# Patient Record
Sex: Male | Born: 1940 | Race: Black or African American | Hispanic: No | Marital: Single | Smoking: Current every day smoker
Health system: Southern US, Community
[De-identification: ages and names within clinical notes are randomized; demographics above are authoritative.]

## PROBLEM LIST (undated history)

## (undated) DIAGNOSIS — E785 Hyperlipidemia, unspecified: Secondary | ICD-10-CM

## (undated) DIAGNOSIS — Z720 Tobacco use: Secondary | ICD-10-CM

## (undated) DIAGNOSIS — E119 Type 2 diabetes mellitus without complications: Secondary | ICD-10-CM

## (undated) DIAGNOSIS — I10 Essential (primary) hypertension: Secondary | ICD-10-CM

## (undated) DIAGNOSIS — J984 Other disorders of lung: Secondary | ICD-10-CM

## (undated) DIAGNOSIS — N400 Enlarged prostate without lower urinary tract symptoms: Secondary | ICD-10-CM

## (undated) DIAGNOSIS — I071 Rheumatic tricuspid insufficiency: Secondary | ICD-10-CM

## (undated) DIAGNOSIS — B351 Tinea unguium: Secondary | ICD-10-CM

## (undated) DIAGNOSIS — L309 Dermatitis, unspecified: Secondary | ICD-10-CM

## (undated) HISTORY — DX: Hyperlipidemia, unspecified: E78.5

## (undated) HISTORY — DX: Essential (primary) hypertension: I10

## (undated) HISTORY — DX: Dermatitis, unspecified: L30.9

## (undated) HISTORY — DX: Rheumatic tricuspid insufficiency: I07.1

## (undated) HISTORY — DX: Other disorders of lung: J98.4

## (undated) HISTORY — DX: Tobacco use: Z72.0

## (undated) HISTORY — DX: Type 2 diabetes mellitus without complications: E11.9

## (undated) HISTORY — DX: Benign prostatic hyperplasia without lower urinary tract symptoms: N40.0

## (undated) HISTORY — DX: Tinea unguium: B35.1

---

## 1992-10-15 DIAGNOSIS — Z720 Tobacco use: Secondary | ICD-10-CM | POA: Insufficient documentation

## 1998-04-27 ENCOUNTER — Ambulatory Visit (HOSPITAL_COMMUNITY): Admission: RE | Admit: 1998-04-27 | Discharge: 1998-04-27 | Payer: Self-pay | Admitting: Internal Medicine

## 1999-10-28 DIAGNOSIS — E785 Hyperlipidemia, unspecified: Secondary | ICD-10-CM | POA: Insufficient documentation

## 2000-11-21 ENCOUNTER — Encounter: Admission: RE | Admit: 2000-11-21 | Discharge: 2000-11-21 | Payer: Self-pay | Admitting: Internal Medicine

## 2000-11-21 ENCOUNTER — Encounter: Payer: Self-pay | Admitting: Internal Medicine

## 2001-02-12 ENCOUNTER — Ambulatory Visit (HOSPITAL_COMMUNITY): Admission: RE | Admit: 2001-02-12 | Discharge: 2001-02-12 | Payer: Self-pay | Admitting: Internal Medicine

## 2002-05-19 ENCOUNTER — Encounter: Payer: Self-pay | Admitting: Internal Medicine

## 2002-05-19 ENCOUNTER — Encounter: Admission: RE | Admit: 2002-05-19 | Discharge: 2002-05-19 | Payer: Self-pay | Admitting: Internal Medicine

## 2002-08-29 ENCOUNTER — Ambulatory Visit (HOSPITAL_COMMUNITY): Admission: RE | Admit: 2002-08-29 | Discharge: 2002-08-29 | Payer: Self-pay | Admitting: Gastroenterology

## 2003-07-17 ENCOUNTER — Encounter: Admission: RE | Admit: 2003-07-17 | Discharge: 2003-07-17 | Payer: Self-pay | Admitting: Internal Medicine

## 2003-09-15 ENCOUNTER — Encounter: Admission: RE | Admit: 2003-09-15 | Discharge: 2003-09-15 | Payer: Self-pay | Admitting: Internal Medicine

## 2003-11-13 ENCOUNTER — Encounter: Admission: RE | Admit: 2003-11-13 | Discharge: 2003-11-13 | Payer: Self-pay | Admitting: Cardiology

## 2003-11-17 ENCOUNTER — Encounter: Admission: RE | Admit: 2003-11-17 | Discharge: 2003-11-17 | Payer: Self-pay | Admitting: Cardiology

## 2003-11-20 HISTORY — PX: CARDIAC CATHETERIZATION: SHX172

## 2003-12-18 ENCOUNTER — Encounter (INDEPENDENT_AMBULATORY_CARE_PROVIDER_SITE_OTHER): Payer: Self-pay | Admitting: Specialist

## 2003-12-18 ENCOUNTER — Ambulatory Visit (HOSPITAL_COMMUNITY): Admission: RE | Admit: 2003-12-18 | Discharge: 2003-12-18 | Payer: Self-pay | Admitting: Urology

## 2004-01-13 DIAGNOSIS — N138 Other obstructive and reflux uropathy: Secondary | ICD-10-CM | POA: Insufficient documentation

## 2004-01-13 DIAGNOSIS — N401 Enlarged prostate with lower urinary tract symptoms: Secondary | ICD-10-CM

## 2004-03-22 ENCOUNTER — Ambulatory Visit (HOSPITAL_COMMUNITY): Admission: RE | Admit: 2004-03-22 | Discharge: 2004-03-22 | Payer: Self-pay | Admitting: Urology

## 2004-09-23 IMAGING — CT CT BIOPSY
1 of 5 series · 12 of 32 positions shown, 18 images · non-contrast
Comparison: none

CLINICAL DATA: Left renal mass.
 CT GUIDED BIOPSY ? 12/18/03
 Written informed consent was obtained from the patient for the procedure and sedation.  The patient received IV Versed and fentanyl for conscious sedation.  Cardiorespiratory monitoring was performed throughout the procedure by the radiology nurse.
 The patient was placed prone on the CT gantry and limited CT was performed to localize the left renal lesion.  On today?s noncontrasted study, the lesion is hyperdense. 
 The left flank was prepped and draped in sterile fashion and anesthetized with 1% lidocaine.  Under CT fluoroscopic guidance, a 19 gauge outer Easy-core needle was advanced to the edge of the renal lesion.  Two separate fine needle aspirations were obtained at this point.  The material aspirated from the lesion was mucus-like and thick.  Quick-stain of these samples showed mucoid material and cystic debris.  Four to five core biopsies were then obtained coaxially.  However, these produced very little material with the samples fragmenting.
 IMPRESSION
 Successful fine needle aspiration and core biopsy of the left renal lesion.

[Series 3: — · axial · 0.68mm/px · z∈[+1244,+1340]mm · 12 of 24 slices shown, 18 images]
[im 2/24  soft-tissue]
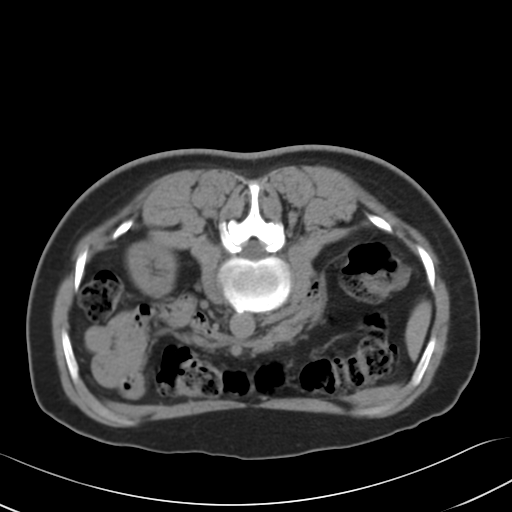
[im 2/24  bone]
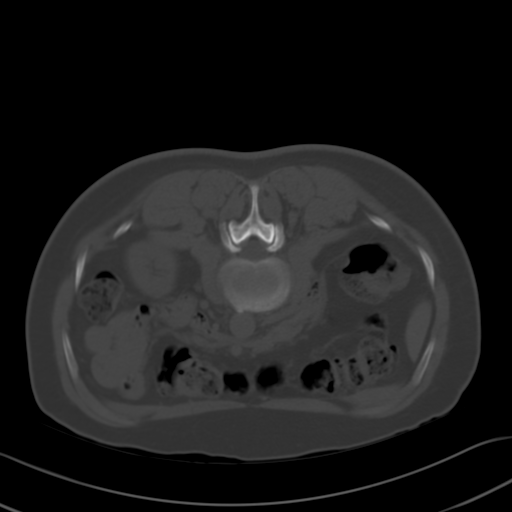
[im 4/24  soft-tissue]
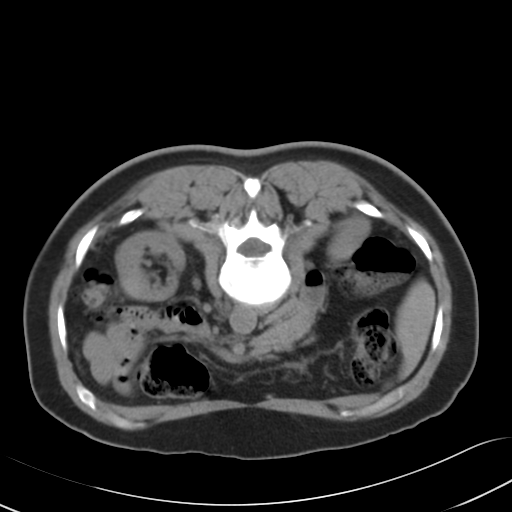
[im 6/24  soft-tissue]
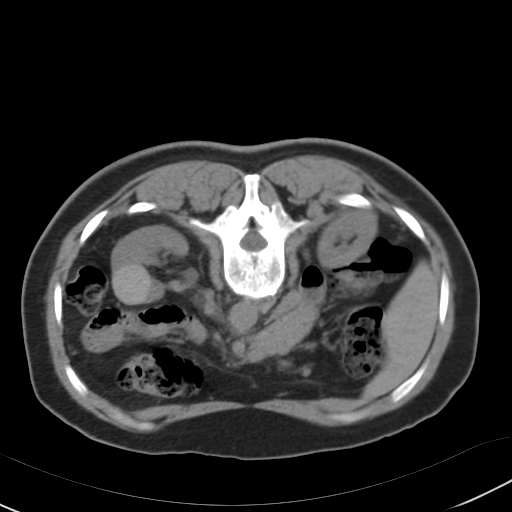
[im 8/24  soft-tissue]
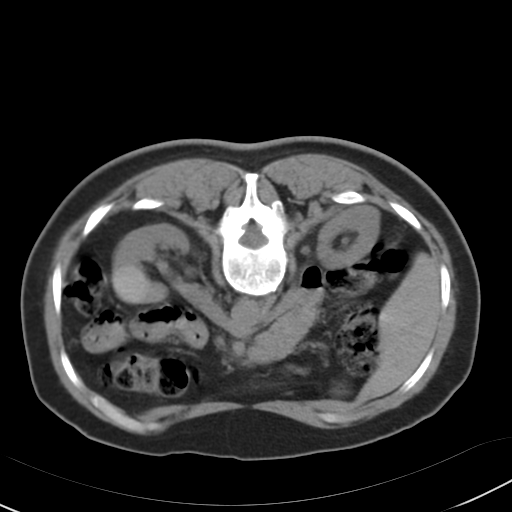
[im 9/24  soft-tissue]
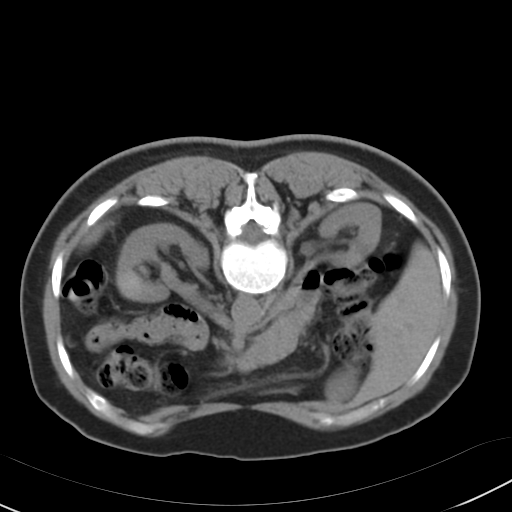
[im 11/24  soft-tissue]
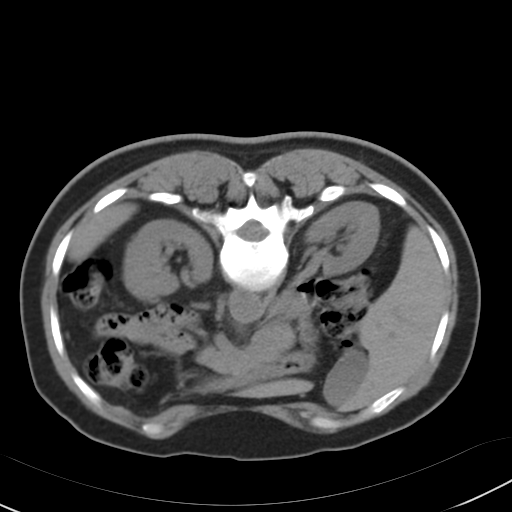
[im 13/24  soft-tissue]
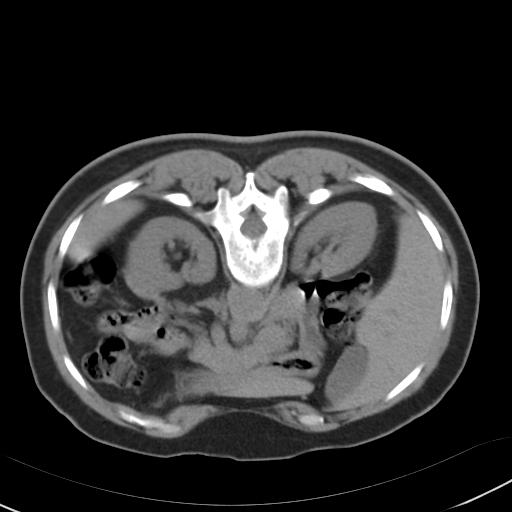
[im 15/24  soft-tissue]
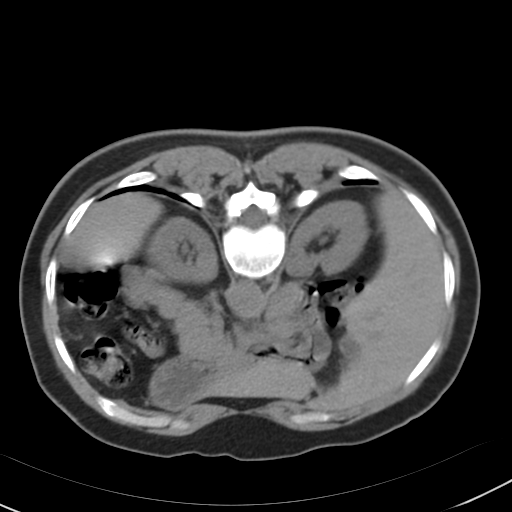
[im 16/24  soft-tissue]
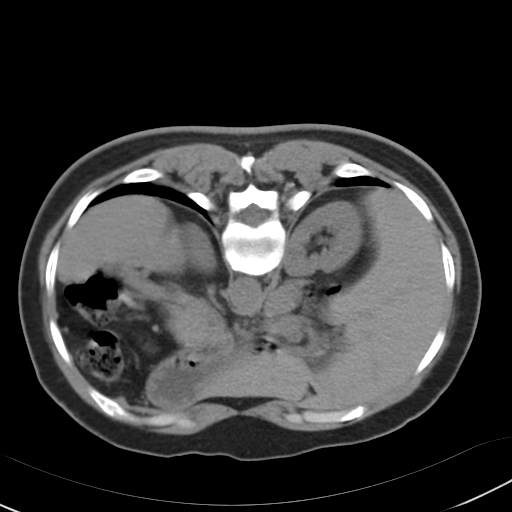
[im 16/24  lung]
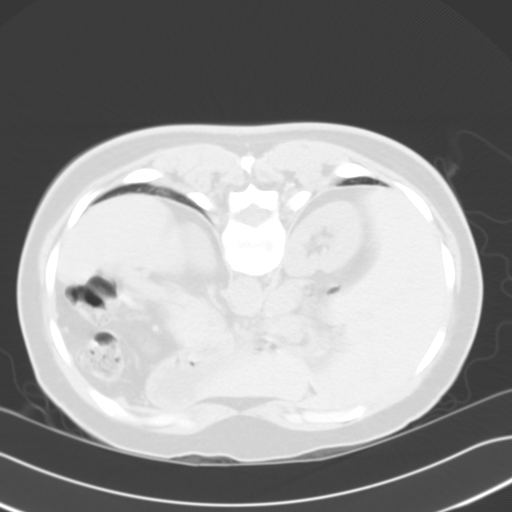
[im 16/24  bone]
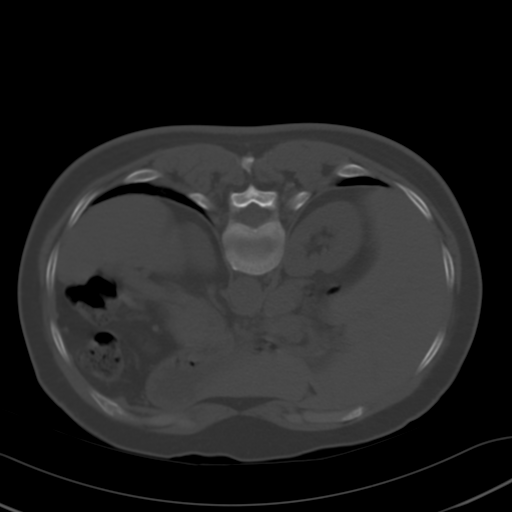
[im 18/24  soft-tissue]
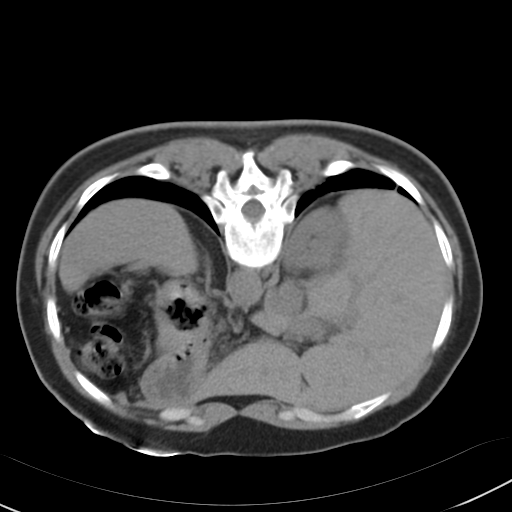
[im 18/24  lung]
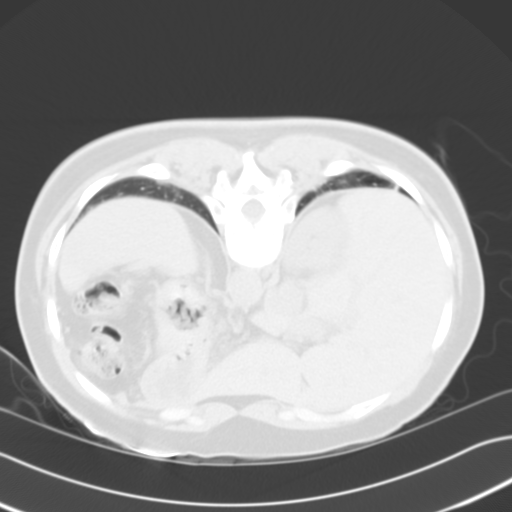
[im 20/24  soft-tissue]
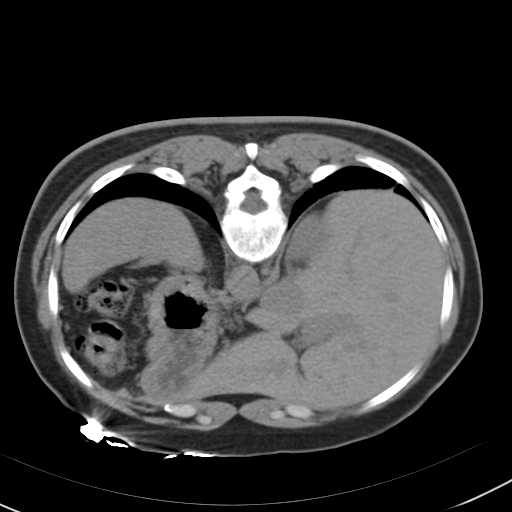
[im 20/24  lung]
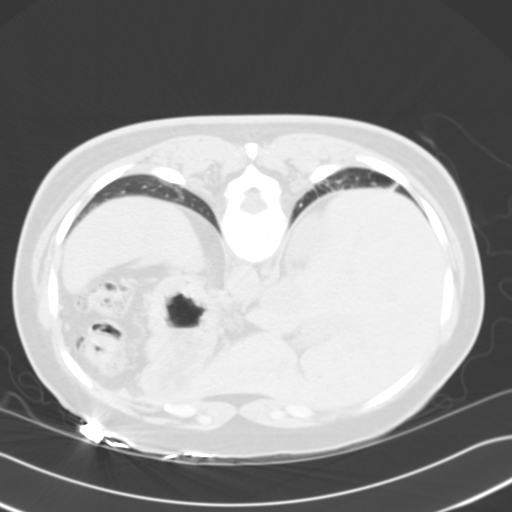
[im 22/24  soft-tissue]
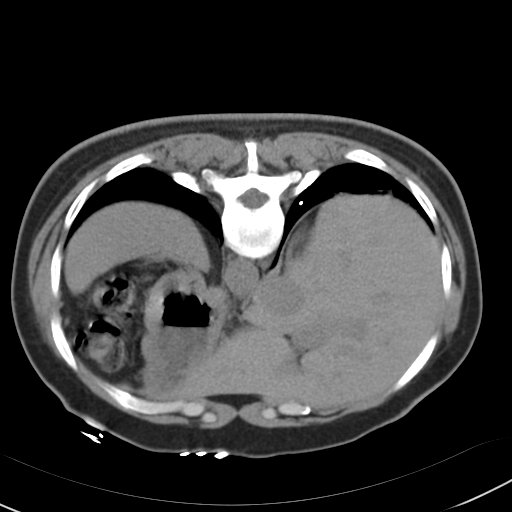
[im 22/24  lung]
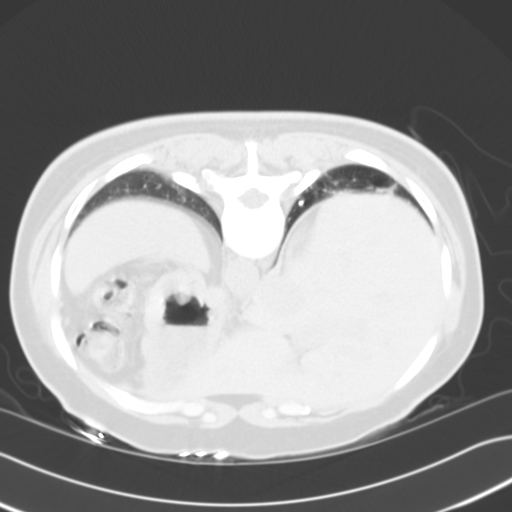

[12 of 32 positions shown; findings below may reference images not displayed]

## 2008-11-18 ENCOUNTER — Encounter: Admission: RE | Admit: 2008-11-18 | Discharge: 2008-11-18 | Payer: Self-pay | Admitting: Internal Medicine

## 2009-08-17 DIAGNOSIS — E559 Vitamin D deficiency, unspecified: Secondary | ICD-10-CM | POA: Insufficient documentation

## 2009-09-23 HISTORY — PX: US ECHOCARDIOGRAPHY: HXRAD669

## 2010-04-14 ENCOUNTER — Ambulatory Visit: Payer: Self-pay | Admitting: Surgery

## 2010-04-14 ENCOUNTER — Encounter: Payer: Self-pay | Admitting: Internal Medicine

## 2010-04-14 ENCOUNTER — Ambulatory Visit (HOSPITAL_COMMUNITY): Admission: RE | Admit: 2010-04-14 | Discharge: 2010-04-14 | Payer: Self-pay | Admitting: Internal Medicine

## 2010-10-02 ENCOUNTER — Encounter: Payer: Self-pay | Admitting: Urology

## 2011-01-13 ENCOUNTER — Encounter: Payer: Self-pay | Admitting: Internal Medicine

## 2011-01-13 DIAGNOSIS — E119 Type 2 diabetes mellitus without complications: Secondary | ICD-10-CM | POA: Insufficient documentation

## 2011-01-13 DIAGNOSIS — I10 Essential (primary) hypertension: Secondary | ICD-10-CM | POA: Insufficient documentation

## 2011-01-27 NOTE — Op Note (Signed)
   NAME:  Chase Mccarthy, Chase Mccarthy NO.:  [id number]   MEDICAL RECORD NO.:  [id number]                   PATIENT TYPE:  AMB   LOCATION:  ENDO                                 FACILITY:  [facility]   PHYSICIAN:  Matilde C. Madilyn Fireman, M.D.                 DATE OF BIRTH:  June 29, 1941   DATE OF PROCEDURE:  08/29/2002  DATE OF DISCHARGE:                                 OPERATIVE REPORT   PROCEDURE:  Colonoscopy.   INDICATIONS FOR PROCEDURE:  Colon cancer screening in a 70 year old patient  with no previous screening.   DESCRIPTION OF PROCEDURE:  The patient was placed in the left lateral  decubitus position then placed on the pulse monitor with continuous low flow  oxygen delivered by nasal cannula. He was sedated with 50 mcg IV fentanyl  and 5 mg IV Versed. The Olympus video colonoscope was inserted into the  rectum and advanced to the cecum, confirmed by transillumination at  McBurney's point and visualization of the ileocecal valve and appendiceal  orifice. The prep was good. The cecum, ascending, transverse, descending and  sigmoid colon all appeared normal with no masses, polyps, diverticula or  other mucosal abnormalities. The rectum likewise appeared normal and  retroflexed view of the anus revealed no obvious internal hemorrhoids. The  colonoscope was then withdrawn and the patient returned to the recovery room  in stable condition. The patient tolerated the procedure well and there were  no immediate complications.   IMPRESSION:  Normal screening colonoscopy.   PLAN:  Repeat study in five years.                                               Ethridge C. Madilyn Fireman, M.D.    [facility]/MEDQ  D:  08/29/2002  T:  08/29/2002  Job:  [id number]   cc:   Minerva Areola L. August Saucer, M.D.  [address] [id number]  [place]  [place] [address]  Fax: [phone]

## 2012-03-27 HISTORY — PX: NM MYOCAR PERF WALL MOTION: HXRAD629

## 2012-09-18 LAB — HM COLONOSCOPY

## 2012-11-14 ENCOUNTER — Encounter: Payer: Self-pay | Admitting: Internal Medicine

## 2012-11-14 DIAGNOSIS — E119 Type 2 diabetes mellitus without complications: Secondary | ICD-10-CM | POA: Insufficient documentation

## 2012-11-14 DIAGNOSIS — I10 Essential (primary) hypertension: Secondary | ICD-10-CM | POA: Insufficient documentation

## 2012-11-14 DIAGNOSIS — B351 Tinea unguium: Secondary | ICD-10-CM | POA: Insufficient documentation

## 2012-11-14 DIAGNOSIS — J984 Other disorders of lung: Secondary | ICD-10-CM | POA: Insufficient documentation

## 2012-11-26 ENCOUNTER — Ambulatory Visit (INDEPENDENT_AMBULATORY_CARE_PROVIDER_SITE_OTHER): Payer: Medicare Other | Admitting: Emergency Medicine

## 2012-11-26 VITALS — BP 117/67 | HR 83 | Temp 97.9°F | Resp 18 | Wt 176.0 lb

## 2012-11-26 DIAGNOSIS — M79609 Pain in unspecified limb: Secondary | ICD-10-CM

## 2012-11-26 DIAGNOSIS — IMO0002 Reserved for concepts with insufficient information to code with codable children: Secondary | ICD-10-CM

## 2012-11-26 DIAGNOSIS — L02511 Cutaneous abscess of right hand: Secondary | ICD-10-CM

## 2012-11-26 MED ORDER — SULFAMETHOXAZOLE-TMP DS 800-160 MG PO TABS: 1.0000 | ORAL_TABLET | Freq: Two times a day (BID) | ORAL | Status: DC

## 2012-11-26 NOTE — Progress Notes (Signed)
Digital block to L thumb.  Betadine prep and #11 blade used to make a very small incision under his nail. Purulence expressed  Drsg placed.

## 2012-11-26 NOTE — Patient Instructions (Signed)
Wound care d/w pt. Continue daily soaks.

## 2012-11-26 NOTE — Progress Notes (Signed)
[facility] [address], [place] [address] [phone]  Date:  11/26/2012   Name:  Chase Mccarthy   DOB:  [birth year]   MRN:  [id number]  PCP:  Willey Blade, MD    Chief Complaint: Hand Pain   History of Present Illness:  Chase Mccarthy is a 72 y.o. very pleasant male patient who presents with the following:  Has a week long history of tender swollen left thumb on the terminal phalange on the radial side. History of similar on the other thumb two years ago.  No known injury. Denies other complaint or health concern today.   Patient Active Problem List  Diagnosis  . Essential hypertension  . Diabetes mellitus type 2 in nonobese  . Tobacco abuse  . Hyperlipidemia LDL goal < 100  . Vitamin D deficiency  . Benign prostatic hypertrophy with urinary obstruction  . Hypertension  . Diabetes mellitus  . Restrictive lung disease  . Onychomycosis    Past Medical History  Diagnosis Date  . Hypertension     stable  . Diabetes mellitus   . Tobacco use   . Restrictive lung disease   . Dermatitis     axillary  . Onychomycosis   . BPH (benign prostatic hyperplasia)     mild    No past surgical history on file.  History  Substance Use Topics  . Smoking status: Current Every Day Smoker -- 20 years    Types: Cigars  . Smokeless tobacco: Never Used     Comment: 3 cigars daily  . Alcohol Use: No    No family history on file.  No Known Allergies  Medication list has been reviewed and updated.  Current Outpatient Prescriptions on File Prior to Visit  Medication Sig Dispense Refill  . amLODipine (NORVASC) 2.5 MG tablet Take 2.5 mg by mouth daily.        Marland Kitchen aspirin 81 MG EC tablet Take 81 mg by mouth daily.        Marland Kitchen dutasteride (AVODART) 0.5 MG capsule Take 0.5 mg by mouth daily.        . fish oil-omega-3 fatty acids 1000 MG capsule Take 2 g by mouth daily.      . fluocinonide (LIDEX) 0.05 % cream Apply 1 application topically 2 (two) times daily.         Marland Kitchen glimepiride (AMARYL) 1 MG tablet Take 1 mg by mouth daily before breakfast.        . lisinopril (PRINIVIL,ZESTRIL) 10 MG tablet Take 10 mg by mouth daily.      Marland Kitchen oxybutynin (DITROPAN) 5 MG tablet Take 5 mg by mouth 3 (three) times daily.        Marland Kitchen terbinafine (LAMISIL) 250 MG tablet Take 250 mg by mouth daily.       No current facility-administered medications on file prior to visit.    Review of Systems:  As per HPI, otherwise negative.    Physical Examination: Filed Vitals:   11/26/12 1701  BP: 117/67  Pulse: 83  Temp: 97.9 F (36.6 C)  Resp: 18   Filed Vitals:   11/26/12 1701  Weight: 176 lb (79.833 kg)   There is no height on file to calculate BMI. Ideal Body Weight:     GEN: WDWN, NAD, Non-toxic, Alert & Oriented x 3 HEENT: Atraumatic, Normocephalic.  Ears and Nose: No external deformity. EXTR: No clubbing/cyanosis/edema NEURO: Normal gait.  PSYCH: Normally interactive. Conversant. Not depressed or anxious  appearing.  Calm demeanor.  RIGHT HAND:  Felon right thumb  Assessment and Plan: Felon right thumb I&D   septra

## 2013-06-23 ENCOUNTER — Ambulatory Visit: Payer: Self-pay | Admitting: Cardiovascular Disease

## 2013-07-11 ENCOUNTER — Ambulatory Visit: Payer: Self-pay | Admitting: Cardiovascular Disease

## 2013-08-21 ENCOUNTER — Encounter: Payer: Self-pay | Admitting: *Deleted

## 2013-08-22 ENCOUNTER — Ambulatory Visit (INDEPENDENT_AMBULATORY_CARE_PROVIDER_SITE_OTHER): Payer: Medicare Other | Admitting: Cardiovascular Disease

## 2013-08-22 ENCOUNTER — Encounter: Payer: Self-pay | Admitting: Cardiovascular Disease

## 2013-08-22 VITALS — BP 120/70 | HR 60 | Resp 16 | Ht 72.0 in | Wt 169.0 lb

## 2013-08-22 DIAGNOSIS — Z720 Tobacco use: Secondary | ICD-10-CM

## 2013-08-22 DIAGNOSIS — F17200 Nicotine dependence, unspecified, uncomplicated: Secondary | ICD-10-CM

## 2013-08-22 DIAGNOSIS — E119 Type 2 diabetes mellitus without complications: Secondary | ICD-10-CM

## 2013-08-22 DIAGNOSIS — E785 Hyperlipidemia, unspecified: Secondary | ICD-10-CM

## 2013-08-22 NOTE — Patient Instructions (Signed)
Your physician recommends that you schedule a follow-up appointment in: 6 months. Your physician discussed the hazards of tobacco use. Tobacco use cessation is recommended and techniques and options to help you quit were discussed.

## 2013-09-05 ENCOUNTER — Encounter: Payer: Self-pay | Admitting: Cardiovascular Disease

## 2013-09-05 NOTE — Assessment & Plan Note (Addendum)
Fair lipid control. His LDL cholesterol is very close to goal and institution of pharmacological therapy does not appear to be justified at this time especially since he does not have coronary disease by previous angiography (2005). On the other hand his lipids were better about a year ago and the difference appears to be less attention to a healthy diet

## 2013-09-05 NOTE — Assessment & Plan Note (Signed)
Excellent blood pressure control 

## 2013-09-05 NOTE — Assessment & Plan Note (Signed)
Excellent control.   

## 2013-09-05 NOTE — Progress Notes (Signed)
Patient ID: Chase Mccarthy, male   DOB: March 03, 1941, 72 y.o.   MRN: [id number]     Reason for office visit Multiple coronary risk factors  Mr. Kropf has not had any serious health challenges since his last office visit. He continues to be bleeding and his diabetes control is excellent. His blood pressure is also very good. A repeat lipid profile was performed last month. The results are not too bad with an LDL cholesterol of 105 and normal total cholesterol triglycerides and HDL levels. On the other hand in 2012 his LDL has been as low as 77. He admits he has not been quite as careful with his diet.   No Known Allergies  Current Outpatient Prescriptions  Medication Sig Dispense Refill  . amLODipine (NORVASC) 2.5 MG tablet Take 2.5 mg by mouth daily.        Marland Kitchen aspirin 81 MG EC tablet Take 81 mg by mouth daily.        Marland Kitchen dutasteride (AVODART) 0.5 MG capsule Take 0.5 mg by mouth daily.        . fish oil-omega-3 fatty acids 1000 MG capsule Take 2 g by mouth daily.      . fluocinonide (LIDEX) 0.05 % cream Apply 1 application topically 2 (two) times daily.        Marland Kitchen glimepiride (AMARYL) 1 MG tablet Take 1 mg by mouth daily before breakfast.        . lisinopril (PRINIVIL,ZESTRIL) 10 MG tablet Take 10 mg by mouth daily.      Marland Kitchen oxybutynin (DITROPAN) 5 MG tablet Take 5 mg by mouth 3 (three) times daily.        Marland Kitchen terbinafine (LAMISIL) 250 MG tablet Take 250 mg by mouth daily.       No current facility-administered medications for this visit.    Past Medical History  Diagnosis Date  . Hypertension     stable  . Diabetes mellitus   . Tobacco use   . Restrictive lung disease   . Dermatitis     axillary  . Onychomycosis   . BPH (benign prostatic hyperplasia)     mild  . Dyslipidemia   . Tricuspid valve regurgitation     Past Surgical History  Procedure Laterality Date  . Cardiac catheterization  11/20/2003    normal coronaries  . US echocardiography  09/23/2009    mild-mod LVH,EF =>55%,mild  MVP,trace MR,mild to mod TR, AOV mildly sclerotic.  Marland Kitchen Nm myocar perf wall motion  03/27/2012    normal    Family History  Problem Relation Age of Onset  . Heart failure Mother   . Cancer Father     History   Social History  . Marital Status: Single    Spouse Name: N/A    Number of Children: N/A  . Years of Education: N/A   Occupational History  . Not on file.   Social History Main Topics  . Smoking status: Current Every Day Smoker -- 20 years    Types: Cigars  . Smokeless tobacco: Never Used     Comment: 3 cigars daily  . Alcohol Use: No  . Drug Use: No  . Sexual Activity: Not Currently   Other Topics Concern  . Not on file   Social History Narrative  . No narrative on file    Review of systems: The patient specifically denies any chest pain at rest or with exertion, dyspnea at rest or with exertion, orthopnea, paroxysmal nocturnal dyspnea, syncope, palpitations, focal  neurological deficits, intermittent claudication, lower extremity edema, unexplained weight gain, cough, hemoptysis or wheezing.  The patient also denies abdominal pain, nausea, vomiting, dysphagia, diarrhea, constipation, polyuria, polydipsia, dysuria, hematuria, frequency, urgency, abnormal bleeding or bruising, fever, chills, unexpected weight changes, mood swings, change in skin or hair texture, change in voice quality, auditory or visual problems, allergic reactions or rashes, new musculoskeletal complaints other than usual "aches and pains".   PHYSICAL EXAM BP 120/70  Pulse 60  Resp 16  Ht 6' (1.829 m)  Wt 169 lb (76.658 kg)  BMI 22.92 kg/m2  General: Alert, oriented x3, no distress Head: no evidence of trauma, PERRL, EOMI, no exophtalmos or lid lag, no myxedema, no xanthelasma; normal ears, nose and oropharynx Neck: normal jugular venous pulsations and no hepatojugular reflux; brisk carotid pulses without delay and no carotid bruits Chest: clear to auscultation, no signs of consolidation by  percussion or palpation, normal fremitus, symmetrical and full respiratory excursions Cardiovascular: normal position and quality of the apical impulse, regular rhythm, normal first and second heart sounds, no murmurs, rubs or gallops Abdomen: no tenderness or distention, no masses by palpation, no abnormal pulsatility or arterial bruits, normal bowel sounds, no hepatosplenomegaly Extremities: no clubbing, cyanosis or edema; 2+ radial, ulnar and brachial pulses bilaterally; 2+ right femoral, posterior tibial and dorsalis pedis pulses; 2+ left femoral, posterior tibial and dorsalis pedis pulses; no subclavian or femoral bruits Neurological: grossly nonfocal   EKG: Normal sinus rhythm, normal trach  Lipid Panel  Total cholesterol 181, triglycerides 122, HDL 52, LDL 105  BMET Sodium 137, potassium 3.7, glucose 114, creatinine 1.05 Hemoglobin A1c 6% Labs from 07/25/2013   ASSESSMENT AND PLAN Tobacco abuse Strongly encouraged to completely discontinue smoking due to the risk of cardiac and lung problems and increased risk of head and neck cancer.  Hyperlipidemia LDL goal < 100 Fair lipid control. His LDL cholesterol is very close to goal and institution of additional therapy does not appear to be justified at this time especially since he does not have coronary disease by previous angiography (2005).  Diabetes mellitus Excellent control  Essential hypertension Excellent blood pressure control   Patient Instructions  Your physician recommends that you schedule a follow-up appointment in: 6 months. Your physician discussed the hazards of tobacco use. Tobacco use cessation is recommended and techniques and options to help you quit were discussed.       Junious Silk, MD, [facility] [phone] office [phone] pager

## 2013-09-05 NOTE — Assessment & Plan Note (Signed)
Strongly encouraged to completely discontinue smoking due to the risk of cardiac and lung problems and increased risk of head and neck cancer.

## 2013-10-19 ENCOUNTER — Ambulatory Visit: Payer: Medicare HMO

## 2013-10-19 ENCOUNTER — Ambulatory Visit (INDEPENDENT_AMBULATORY_CARE_PROVIDER_SITE_OTHER): Payer: Medicare HMO | Admitting: Emergency Medicine

## 2013-10-19 VITALS — BP 108/64 | HR 68 | Temp 97.9°F | Resp 18 | Ht 72.0 in | Wt 165.2 lb

## 2013-10-19 DIAGNOSIS — M25579 Pain in unspecified ankle and joints of unspecified foot: Secondary | ICD-10-CM

## 2013-10-19 DIAGNOSIS — M79609 Pain in unspecified limb: Secondary | ICD-10-CM

## 2013-10-19 DIAGNOSIS — E119 Type 2 diabetes mellitus without complications: Secondary | ICD-10-CM

## 2013-10-19 DIAGNOSIS — M79676 Pain in unspecified toe(s): Secondary | ICD-10-CM

## 2013-10-19 DIAGNOSIS — S92919A Unspecified fracture of unspecified toe(s), initial encounter for closed fracture: Secondary | ICD-10-CM

## 2013-10-19 LAB — POCT CBC
Granulocyte percent: 54.3 %G (ref 37–80)
HEMATOCRIT: 42.8 % — AB (ref 43.5–53.7)
HEMOGLOBIN: 13.2 g/dL — AB (ref 14.1–18.1)
LYMPH, POC: 1.7 (ref 0.6–3.4)
MCH, POC: 29.3 pg (ref 27–31.2)
MCHC: 30.8 g/dL — AB (ref 31.8–35.4)
MCV: 95 fL (ref 80–97)
MID (CBC): 0.5 (ref 0–0.9)
MPV: 9 fL (ref 0–99.8)
POC GRANULOCYTE: 2.6 (ref 2–6.9)
POC LYMPH PERCENT: 34.9 %L (ref 10–50)
POC MID %: 10.8 % (ref 0–12)
Platelet Count, POC: 242 10*3/uL (ref 142–424)
RBC: 4.51 M/uL — AB (ref 4.69–6.13)
RDW, POC: 13.6 %
WBC: 4.8 10*3/uL (ref 4.6–10.2)

## 2013-10-19 NOTE — Patient Instructions (Signed)
Toe Fracture  Your caregiver has diagnosed you as having a fractured toe. A toe fracture is a break in the bone of a toe. "Buddy taping" is a way of splinting your broken toe, by taping the broken toe to the toe next to it. This "buddy taping" will keep the injured toe from moving beyond normal range of motion. Buddy taping also helps the toe heal in a more normal alignment. It may take 6 to 8 weeks for the toe injury to heal.  HOME CARE INSTRUCTIONS    Leave your toes taped together for as long as directed by your caregiver or until you see a doctor for a follow-up examination. You can change the tape after bathing. Always use a small piece of gauze or cotton between the toes when taping them together. This will help the skin stay dry and prevent infection.   Apply ice to the injury for 15-20 minutes each hour while awake for the first 2 days. Put the ice in a plastic bag and place a towel between the bag of ice and your skin.   After the first 2 days, apply heat to the injured area. Use heat for the next 2 to 3 days. Place a heating pad on the foot or soak the foot in warm water as directed by your caregiver.   Keep your foot elevated as much as possible to lessen swelling.   Wear sturdy, supportive shoes. The shoes should not pinch the toes or fit tightly against the toes.   Your caregiver may prescribe a rigid shoe if your foot is very swollen.   Your may be given crutches if the pain is too great and it hurts too much to walk.   Only take over-the-counter or prescription medicines for pain, discomfort, or fever as directed by your caregiver.   If your caregiver has given you a follow-up appointment, it is very important to keep that appointment. Not keeping the appointment could result in a chronic or permanent injury, pain, and disability. If there is any problem keeping the appointment, you must call back to this facility for assistance.  SEEK MEDICAL CARE IF:    You have increased pain or swelling,  not relieved with medications.   The pain does not get better after 1 week.   Your injured toe is cold when the others are warm.  SEEK IMMEDIATE MEDICAL CARE IF:    The toe becomes cold, numb, or white.   The toe becomes hot (inflamed) and red.  Document Released: 08/25/2000 Document Revised: 11/20/2011 Document Reviewed: 04/13/2008  [employer] Patient Information 2014 [employer]

## 2013-10-19 NOTE — Progress Notes (Addendum)
Subjective:    Patient ID: Chase Mccarthy, male    DOB: April 24, 1941, 73 y.o.   MRN: [id number]  HPI This chart was scribed for Viviann Spare Charnita Trudel-MD, by Ladona Ridgel Day, Scribe. This patient was seen in room 2 and the patient's care was started at 4:32 PM.  HPI Comments: Chase Mccarthy is a 73 y.o. male who presents to the [facility] complaining of constant, gradually worsening swelling to his right which he attributes to blunt trauma about a week ago when he accidentally banged it on a hard object. He reports it doesn't feel like gout. He denies fever/chills. He is diabetic.   Patient Active Problem List   Diagnosis Date Noted  . Hypertension   . Diabetes mellitus   . Restrictive lung disease   . Onychomycosis   . Essential hypertension 01/13/2011    Class: Chronic  . Diabetes mellitus type 2 in nonobese 01/13/2011  . Vitamin D deficiency 08/17/2009    Class: History of  . Benign prostatic hypertrophy with urinary obstruction 01/13/2004    Class: Chronic  . Hyperlipidemia LDL goal < 100 10/28/1999    Class: History of  . Tobacco abuse 10/15/1992    Class: Chronic   Past Surgical History  Procedure Laterality Date  . Cardiac catheterization  11/20/2003    normal coronaries  . US echocardiography  09/23/2009    mild-mod LVH,EF =>55%,mild MVP,trace MR,mild to mod TR, AOV mildly sclerotic.  Marland Kitchen Nm myocar perf wall motion  03/27/2012    normal   Family History  Problem Relation Age of Onset  . Heart failure Mother   . Cancer Father    History   Social History  . Marital Status: Single    Spouse Name: N/A    Number of Children: N/A  . Years of Education: N/A   Occupational History  . Not on file.   Social History Main Topics  . Smoking status: Current Every Day Smoker -- 20 years    Types: Cigars  . Smokeless tobacco: Never Used     Comment: 3 cigars daily  . Alcohol Use: No  . Drug Use: No  . Sexual Activity: Not Currently   Other Topics Concern  . Not on  file   Social History Narrative  . No narrative on file   No Known Allergies  Results for orders placed in visit on 11/14/12  HM COLONOSCOPY      Result Value Range   HM Colonoscopy       Value: One 6 mm polyp in rectum and removed with hot snare.   Review of Systems  Constitutional: Negative for fever and chills.  Respiratory: Negative for cough and shortness of breath.   Cardiovascular: Negative for chest pain.  Gastrointestinal: Negative for abdominal pain.  Musculoskeletal: Negative for back pain.       Right foot swelling      Objective:   Physical Exam Nursing note and vitals reviewed. Constitutional: Patient is oriented to person, place, and time. Patient appears well-developed and well-nourished. No distress.  HENT:  Head: Normocephalic and atraumatic.  Neck: Neck supple. No tracheal deviation present.  Cardiovascular: Normal rate, regular rhythm and normal heart sounds.   No murmur heard. Pulmonary/Chest: Effort normal and breath sounds normal. No respiratory distress. Patient has no wheezes. Patient has no rales.  Musculoskeletal: Normal range of motion.  Neurological: Patient is alert and oriented to person, place, and time.  Skin: Skin is warm and dry.  Psychiatric: Patient has a normal mood and affect. Patient's behavior is normal.  Results for orders placed in visit on 10/19/13  POCT CBC      Result Value Range   WBC 4.8  4.6 - 10.2 K/uL   Lymph, poc 1.7  0.6 - 3.4   POC LYMPH PERCENT 34.9  10 - 50 %L   MID (cbc) 0.5  0 - 0.9   POC MID % 10.8  0 - 12 %M   POC Granulocyte 2.6  2 - 6.9   Granulocyte percent 54.3  37 - 80 %G   RBC 4.51 (*) 4.69 - 6.13 M/uL   Hemoglobin 13.2 (*) 14.1 - 18.1 g/dL   HCT, POC 99.342.8 (*) 71.643.5 - 53.7 %   MCV 95.0  80 - 97 fL   MCH, POC 29.3  27 - 31.2 pg   MCHC 30.8 (*) 31.8 - 35.4 g/dL   RDW, POC 96.713.6     Platelet Count, POC 242  142 - 424 K/uL   MPV 9.0  0 - 99.8 fL   Triage Vitals: BP 108/64  Pulse 68  Temp(Src) 97.9 F  (36.6 C) (Oral)  Resp 18  Ht 6' (1.829 m)  Wt 165 lb 3.2 oz (74.934 kg)  BMI 22.40 kg/m2  SpO2 99%  DIAGNOSTIC STUDIES: Oxygen Saturation is 99% on room air, normal by my interpretation.    COORDINATION OF CARE: At 415 PM Discussed treatment plan with patient which includes blood work, right foot X-ray. Patient agrees.  [facility] reading (PRIMARY) by  Dr.  Cleta Albertsaub there is a nondisplaced intra-articular vertical fracture through the proximal phalanx of the great toe no other fractures are seen in the ankle or the foot      Assessment & Plan:   Patient has a fracture of the proximal phalanx of the great toe. Would treat with elevation postop shoe advise repeat x-ray in 7 days. I advised him to keep his leg elevated as much as possible. I gave him a work note so he would not be on his feet so much. The toes were warm with normal filling .

## 2013-10-20 LAB — BASIC METABOLIC PANEL
BUN: 13 mg/dL (ref 6–23)
CO2: 29 meq/L (ref 19–32)
Calcium: 9 mg/dL (ref 8.4–10.5)
Chloride: 106 mEq/L (ref 96–112)
Creat: 1.1 mg/dL (ref 0.50–1.35)
GLUCOSE: 97 mg/dL (ref 70–99)
POTASSIUM: 4 meq/L (ref 3.5–5.3)
SODIUM: 142 meq/L (ref 135–145)

## 2013-10-20 LAB — URIC ACID: URIC ACID, SERUM: 5.6 mg/dL (ref 4.0–7.8)

## 2013-10-21 ENCOUNTER — Telehealth: Payer: Self-pay | Admitting: Emergency Medicine

## 2013-10-21 NOTE — Telephone Encounter (Signed)
Patient called stating he had a missed call from doctor Cleta AlbertsDaub  Stated he can be reached at [phone]

## 2013-10-21 NOTE — Telephone Encounter (Signed)
Caucasian let him know he does not have gout

## 2013-10-21 NOTE — Telephone Encounter (Signed)
See labs. Let pt know of his labs

## 2013-10-24 ENCOUNTER — Ambulatory Visit: Payer: Medicare HMO

## 2013-10-24 ENCOUNTER — Ambulatory Visit (INDEPENDENT_AMBULATORY_CARE_PROVIDER_SITE_OTHER): Payer: Medicare HMO | Admitting: Emergency Medicine

## 2013-10-24 VITALS — BP 120/60 | HR 54 | Temp 98.0°F | Resp 16 | Ht 72.0 in | Wt 165.0 lb

## 2013-10-24 DIAGNOSIS — M79674 Pain in right toe(s): Secondary | ICD-10-CM

## 2013-10-24 DIAGNOSIS — S92919A Unspecified fracture of unspecified toe(s), initial encounter for closed fracture: Secondary | ICD-10-CM

## 2013-10-24 DIAGNOSIS — M79609 Pain in unspecified limb: Secondary | ICD-10-CM

## 2013-10-24 NOTE — Progress Notes (Signed)
   Subjective:    Patient ID: Chase Mccarthy, male    DOB: [birth year], 73 y.o.   MRN: [id number]  HPI patient had a followup vertical fracture of the proximal phalanx of the great toe. The swelling of his foot is significantly better since he has not been working and keeping his foot elevated. He continues to have pain in his toe but his foot is feeling better and the swelling is much reduced the    Review of Systems     Objective:   Physical Exam patient still has significantly swollen foot and great toe. There is ecchymoses over the proximal portion of the great toe  [facility] reading (PRIMARY) by  Dr Cleta Albertsaub no change in alignment of his great toe fracture.        Assessment & Plan:   Continue to buddy tape the great toe. Continue to elevate whenever possible. Continue in a postop shoe. Recheck in 8 days.

## 2014-03-19 ENCOUNTER — Ambulatory Visit: Payer: Medicare Other | Admitting: Cardiovascular Disease

## 2014-05-01 ENCOUNTER — Ambulatory Visit: Payer: Medicare Other | Admitting: Cardiovascular Disease

## 2014-06-05 ENCOUNTER — Ambulatory Visit: Payer: Medicare Other | Admitting: Cardiovascular Disease

## 2014-06-29 ENCOUNTER — Ambulatory Visit: Payer: Medicare Other | Admitting: Cardiovascular Disease

## 2014-08-14 ENCOUNTER — Encounter: Payer: Self-pay | Admitting: Cardiovascular Disease

## 2014-08-14 ENCOUNTER — Ambulatory Visit (INDEPENDENT_AMBULATORY_CARE_PROVIDER_SITE_OTHER): Payer: Medicare HMO | Admitting: Cardiovascular Disease

## 2014-08-14 VITALS — BP 111/64 | HR 59 | Resp 16 | Ht 72.0 in | Wt 169.0 lb

## 2014-08-14 DIAGNOSIS — I10 Essential (primary) hypertension: Secondary | ICD-10-CM

## 2014-08-14 DIAGNOSIS — Z720 Tobacco use: Secondary | ICD-10-CM

## 2014-08-14 DIAGNOSIS — E785 Hyperlipidemia, unspecified: Secondary | ICD-10-CM

## 2014-08-14 DIAGNOSIS — J984 Other disorders of lung: Secondary | ICD-10-CM

## 2014-08-14 NOTE — Progress Notes (Signed)
Patient ID: Chase Mccarthy, male   DOB: [birth year], 73 y.o.   MRN: [id number]     Reason for office visit HTN, hyperlipidemia  Chase Mccarthy has not had any serious health challenges since his last office visit. He continues to be lean, active and works full time. His diabetes control is excellent. His blood pressure is also very good. A repeat lipid profile was performed last month. Has not had a lipid profile this year. Did not tolerate oxybutinin.  No Known Allergies  Current Outpatient Prescriptions  Medication Sig Dispense Refill  . amLODipine (NORVASC) 2.5 MG tablet Take 2.5 mg by mouth daily.      Marland Kitchen. aspirin 81 MG EC tablet Take 81 mg by mouth daily.      Marland Kitchen. dutasteride (AVODART) 0.5 MG capsule Take 0.5 mg by mouth daily.      . fish oil-omega-3 fatty acids 1000 MG capsule Take 2 g by mouth daily.    . fluocinonide (LIDEX) 0.05 % cream Apply 1 application topically 2 (two) times daily.      Marland Kitchen. FLUZONE HIGH-DOSE 0.5 ML SUSY   0  . glimepiride (AMARYL) 1 MG tablet Take 1 mg by mouth daily before breakfast.      . terbinafine (LAMISIL) 250 MG tablet Take 250 mg by mouth daily.     No current facility-administered medications for this visit.    Past Medical History  Diagnosis Date  . Hypertension     stable  . Diabetes mellitus   . Tobacco use   . Restrictive lung disease   . Dermatitis     axillary  . Onychomycosis   . BPH (benign prostatic hyperplasia)     mild  . Dyslipidemia   . Tricuspid valve regurgitation     Past Surgical History  Procedure Laterality Date  . Cardiac catheterization  11/20/2003    normal coronaries  . [place] echocardiography  09/23/2009    mild-mod LVH,EF =>55%,mild MVP,trace MR,mild to mod TR, AOV mildly sclerotic.  Marland Kitchen. Nm myocar perf wall motion  03/27/2012    normal    Family History  Problem Relation Age of Onset  . Heart failure Mother   . Cancer Father     History   Social History  . Marital Status: Single    Spouse Name: N/A    Number of  Children: N/A  . Years of Education: N/A   Occupational History  . Not on file.   Social History Main Topics  . Smoking status: Current Every Day Smoker -- 20 years    Types: Cigars  . Smokeless tobacco: Never Used     Comment: 3 cigars daily  . Alcohol Use: No  . Drug Use: No  . Sexual Activity: Not Currently   Other Topics Concern  . Not on file   Social History Narrative    Review of systems: The patient specifically denies any chest pain at rest or with exertion, dyspnea at rest or with exertion, orthopnea, paroxysmal nocturnal dyspnea, syncope, palpitations, focal neurological deficits, intermittent claudication, lower extremity edema, unexplained weight gain, cough, hemoptysis or wheezing.  The patient also denies abdominal pain, nausea, vomiting, dysphagia, diarrhea, constipation, polyuria, polydipsia, dysuria, hematuria, frequency, urgency, abnormal bleeding or bruising, fever, chills, unexpected weight changes, mood swings, change in skin or hair texture, change in voice quality, auditory or visual problems, allergic reactions or rashes, new musculoskeletal complaints other than usual "aches and pains".   PHYSICAL EXAM BP 111/64 mmHg  Pulse 59  Resp  16  Ht 6' (1.829 m)  Wt 169 lb (76.658 kg)  BMI 22.92 kg/m2  General: Alert, oriented x3, no distress Head: no evidence of trauma, PERRL, EOMI, no exophtalmos or lid lag, no myxedema, no xanthelasma; normal ears, nose and oropharynx Neck: normal jugular venous pulsations and no hepatojugular reflux; brisk carotid pulses without delay and no carotid bruits Chest: clear to auscultation, no signs of consolidation by percussion or palpation, normal fremitus, symmetrical and full respiratory excursions Cardiovascular: normal position and quality of the apical impulse, regular rhythm, normal first and second heart sounds, no murmurs, rubs or gallops Abdomen: no tenderness or distention, no masses by palpation, no abnormal  pulsatility or arterial bruits, normal bowel sounds, no hepatosplenomegaly Extremities: no clubbing, cyanosis or edema; 2+ radial, ulnar and brachial pulses bilaterally; 2+ right femoral, posterior tibial and dorsalis pedis pulses; 2+ left femoral, posterior tibial and dorsalis pedis pulses; no subclavian or femoral bruits Neurological: grossly nonfocal   EKG: NSR, normal  Lipid Panel  No results found for: CHOL, TRIG, HDL, CHOLHDL, VLDL, LDLCALC, LDLDIRECT  BMET    Component Value Date/Time   NA 142 10/19/2013 1627   K 4.0 10/19/2013 1627   CL 106 10/19/2013 1627   CO2 29 10/19/2013 1627   GLUCOSE 97 10/19/2013 1627   BUN 13 10/19/2013 1627   CREATININE 1.10 10/19/2013 1627   CALCIUM 9.0 10/19/2013 1627     ASSESSMENT AND PLAN  Congratulated him on his healthy lifestyle. Repeat lipid profile. F/U in one year.  Orders Placed This Encounter  Procedures  . Lipid panel  . EKG 12-Lead   Meds ordered this encounter  Medications  . FLUZONE HIGH-DOSE 0.5 ML SUSY    Sig:     Refill:  0    Nirav Sweda  Thurmon FairMihai Jerrion Tabbert, MD, [facility] [phone] office [phone] pager

## 2014-08-14 NOTE — Patient Instructions (Signed)
  We will see you back in follow up in 1 year with Dr Royann Shiversroitoru.   Dr Royann Shiversroitoru has ordered:  A FASTING lipid profile: to be done at your convenience.  [facility] on the first floor of this building, [address].  They are open from 8am-5pm with a lunch from 12-2.  You do not need an appointment.

## 2014-08-16 ENCOUNTER — Encounter: Payer: Self-pay | Admitting: Cardiovascular Disease

## 2014-08-18 ENCOUNTER — Telehealth: Payer: Self-pay | Admitting: *Deleted

## 2014-08-18 DIAGNOSIS — Z79899 Other long term (current) drug therapy: Secondary | ICD-10-CM

## 2014-08-18 DIAGNOSIS — E7800 Pure hypercholesterolemia, unspecified: Secondary | ICD-10-CM

## 2014-08-18 LAB — LIPID PANEL
CHOL/HDL RATIO: 3.5 ratio
Cholesterol: 188 mg/dL (ref 0–200)
HDL: 53 mg/dL (ref >39)
LDL CALC: 114 mg/dL — AB (ref 0–99)
Triglycerides: 104 mg/dL (ref <150)
VLDL: 21 mg/dL (ref 0–40)

## 2014-08-18 MED ORDER — ATORVASTATIN CALCIUM 10 MG PO TABS: 10.0000 mg | ORAL_TABLET | Freq: Every day | ORAL | Status: DC

## 2014-08-18 NOTE — Telephone Encounter (Signed)
-----   Message from Thurmon FairMihai Croitoru, MD sent at 08/18/2014  8:20 AM EST ----- LDL-C is too high. I recommend he starts pravastatin 40 mg daily at bedtime (alternative atorvastatin 10 mg daily if he prefers) and recheck labs in 3 months.

## 2014-08-18 NOTE — Telephone Encounter (Signed)
Patient notified of lab results and will start Atorvastatin 10mg  daily.  [id number] w/[facility] refills.  Labs to be rechecked in 3 months.  Order mailed to patient.  Patient voiced understanding.

## 2015-08-16 ENCOUNTER — Ambulatory Visit: Payer: Medicare HMO | Admitting: Cardiovascular Disease

## 2015-10-15 ENCOUNTER — Ambulatory Visit (INDEPENDENT_AMBULATORY_CARE_PROVIDER_SITE_OTHER): Payer: Medicare HMO | Admitting: Cardiovascular Disease

## 2015-10-15 ENCOUNTER — Encounter: Payer: Self-pay | Admitting: Cardiovascular Disease

## 2015-10-15 VITALS — BP 112/68 | HR 66 | Ht 72.0 in | Wt 170.5 lb

## 2015-10-15 DIAGNOSIS — Z720 Tobacco use: Secondary | ICD-10-CM

## 2015-10-15 DIAGNOSIS — I10 Essential (primary) hypertension: Secondary | ICD-10-CM

## 2015-10-15 DIAGNOSIS — Z79899 Other long term (current) drug therapy: Secondary | ICD-10-CM

## 2015-10-15 DIAGNOSIS — R9431 Abnormal electrocardiogram [ECG] [EKG]: Secondary | ICD-10-CM

## 2015-10-15 DIAGNOSIS — E785 Hyperlipidemia, unspecified: Secondary | ICD-10-CM

## 2015-10-15 DIAGNOSIS — E119 Type 2 diabetes mellitus without complications: Secondary | ICD-10-CM | POA: Diagnosis not present

## 2015-10-15 NOTE — Progress Notes (Signed)
Patient ID: Chase Mccarthy, male   DOB: May 11, 1941, 75 y.o.   MRN: [id number]    Cardiology Office Note    Date:  10/15/2015   ID:  Chase Mccarthy, DOB 1941/07/13, MRN [id number]  PCP:  Chase Blade, MD  Cardiologist:   Chase Fair, MD   Chief Complaint  Patient presents with  . Annual Exam     no chest pain, no shortness of breath, no edema, no pain or cramping in legs, no lightheadedness or dizziness    History of Present Illness:  Chase Mccarthy is a 75 y.o. male with a history of non-insulin requiring diabetes mellitus, essential hypertension, hyperlipidemia and cigar smoking. 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 He feels great and denies any problems with shortness of breath, dizziness, palpitations, syncope, angina, lower extremity edema or claudication. He denies any focal neurological events. He still smokes an occasional cigar but "does not inhale".    Past Medical History  Diagnosis Date  . Hypertension     stable  . Diabetes mellitus (HCC)   . Tobacco use   . Restrictive lung disease   . Dermatitis     axillary  . Onychomycosis   . BPH (benign prostatic hyperplasia)     mild  . Dyslipidemia   . Tricuspid valve regurgitation     Past Surgical History  Procedure Laterality Date  . Cardiac catheterization  11/20/2003    normal coronaries  . US echocardiography  09/23/2009    mild-mod LVH,EF =>55%,mild MVP,trace MR,mild to mod TR, AOV mildly sclerotic.  Marland Kitchen Nm myocar perf wall motion  03/27/2012    normal    Outpatient Prescriptions Prior to Visit  Medication Sig Dispense Refill  . amLODipine (NORVASC) 2.5 MG tablet Take 2.5 mg by mouth daily.      Marland Kitchen aspirin 81 MG EC tablet Take 81 mg by mouth daily.      Marland Kitchen atorvastatin (LIPITOR) 10 MG tablet Take 1 tablet (10 mg total) by mouth daily. 30 tablet 11  . dutasteride (AVODART) 0.5 MG capsule Take 0.5 mg by mouth daily.      . fish  oil-omega-3 fatty acids 1000 MG capsule Take 2 g by mouth daily.    Marland Kitchen FLUZONE HIGH-DOSE 0.5 ML SUSY   0  . glimepiride (AMARYL) 1 MG tablet Take 1 mg by mouth daily before breakfast.      . terbinafine (LAMISIL) 250 MG tablet Take 250 mg by mouth daily.    . fluocinonide (LIDEX) 0.05 % cream Apply 1 application topically 2 (two) times daily. Reported on 10/15/2015     No facility-administered medications prior to visit.     Allergies:   Review of patient's allergies indicates no known allergies.   Social History   Social History  . Marital Status: Single    Spouse Name: N/A  . Number of Children: N/A  . Years of Education: N/A   Social History Main Topics  . Smoking status: Current Every Day Smoker -- 20 years    Types: Cigars  . Smokeless tobacco: Never Used     Comment: 3 cigars daily  . Alcohol Use: No  . Drug Use: No  . Sexual Activity: Not Currently   Other Topics Concern  . None   Social History Narrative     Family History:  The patient's family history includes Cancer in his father;  Heart failure in his mother.   ROS:   Please see the history of present illness.    ROS All other systems reviewed and are negative.   PHYSICAL EXAM:   VS:  BP 112/68 mmHg  Pulse 66  Ht 6' (1.829 m)  Wt 77.338 kg (170 lb 8 oz)  BMI 23.12 kg/m2   GEN: Well nourished, well developed, in no acute distress HEENT: normal Neck: no JVD, carotid bruits, or masses Cardiac: RRR; no murmurs, rubs, or gallops,no edema  Respiratory:  clear to auscultation bilaterally, normal work of breathing GI: soft, nontender, nondistended, + BS MS: no deformity or atrophy Skin: warm and dry, no rash Neuro:  Alert and Oriented x 3, Strength and sensation are intact Psych: euthymic mood, full affect  Wt Readings from Last 3 Encounters:  10/15/15 77.338 kg (170 lb 8 oz)  08/14/14 76.658 kg (169 lb)  10/24/13 74.844 kg (165 lb)      Studies/Labs Reviewed:   EKG:  EKG is ordered today.  The ekg  ordered today demonstrates this rhythm with a single intercalated PVC, nondiagnostic Q waves in the inferior leads, nonspecific ST segment elevation in lead 1 and aVL with an upwardly concave ST segments suggestive of early repolarization.  Recent Labs: No results found for requested labs within last 365 days.   Lipid Panel    Component Value Date/Time   CHOL 188 08/17/2014 1634   TRIG 104 08/17/2014 1634   HDL 53 08/17/2014 1634   CHOLHDL 3.5 08/17/2014 1634   VLDL 21 08/17/2014 1634   LDLCALC 114* 08/17/2014 1634    ASSESSMENT:    1. Hyperlipidemia   2. Essential hypertension   3. Diabetes mellitus type 2 in nonobese (HCC)   4. Tobacco abuse   5. Medication management   6. Abnormal ECG      PLAN:  In order of problems listed above:  1. About a year ago his lipid profile was generally favorable with the exception of borderline elevated LDL cholesterol. Since then he has been taking atorvastatin. He is due to have repeat labs performed next month. Target LDL less than 100 in the setting of diabetes mellitus 2. Blood pressure control is excellent on low-dose single agent 3. He reports excellent glycemic control, again labs scheduled next month 4. Although cigar smoking is unlikely to increase his risk of vascular complications, his still associated with other health problems such as head and neck cancer and I encouraged him to stop 5. Check metabolic panel with labs next month 6. He has no symptoms whatsoever of angina pectoris. He has nonspecific ST segment elevation in the lateral leads of uncertain etiology but also of unlikely clinical significance. No plan for further investigation at this time    Medication Adjustments/Labs and Tests Ordered: Current medicines are reviewed at length with the patient today.  Concerns regarding medicines are outlined above.  Medication changes, Labs and Tests ordered today are listed in the Patient Instructions below. Patient Instructions    Your physician recommends that you return for lab work in: FASTING AT [facility]  Dr. Royann Mccarthy recommends that you schedule a follow-up appointment in: ONE YEAR         SignedThurmon Fair, MD  10/15/2015 5:48 PM    [facility] [address] [place], [place], [place]  [address] Phone: [phone]; Fax: [phone]

## 2015-10-15 NOTE — Patient Instructions (Signed)
Your physician recommends that you return for lab work in: FASTING AT [facility]  Dr. Royann Shivers recommends that you schedule a follow-up appointment in: ONE YEAR

## 2015-10-21 LAB — COMPREHENSIVE METABOLIC PANEL
ALK PHOS: 75 U/L (ref 40–115)
ALT: 10 U/L (ref 9–46)
AST: 15 U/L (ref 10–35)
Albumin: 4.1 g/dL (ref 3.6–5.1)
BUN: 13 mg/dL (ref 7–25)
CO2: 29 mmol/L (ref 20–31)
CREATININE: 1.13 mg/dL (ref 0.70–1.18)
Calcium: 9.4 mg/dL (ref 8.6–10.3)
Chloride: 105 mmol/L (ref 98–110)
Glucose, Bld: 77 mg/dL (ref 65–99)
Potassium: 4 mmol/L (ref 3.5–5.3)
SODIUM: 143 mmol/L (ref 135–146)
TOTAL PROTEIN: 6.7 g/dL (ref 6.1–8.1)
Total Bilirubin: 0.6 mg/dL (ref 0.2–1.2)

## 2015-10-21 LAB — LIPID PANEL
Cholesterol: 144 mg/dL (ref 125–200)
HDL: 55 mg/dL (ref >=40)
LDL CALC: 74 mg/dL (ref <130)
Total CHOL/HDL Ratio: 2.6 Ratio (ref <=5.0)
Triglycerides: 75 mg/dL (ref <150)
VLDL: 15 mg/dL (ref <30)

## 2015-10-21 LAB — HEMOGLOBIN A1C
HEMOGLOBIN A1C: 5.8 % — AB (ref <5.7)
Mean Plasma Glucose: 120 mg/dL — ABNORMAL HIGH (ref <117)

## 2015-11-03 ENCOUNTER — Other Ambulatory Visit: Payer: Self-pay | Admitting: Cardiovascular Disease

## 2015-11-03 NOTE — Telephone Encounter (Signed)
REFILL 

## 2016-07-29 ENCOUNTER — Telehealth: Payer: Self-pay | Admitting: *Deleted

## 2016-07-29 ENCOUNTER — Ambulatory Visit (INDEPENDENT_AMBULATORY_CARE_PROVIDER_SITE_OTHER): Payer: Medicare HMO

## 2016-07-29 ENCOUNTER — Ambulatory Visit: Payer: Medicare HMO

## 2016-07-29 ENCOUNTER — Ambulatory Visit (HOSPITAL_BASED_OUTPATIENT_CLINIC_OR_DEPARTMENT_OTHER)
Admission: RE | Admit: 2016-07-29 | Discharge: 2016-07-29 | Disposition: A | Discharge status: Home / Self Care | Payer: Medicare HMO | Source: Ambulatory Visit | Attending: Physician Assistant | Admitting: Physician Assistant

## 2016-07-29 ENCOUNTER — Ambulatory Visit (INDEPENDENT_AMBULATORY_CARE_PROVIDER_SITE_OTHER): Payer: Medicare HMO | Admitting: Physician Assistant

## 2016-07-29 VITALS — BP 138/72 | HR 74 | Temp 97.8°F | Resp 16 | Ht 68.0 in | Wt 166.0 lb

## 2016-07-29 DIAGNOSIS — M542 Cervicalgia: Secondary | ICD-10-CM

## 2016-07-29 DIAGNOSIS — M546 Pain in thoracic spine: Secondary | ICD-10-CM

## 2016-07-29 DIAGNOSIS — N289 Disorder of kidney and ureter, unspecified: Secondary | ICD-10-CM | POA: Diagnosis not present

## 2016-07-29 DIAGNOSIS — S0181XA Laceration without foreign body of other part of head, initial encounter: Secondary | ICD-10-CM | POA: Diagnosis not present

## 2016-07-29 DIAGNOSIS — M25511 Pain in right shoulder: Secondary | ICD-10-CM

## 2016-07-29 DIAGNOSIS — J349 Unspecified disorder of nose and nasal sinuses: Secondary | ICD-10-CM | POA: Insufficient documentation

## 2016-07-29 DIAGNOSIS — W19XXXA Unspecified fall, initial encounter: Secondary | ICD-10-CM | POA: Diagnosis not present

## 2016-07-29 DIAGNOSIS — W1789XA Other fall from one level to another, initial encounter: Secondary | ICD-10-CM

## 2016-07-29 DIAGNOSIS — Z23 Encounter for immunization: Secondary | ICD-10-CM

## 2016-07-29 MED ORDER — MELOXICAM 15 MG PO TABS: 15.0000 mg | ORAL_TABLET | Freq: Every day | ORAL | 0 refills | Status: DC

## 2016-07-29 NOTE — Patient Instructions (Addendum)
You can go now for your CT scan now at [facility].  [address], [place], [place]  [phone]  WOUND CARE Please return in 7 days to have your stitches/staples removed or sooner if you have concerns. Marland Kitchen. Keep area clean and dry for 24 hours. Do not remove bandage, if applied. . After 24 hours, remove bandage and wash wound gently with mild soap and warm water. Reapply a new bandage after cleaning wound, if directed. . Continue daily cleansing with soap and water until stitches/staples are removed. . Do not apply any ointments or creams to the wound while stitches/staples are in place, as this may cause delayed healing. . Notify the office if you experience any of the following signs of infection: Swelling, redness, pus drainage, streaking, fever >101.0 F . Notify the office if you experience excessive bleeding that does not stop after 15-20 minutes of constant, firm pressure.   For neck pain and shoulder pain, use the NSAID but take in the morning to reduce your risk of falling.   Apply ice to the neck and shoulder for pain relief.      IF you received an x-ray today, you will receive an invoice from [facility]. Please contact [facility] at [phone] with questions or concerns regarding your invoice.   IF you received labwork today, you will receive an invoice from [facility]. Please contact [facility] at [phone] with questions or concerns regarding your invoice.   Our billing staff will not be able to assist you with questions regarding bills from these companies.  You will be contacted with the lab results as soon as they are available. The fastest way to get your results is to activate your My Chart account. Instructions are located on the last page of this paperwork. If you have not heard from us regarding the results in 2 weeks, please contact this office.

## 2016-07-29 NOTE — Progress Notes (Signed)
Chase Mccarthy  MRN: [id number] DOB: 1941/03/15  Subjective:  Chase Mccarthy is a 75 y.o. male seen in office today for a chief complaint of fall two nights ago.   Notes he fell out of the bed two nights ago around 2 am while when he was dreaming. He hit his head on the dresser. Had associated headache the next day which  was releived with ibuprofen,  asking the same thing over and over again, and immediate blurred vision in right eye that resolved after five minutes.  Denies LOC, vomitng, and nausea.   He has associated bleeding from the skin overlying his ear, pain with movement of neck and and right shoulder blade. Had associated tingling in left hand that resolved after 10 minutes and decrease ROM of neck and back.   Of note, he had cataract surgery on 06/08/16  Review of Systems  Constitutional: Negative for chills, diaphoresis and fever.  Respiratory: Negative for shortness of breath.   Cardiovascular: Negative for chest pain.  Musculoskeletal: Negative for gait problem.  Neurological: Negative for dizziness, facial asymmetry and weakness.    Patient Active Problem List   Diagnosis Date Noted  . Hypertension   . Diabetes mellitus (HCC)   . Restrictive lung disease   . Onychomycosis   . Essential hypertension 01/13/2011    Class: Chronic  . Diabetes mellitus type 2 in nonobese (HCC) 01/13/2011  . Vitamin D deficiency 08/17/2009    Class: History of  . Benign prostatic hypertrophy with urinary obstruction 01/13/2004    Class: Chronic  . Hyperlipidemia with target LDL less than 100 10/28/1999    Class: History of  . Tobacco abuse 10/15/1992    Class: Chronic    Current Outpatient Prescriptions on File Prior to Visit  Medication Sig Dispense Refill  . amLODipine (NORVASC) 2.5 MG tablet Take 2.5 mg by mouth daily.      Marland Kitchen. aspirin 81 MG EC tablet Take 81 mg by mouth daily.      Marland Kitchen. dutasteride (AVODART) 0.5 MG capsule Take 0.5 mg by mouth daily.      . fish oil-omega-3 fatty  acids 1000 MG capsule Take 2 g by mouth daily.    Marland Kitchen. glimepiride (AMARYL) 1 MG tablet Take 1 mg by mouth daily before breakfast.       No current facility-administered medications on file prior to visit.     No Known Allergies   Social History   Social History  . Marital status: Single    Spouse name: N/A  . Number of children: N/A  . Years of education: N/A   Occupational History  . Not on file.   Social History Main Topics  . Smoking status: Current Every Day Smoker    Years: 20.00    Types: Cigars  . Smokeless tobacco: Never Used     Comment: 3 cigars daily  . Alcohol use No  . Drug use: No  . Sexual activity: Not Currently   Other Topics Concern  . Not on file   Social History Narrative  . No narrative on file      Objective:  BP 138/72   Pulse 74   Temp 97.8 F (36.6 C) (Oral)   Resp 16   Ht 5\' 8"  (1.727 m)   Wt 166 lb (75.3 kg)   SpO2 98%   BMI 25.24 kg/m   Physical Exam  Constitutional: He is oriented to person, place, and time and well-developed, well-nourished, and in no distress.  HENT:  Head: Normocephalic and atraumatic.  Right Ear: Tympanic membrane, external ear and ear canal normal.  Left Ear: Tympanic membrane, external ear and ear canal normal.  Dried blood noted on superior aspect of proximal portion of ear bordering scalp. Wound care reveals a 3 cm gaping laceration.    Eyes: Conjunctivae and EOM are normal. Pupils are equal, round, and reactive to light.  Fundoscopic exam:      The right eye shows no hemorrhage. The right eye shows red reflex.       The left eye shows no hemorrhage. The left eye shows red reflex.  Left superior eyelid droop, which is normal for patient.   Neck: Trachea normal. Neck rigidity present. Decreased range of motion present.  Pulmonary/Chest: Effort normal.  Musculoskeletal:       Right shoulder: He exhibits decreased range of motion and bony tenderness (along palpation of scapula spine).       Cervical  back: He exhibits bony tenderness.       Thoracic back: He exhibits bony tenderness.       Lumbar back: Normal.  Neurological: He is alert and oriented to person, place, and time. He has intact cranial nerves. Gait normal.  Skin: Skin is warm and dry.  Psychiatric: Affect normal.  Vitals reviewed.  PROCEDURE NOTE: laceration repair Verbal consent obtained from patient.  Local anesthesia with 3cc Lidocaine 2% without epinephrine.  Wound explored for tendon, ligament damage. Wound scrubbed with soap and water and rinsed. Wound closed with #9 5-0 Ethilon simple interrupted sutures.  Wound cleansed and dressed.   Dg Cervical Spine Complete  Result Date: 07/29/2016 CLINICAL DATA:  Acute neck pain and stiffness following fall out of bed 3 days ago. Initial encounter. EXAM: CERVICAL SPINE - COMPLETE 4+ VIEW COMPARISON:  09/26/2011 MR FINDINGS: There is no evidence of acute fracture, subluxation or prevertebral soft tissue swelling. Multilevel degenerative disc disease and facet arthropathy identified with bony foraminal narrowing at multiple levels identified. IMPRESSION: No evidence of acute abnormality. Electronically Signed   By: Harmon Pier M.D.   On: 07/29/2016 12:52   Dg Thoracic Spine 2 View  Result Date: 07/29/2016 CLINICAL DATA:  Thoracic spine pain following fall 3 days ago. Initial encounter. EXAM: THORACIC SPINE 2 VIEWS COMPARISON:  None. FINDINGS: There is no evidence of acute fracture or subluxation. No focal bony lesions are present. A possible pulmonary nodule within the mid -upper left lung noted. IMPRESSION: No evidence of acute bony abnormality. Possible left pulmonary nodule-recommend chest CT for further evaluation. Electronically Signed   By: Harmon Pier M.D.   On: 07/29/2016 13:10   Dg Shoulder Right  Result Date: 07/29/2016 CLINICAL DATA:  Acute right shoulder pain following fall. Initial encounter. EXAM: RIGHT SHOULDER - 2+ VIEW COMPARISON:  11/13/2003 chest radiograph  FINDINGS: There is no evidence of acute fracture, subluxation or dislocation. Mild degenerative changes the [facility] joint noted. No focal bony lesions are present. IMPRESSION: No acute abnormality. Electronically Signed   By: Harmon Pier M.D.   On: 07/29/2016 13:08    Assessment and Plan :  This case was precepted with Dr. Gwendolyn Grant.   1. Neck pain -Instructed to use ice daily for pain, start neck stretches once pain resovles - DG Cervical Spine Complete; Future - CT Chest Wo Contrast; Future - meloxicam (MOBIC) 15 MG tablet; Take 1 tablet (15 mg total) by mouth daily.  Dispense: 30 tablet; Refill: 0  2. Thoracic spine pain - DG Thoracic Spine 2 View;  Future  3. Right shoulder pain, unspecified chronicity - DG Shoulder Right; Future  4. Fall from height of less than 3 feet - CT Head Wo Contrast; Future  5. Laceration of other part of head without foreign body, initial encounter - Wound care instructions given, pt instructed to follow up in 7 days for suture removal. - Pt instructed to notify the office if he experiences any of the following signs of infection: Swelling, redness, pus drainage, streaking, fever >101.0 F - Tdap vaccine greater than or equal to 7yo IM   Benjiman CoreBrittany Iliany Losier PA-C  [facility] 07/29/2016 1:20 PM

## 2016-07-29 NOTE — Telephone Encounter (Signed)
Please send CT results of head and chest over to patients PCP Dr. Willey BladeEric Dean  [address]  Phone [phone].

## 2016-08-05 ENCOUNTER — Ambulatory Visit (INDEPENDENT_AMBULATORY_CARE_PROVIDER_SITE_OTHER): Payer: Medicare HMO | Admitting: Family Medicine

## 2016-08-05 VITALS — BP 110/74 | HR 64 | Temp 97.6°F | Resp 18 | Ht 68.0 in | Wt 162.0 lb

## 2016-08-05 DIAGNOSIS — S161XXD Strain of muscle, fascia and tendon at neck level, subsequent encounter: Secondary | ICD-10-CM

## 2016-08-05 DIAGNOSIS — S01302D Unspecified open wound of left ear, subsequent encounter: Secondary | ICD-10-CM | POA: Diagnosis not present

## 2016-08-05 MED ORDER — TRAMADOL HCL 50 MG PO TABS: 50.0000 mg | ORAL_TABLET | Freq: Three times a day (TID) | ORAL | 0 refills | Status: DC | PRN

## 2016-08-05 NOTE — Progress Notes (Signed)
Patient ID: Chase Mccarthy, male    DOB: 09/03/1941  Age: 75 y.o. MRN: [id number]  Chief Complaint  Patient presents with  . Neck Pain    From falling off the bed onto head  . Suture / Staple Removal    right ear    Subjective:   Patient is been about 8 or 9 days since he fell out of bed. He injured his neck. X-rays were reviewed again, and don't show any obvious fracture. 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 He continues to hurt at the lower occipital region and upper neck. The sutures if healed up well.  Current allergies, medications, problem list, past/family and social histories reviewed.  Objective:  BP 110/74 (BP Location: Right Arm, Patient Position: Sitting, Cuff Size: Small)   Pulse 64   Temp 97.6 F (36.4 C) (Oral)   Resp 18   Ht 5\' 8"  (1.727 m)   Wt 162 lb (73.5 kg)   SpO2 98%   BMI 24.63 kg/m   Wound on right ear is healed well and all the sutures were removed. He had almost toward the upper part of his ear off, but it is healing nicely. He is tender in the top of his neck supple region. Range of motion adequate  Assessment & Plan:   Assessment: 1. Acute strain of neck muscle, subsequent encounter   2. Wound, open, auricle, ear, left, subsequent encounter       Plan: This is just going to take time to resolve. Well give him a few additional pain pills to use a when necessary basis. See instructions  No orders of the defined types were placed in this encounter.   No orders of the defined types were placed in this encounter.        Patient Instructions   Try to gently wash the top of your ear, but be gentle with it over the next week until the final healing occurs  The neck and head pain should gradually resolve over the next couple of weeks. Keep taking the meloxicam 1 at breakfast and one at supper. Discontinue it if it upsets her stomach.  For more severe pain you can take 2 extra strength Tylenol, but do if that does  not do the job take tramadol 50 mg 1 every 8 hours only when needed.  Return as necessary or if the pains don't gradually resolve over the next few weeks. I don't think there is really a lot else that can be offered for it except time.    IF you received an x-ray today, you will receive an invoice from [facility]. Please contact [facility] at [phone] with questions or concerns regarding your invoice.   IF you received labwork today, you will receive an invoice from [facility]. Please contact [facility] at [phone] with questions or concerns regarding your invoice.   Our billing staff will not be able to assist you with questions regarding bills from these companies.  You will be contacted with the lab results as soon as they are available. The fastest way to get your results is to activate your My Chart account. Instructions are located on the last page of this paperwork. If you have not heard from us regarding the results in 2 weeks, please contact this office.        No Follow-up on file.   [name], MD 08/05/2016

## 2016-08-05 NOTE — Patient Instructions (Addendum)
Try to gently wash the top of your ear, but be gentle with it over the next week until the final healing occurs  The neck and head pain should gradually resolve over the next couple of weeks. Keep taking the meloxicam 1 at breakfast and one at supper. Discontinue it if it upsets her stomach.  For more severe pain you can take 2 extra strength Tylenol, but do if that does not do the job take tramadol 50 mg 1 every 8 hours only when needed.  Return as necessary or if the pains don't gradually resolve over the next few weeks. I don't think there is really a lot else that can be offered for it except time.    IF you received an x-ray today, you will receive an invoice from [facility]. Please contact [facility] at [phone] with questions or concerns regarding your invoice.   IF you received labwork today, you will receive an invoice from [facility]. Please contact [facility] at [phone] with questions or concerns regarding your invoice.   Our billing staff will not be able to assist you with questions regarding bills from these companies.  You will be contacted with the lab results as soon as they are available. The fastest way to get your results is to activate your My Chart account. Instructions are located on the last page of this paperwork. If you have not heard from us regarding the results in 2 weeks, please contact this office.

## 2016-08-23 ENCOUNTER — Telehealth: Payer: Self-pay | Admitting: Family Medicine

## 2016-08-23 NOTE — Telephone Encounter (Signed)
Pam [facility] has sent two faxes for release of medical records of pt xray and CT reports please look into this

## 2016-08-24 NOTE — Telephone Encounter (Signed)
CD and report sent via mail to Chiropractor.  Chase Mccarthy

## 2016-09-18 ENCOUNTER — Ambulatory Visit: Payer: Medicare HMO | Admitting: Cardiovascular Disease

## 2017-03-29 ENCOUNTER — Other Ambulatory Visit: Payer: Self-pay | Admitting: Cardiovascular Disease

## 2017-08-15 ENCOUNTER — Telehealth: Payer: Self-pay | Admitting: Diagnostic Neuroimaging

## 2017-08-15 NOTE — Telephone Encounter (Addendum)
Attempted to reach patient on home phone number listed; number no longer in service. Called patient on number listed as mobile; person who answered the phone stated "This is not Chase Bakke's number."  Chase Mccarthy came in with complaint of different problem than the one Dr Bary RichardE Dean referred him for, re: neck pain, fall. If patient calls in, he must get another referral for back pain, as this is a new problem. There are no sooner available new pt appointments with any providers in this office. He should keep his 09/05/17 new pt appointment with Dr Marjory LiesPenumalli for evaluation of neck pain and fall.

## 2017-08-15 NOTE — Telephone Encounter (Signed)
I advised patient of previous message.and he understood and will keep appointment on 09-05-17. I did get correct telephone number for the patient.

## 2017-09-05 ENCOUNTER — Encounter: Payer: Self-pay | Admitting: Diagnostic Neuroimaging

## 2017-09-05 ENCOUNTER — Ambulatory Visit: Payer: Medicare HMO | Admitting: Diagnostic Neuroimaging

## 2017-09-05 VITALS — BP 129/72 | HR 58 | Ht 68.0 in | Wt 165.4 lb

## 2017-09-05 DIAGNOSIS — M542 Cervicalgia: Secondary | ICD-10-CM

## 2017-09-05 DIAGNOSIS — R269 Unspecified abnormalities of gait and mobility: Secondary | ICD-10-CM | POA: Diagnosis not present

## 2017-09-05 NOTE — Progress Notes (Signed)
[facility]  PATIENT: Chase Mccarthy DOB: 20-Apr-1941  REFERRING CLINICIAN: Bary RichardE Dean, MD HISTORY FROM: patient  REASON FOR VISIT: new consult    HISTORICAL  CHIEF COMPLAINT:  Chief Complaint  Patient presents with  . New Patient (Initial Visit)    ref. by Dr. Willey BladeEric Dean, paper referral  . Neck Pain    h/o fall    HISTORY OF PRESENT ILLNESS:   76 year old male here for evaluation of neck pain and left occipital pain.  For past 3-4 months patient has had onset of soreness and aching sensation in his left neck radiating to the left occipital region.  This happens every other day and is mild.  Patient has been going to chiropractor for treatments with mild relief.  Uses meloxicam occasionally which helps with pain symptoms.  Patient referred here to see if there may be any other cause of symptoms.   REVIEW OF SYSTEMS: Full 14 system review of systems performed and negative with exception of: only as per HPI.    ALLERGIES: No Known Allergies  HOME MEDICATIONS: Outpatient Medications Prior to Visit  Medication Sig Dispense Refill  . amLODipine (NORVASC) 2.5 MG tablet Take 2.5 mg by mouth daily.      Marland Kitchen. aspirin 81 MG EC tablet Take 81 mg by mouth daily.      Marland Kitchen. dutasteride (AVODART) 0.5 MG capsule Take 0.5 mg by mouth daily.      . fish oil-omega-3 fatty acids 1000 MG capsule Take 2 g by mouth daily.    Marland Kitchen. glimepiride (AMARYL) 1 MG tablet Take 1 mg by mouth daily before breakfast.      . lisinopril (PRINIVIL,ZESTRIL) 10 MG tablet     . meloxicam (MOBIC) 15 MG tablet Take 1 tablet (15 mg total) by mouth daily. 30 tablet 0  . memantine (NAMENDA) 10 MG tablet Take 10 mg by mouth daily.    . prednisoLONE acetate (PRED FORTE) 1 % ophthalmic suspension     . ketorolac (ACULAR) 0.4 % SOLN     . traMADol (ULTRAM) 50 MG tablet Take 1 tablet (50 mg total) by mouth every 8 (eight) hours as needed. 15 tablet 0   No facility-administered medications prior to visit.      PAST MEDICAL HISTORY: Past Medical History:  Diagnosis Date  . BPH (benign prostatic hyperplasia)    mild  . Dermatitis    axillary  . Diabetes mellitus (HCC)   . Dyslipidemia   . Hypertension    stable  . Onychomycosis   . Restrictive lung disease   . Tobacco use   . Tricuspid valve regurgitation     PAST SURGICAL HISTORY: Past Surgical History:  Procedure Laterality Date  . CARDIAC CATHETERIZATION  11/20/2003   normal coronaries  . NM MYOCAR PERF WALL MOTION  03/27/2012   normal  . US ECHOCARDIOGRAPHY  09/23/2009   mild-mod LVH,EF =>55%,mild MVP,trace MR,mild to mod TR, AOV mildly sclerotic.    FAMILY HISTORY: Family History  Problem Relation Age of Onset  . Heart failure Mother   . Cancer Father     SOCIAL HISTORY:  Social History   Socioeconomic History  . Marital status: Single    Spouse name: Not on file  . Number of children: Not on file  . Years of education: Not on file  . Highest education level: Not on file  Social Needs  . Financial resource strain: Not on file  . Food insecurity - worry: Not on file  .  Food insecurity - inability: Not on file  . Transportation needs - medical: Not on file  . Transportation needs - non-medical: Not on file  Occupational History  . Not on file  Tobacco Use  . Smoking status: Current Every Day Smoker    Years: 20.00    Types: Cigars  . Smokeless tobacco: Never Used  . Tobacco comment: 3 cigars daily  Substance and Sexual Activity  . Alcohol use: No  . Drug use: No  . Sexual activity: Not Currently  Other Topics Concern  . Not on file  Social History Narrative  . Not on file     PHYSICAL EXAM  GENERAL EXAM/CONSTITUTIONAL: Vitals:  Vitals:   09/05/17 0844  BP: 129/72  Pulse: (!) 58  Weight: 165 lb 6.4 oz (75 kg)  Height: 5\' 8"  (1.727 m)     Body mass index is 25.15 kg/m.  No exam data present  Patient is in no distress; well developed, nourished and groomed; neck is  supple  CARDIOVASCULAR:  Examination of carotid arteries is normal; no carotid bruits  Regular rate and rhythm, no murmurs  Examination of peripheral vascular system by observation and palpation is normal  EYES:  Ophthalmoscopic exam of optic discs and posterior segments is normal; no papilledema or hemorrhages  MUSCULOSKELETAL:  Gait, strength, tone, movements noted in Neurologic exam below  NEUROLOGIC: MENTAL STATUS:  No flowsheet data found.  awake, alert, oriented to person, place and time  recent and remote memory intact  normal attention and concentration  language fluent, comprehension intact, naming intact,   fund of knowledge appropriate  CRANIAL NERVE:   2nd - no papilledema on fundoscopic exam  2nd, 3rd, 4th, 6th - pupils equal and reactive to light, visual fields full to confrontation, extraocular muscles intact, no nystagmus  5th - facial sensation symmetric  7th - facial strength symmetric  8th - hearing intact  9th - palate elevates symmetrically, uvula midline  11th - shoulder shrug symmetric  12th - tongue protrusion midline  MOTOR:   normal bulk and tone, full strength in the BUE, BLE  PARATONIA IN BUE  MILD BRADYKINESIA IN RIGHT HAND  SENSORY:   normal and symmetric to light touch, temperature, vibration  COORDINATION:   finger-nose-finger, fine finger movements normal  REFLEXES:   deep tendon reflexes present and symmetric  GAIT/STATION:   STOOPED POSTURE; narrow based gait; DECR ARM SWING; SHORT STEPS; SLIGHT SHUFFLING GAIT    DIAGNOSTIC DATA (LABS, IMAGING, TESTING) - I reviewed patient records, labs, notes, testing and imaging myself where available.  Lab Results  Component Value Date   WBC 4.8 10/19/2013   HGB 13.2 (A) 10/19/2013   HCT 42.8 (A) 10/19/2013   MCV 95.0 10/19/2013      Component Value Date/Time   NA 143 10/20/2015 1339   K 4.0 10/20/2015 1339   CL 105 10/20/2015 1339   CO2 29 10/20/2015  1339   GLUCOSE 77 10/20/2015 1339   BUN 13 10/20/2015 1339   CREATININE 1.13 10/20/2015 1339   CALCIUM 9.4 10/20/2015 1339   PROT 6.7 10/20/2015 1339   ALBUMIN 4.1 10/20/2015 1339   AST 15 10/20/2015 1339   ALT 10 10/20/2015 1339   ALKPHOS 75 10/20/2015 1339   BILITOT 0.6 10/20/2015 1339   Lab Results  Component Value Date   CHOL 144 10/20/2015   HDL 55 10/20/2015   LDLCALC 74 10/20/2015   TRIG 75 10/20/2015   CHOLHDL 2.6 10/20/2015   Lab Results  Component  Value Date   HGBA1C 5.8 (H) 10/20/2015   No results found for: VITAMINB12 No results found for: TSH   07/29/16 CT head - Mild ethmoid sinus disease bilaterally. Foci of arterial vascular calcification in the carotid siphon regions. No intracranial mass, hemorrhage, or extra-axial fluid collection. Gray-white compartments appear normal.  07/29/16 cervical spine xray - No evidence of acute abnormality.     ASSESSMENT AND PLAN  76 y.o. year old male here with left neck pain and left occipital neuralgia pain x 3-4 months (mild, intermittent).  Symptoms well controlled with meloxicam as needed.   Dx:  1. Neck pain   2. Gait difficulty     PLAN:  NECK PAIN (musculoskeletal) - refer to PT evaluation for gait training and neck pain - continue mobic, ibuprofen or tylenol as needed for pain - continue heating pad and stretching exercises  Orders Placed This Encounter  Procedures  . Ambulatory referral to Physical Therapy   Return if symptoms worsen or fail to improve, for return to PCP.; may return as needed    Suanne Marker, MD 09/05/2017, 8:53 AM Certified in Neurology, Neurophysiology and Neuroimaging  [facility] [address] [place], [place] [address] [phone]

## 2017-09-05 NOTE — Patient Instructions (Signed)
NECK PAIN (musculoskeletal) - physical therapy evaluation for gait training and neck pain - continue mobic, ibuprofen or tylenol as needed for pain - continue heating pad and stretching exercises

## 2017-09-21 ENCOUNTER — Ambulatory Visit: Payer: Medicare PPO | Admitting: Rehabilitative and Restorative Service Providers"

## 2017-10-02 ENCOUNTER — Other Ambulatory Visit: Payer: Self-pay | Admitting: Internal Medicine

## 2017-10-02 DIAGNOSIS — N2889 Other specified disorders of kidney and ureter: Secondary | ICD-10-CM

## 2017-10-17 ENCOUNTER — Ambulatory Visit
Admission: RE | Admit: 2017-10-17 | Discharge: 2017-10-17 | Disposition: A | Discharge status: Home / Self Care | Payer: Commercial Managed Care - HMO | Source: Ambulatory Visit | Attending: Internal Medicine | Admitting: Internal Medicine

## 2017-10-17 DIAGNOSIS — N2889 Other specified disorders of kidney and ureter: Secondary | ICD-10-CM

## 2017-10-17 MED ORDER — GADOBENATE DIMEGLUMINE 529 MG/ML IV SOLN
15.0000 mL | Freq: Once | INTRAVENOUS | Status: AC | PRN
  Administered 2017-10-17: 15 mL via INTRAVENOUS

## 2019-06-09 ENCOUNTER — Telehealth (HOSPITAL_COMMUNITY): Payer: Self-pay | Admitting: *Deleted

## 2019-06-09 NOTE — Telephone Encounter (Signed)
Spoke with pt to schedule appt. He was offered an earlier appt, but asked to wait due to copay. EM

## 2019-07-04 ENCOUNTER — Encounter (HOSPITAL_COMMUNITY): Payer: Commercial Managed Care - HMO

## 2019-07-31 ENCOUNTER — Telehealth (HOSPITAL_COMMUNITY): Payer: Self-pay | Admitting: *Deleted

## 2019-07-31 NOTE — Telephone Encounter (Signed)
Spoke with someone (friend) trying to reach Chase Mccarthy to arrange an appot. I was advised to call back tomorrow, 08/01/19 at noon.

## 2019-10-24 ENCOUNTER — Other Ambulatory Visit: Payer: Self-pay | Admitting: Internal Medicine

## 2019-10-24 DIAGNOSIS — R1012 Left upper quadrant pain: Secondary | ICD-10-CM

## 2019-10-26 ENCOUNTER — Ambulatory Visit: Payer: Commercial Managed Care - HMO

## 2019-11-01 ENCOUNTER — Ambulatory Visit: Payer: Commercial Managed Care - HMO | Attending: Internal Medicine

## 2019-11-01 DIAGNOSIS — Z23 Encounter for immunization: Secondary | ICD-10-CM | POA: Insufficient documentation

## 2019-11-25 ENCOUNTER — Ambulatory Visit: Payer: Medicare HMO | Attending: Internal Medicine

## 2019-11-25 DIAGNOSIS — Z23 Encounter for immunization: Secondary | ICD-10-CM

## 2019-11-25 NOTE — Progress Notes (Signed)
   Covid-19 Vaccination Clinic  Name:  Chase Mccarthy    MRN: [id number] DOB: 10-03-1940  11/25/2019  Chase Mccarthy was observed post Covid-19 immunization for 15 minutes without incident. He was provided with Vaccine Information Sheet and instruction to access the V-Safe system.   Chase Mccarthy was instructed to call 911 with any severe reactions post vaccine: Marland Kitchen Difficulty breathing  . Swelling of face and throat  . A fast heartbeat  . A bad rash all over body  . Dizziness and weakness   Immunizations Administered    Name Date Dose VIS Date Route   Pfizer COVID-19 Vaccine 11/25/2019 12:04 PM 0.3 mL 08/22/2019 Intramuscular   Manufacturer: [employer], [employer]   Lot: [id number]   NDC: [address]-9

## 2021-05-02 ENCOUNTER — Other Ambulatory Visit: Payer: Self-pay | Admitting: Internal Medicine

## 2021-05-02 ENCOUNTER — Ambulatory Visit
Admission: RE | Admit: 2021-05-02 | Discharge: 2021-05-02 | Disposition: A | Discharge status: Home / Self Care | Payer: Medicare HMO | Source: Ambulatory Visit | Attending: Internal Medicine | Admitting: Internal Medicine

## 2021-05-02 DIAGNOSIS — R634 Abnormal weight loss: Secondary | ICD-10-CM

## 2021-05-02 DIAGNOSIS — Z87891 Personal history of nicotine dependence: Secondary | ICD-10-CM

## 2022-10-17 ENCOUNTER — Encounter: Payer: Self-pay | Admitting: Diagnostic Neuroimaging

## 2022-10-17 ENCOUNTER — Ambulatory Visit: Payer: Medicare PPO | Admitting: Diagnostic Neuroimaging

## 2022-11-14 ENCOUNTER — Ambulatory Visit: Payer: Medicare PPO | Admitting: Diagnostic Neuroimaging

## 2022-11-14 ENCOUNTER — Telehealth: Payer: Self-pay | Admitting: Diagnostic Neuroimaging

## 2022-11-14 NOTE — Telephone Encounter (Signed)
Pt's daughter, Loanne Drilling cancelled pt's appt due to pt not feeling well. Informed daughter pt maybe dismissed as a new patient due to no shows.

## 2023-01-03 ENCOUNTER — Ambulatory Visit: Payer: Medicare PPO | Admitting: Diagnostic Neuroimaging

## 2023-01-18 ENCOUNTER — Other Ambulatory Visit: Payer: Self-pay

## 2023-01-18 ENCOUNTER — Inpatient Hospital Stay (HOSPITAL_COMMUNITY)
Admission: EM | Admit: 2023-01-18 | Discharge: 2023-01-24 | DRG: 682 | Disposition: A | Discharge status: Skilled Nursing Facility | Payer: Medicare HMO | Source: Ambulatory Visit | Attending: Internal Medicine | Admitting: Internal Medicine

## 2023-01-18 ENCOUNTER — Encounter (HOSPITAL_COMMUNITY): Payer: Self-pay | Admitting: Emergency Medicine

## 2023-01-18 ENCOUNTER — Emergency Department (HOSPITAL_COMMUNITY): Payer: Medicare HMO

## 2023-01-18 DIAGNOSIS — N1831 Chronic kidney disease, stage 3a: Secondary | ICD-10-CM | POA: Diagnosis present

## 2023-01-18 DIAGNOSIS — I130 Hypertensive heart and chronic kidney disease with heart failure and stage 1 through stage 4 chronic kidney disease, or unspecified chronic kidney disease: Secondary | ICD-10-CM | POA: Diagnosis present

## 2023-01-18 DIAGNOSIS — D649 Anemia, unspecified: Secondary | ICD-10-CM | POA: Diagnosis present

## 2023-01-18 DIAGNOSIS — F03B0 Unspecified dementia, moderate, without behavioral disturbance, psychotic disturbance, mood disturbance, and anxiety: Secondary | ICD-10-CM | POA: Diagnosis not present

## 2023-01-18 DIAGNOSIS — E43 Unspecified severe protein-calorie malnutrition: Secondary | ICD-10-CM | POA: Diagnosis present

## 2023-01-18 DIAGNOSIS — F05 Delirium due to known physiological condition: Secondary | ICD-10-CM | POA: Diagnosis not present

## 2023-01-18 DIAGNOSIS — F03B18 Unspecified dementia, moderate, with other behavioral disturbance: Secondary | ICD-10-CM | POA: Diagnosis present

## 2023-01-18 DIAGNOSIS — F03B11 Unspecified dementia, moderate, with agitation: Secondary | ICD-10-CM | POA: Diagnosis present

## 2023-01-18 DIAGNOSIS — E860 Dehydration: Secondary | ICD-10-CM | POA: Diagnosis present

## 2023-01-18 DIAGNOSIS — Z681 Body mass index (BMI) 19 or less, adult: Secondary | ICD-10-CM | POA: Diagnosis not present

## 2023-01-18 DIAGNOSIS — R5381 Other malaise: Secondary | ICD-10-CM | POA: Diagnosis present

## 2023-01-18 DIAGNOSIS — E785 Hyperlipidemia, unspecified: Secondary | ICD-10-CM | POA: Diagnosis present

## 2023-01-18 DIAGNOSIS — E1122 Type 2 diabetes mellitus with diabetic chronic kidney disease: Secondary | ICD-10-CM | POA: Diagnosis present

## 2023-01-18 DIAGNOSIS — I959 Hypotension, unspecified: Secondary | ICD-10-CM | POA: Diagnosis present

## 2023-01-18 DIAGNOSIS — Z791 Long term (current) use of non-steroidal anti-inflammatories (NSAID): Secondary | ICD-10-CM | POA: Diagnosis not present

## 2023-01-18 DIAGNOSIS — D72818 Other decreased white blood cell count: Secondary | ICD-10-CM | POA: Diagnosis present

## 2023-01-18 DIAGNOSIS — N179 Acute kidney failure, unspecified: Principal | ICD-10-CM | POA: Diagnosis present

## 2023-01-18 DIAGNOSIS — F17290 Nicotine dependence, other tobacco product, uncomplicated: Secondary | ICD-10-CM | POA: Diagnosis present

## 2023-01-18 DIAGNOSIS — Z7982 Long term (current) use of aspirin: Secondary | ICD-10-CM

## 2023-01-18 DIAGNOSIS — Z8249 Family history of ischemic heart disease and other diseases of the circulatory system: Secondary | ICD-10-CM | POA: Diagnosis not present

## 2023-01-18 DIAGNOSIS — R627 Adult failure to thrive: Secondary | ICD-10-CM | POA: Diagnosis present

## 2023-01-18 DIAGNOSIS — E861 Hypovolemia: Secondary | ICD-10-CM | POA: Diagnosis present

## 2023-01-18 DIAGNOSIS — N400 Enlarged prostate without lower urinary tract symptoms: Secondary | ICD-10-CM | POA: Diagnosis present

## 2023-01-18 DIAGNOSIS — I5032 Chronic diastolic (congestive) heart failure: Secondary | ICD-10-CM | POA: Diagnosis present

## 2023-01-18 DIAGNOSIS — Z7984 Long term (current) use of oral hypoglycemic drugs: Secondary | ICD-10-CM | POA: Diagnosis not present

## 2023-01-18 DIAGNOSIS — R531 Weakness: Secondary | ICD-10-CM | POA: Diagnosis present

## 2023-01-18 DIAGNOSIS — R634 Abnormal weight loss: Secondary | ICD-10-CM

## 2023-01-18 DIAGNOSIS — Z79899 Other long term (current) drug therapy: Secondary | ICD-10-CM

## 2023-01-18 DIAGNOSIS — I509 Heart failure, unspecified: Secondary | ICD-10-CM | POA: Diagnosis not present

## 2023-01-18 LAB — CBC WITH DIFFERENTIAL/PLATELET
Abs Immature Granulocytes: 0.01 10*3/uL (ref 0.00–0.07)
Basophils Absolute: 0 10*3/uL (ref 0.0–0.1)
Basophils Relative: 1 %
Eosinophils Absolute: 0.1 10*3/uL (ref 0.0–0.5)
Eosinophils Relative: 4 %
HCT: 39.6 % (ref 39.0–52.0)
Hemoglobin: 13 g/dL (ref 13.0–17.0)
Immature Granulocytes: 0 %
Lymphocytes Relative: 28 %
Lymphs Abs: 1 10*3/uL (ref 0.7–4.0)
MCH: 30.4 pg (ref 26.0–34.0)
MCHC: 32.8 g/dL (ref 30.0–36.0)
MCV: 92.7 fL (ref 80.0–100.0)
Monocytes Absolute: 0.2 10*3/uL (ref 0.1–1.0)
Monocytes Relative: 7 %
Neutro Abs: 2.1 10*3/uL (ref 1.7–7.7)
Neutrophils Relative %: 60 %
Platelets: 150 10*3/uL (ref 150–400)
RBC: 4.27 MIL/uL (ref 4.22–5.81)
RDW: 12.8 % (ref 11.5–15.5)
WBC: 3.6 10*3/uL — ABNORMAL LOW (ref 4.0–10.5)
nRBC: 0 % (ref 0.0–0.2)

## 2023-01-18 LAB — URINALYSIS, ROUTINE W REFLEX MICROSCOPIC
Bilirubin Urine: NEGATIVE
Glucose, UA: NEGATIVE mg/dL
Hgb urine dipstick: NEGATIVE
Ketones, ur: NEGATIVE mg/dL
Leukocytes,Ua: NEGATIVE
Nitrite: NEGATIVE
Protein, ur: NEGATIVE mg/dL
Specific Gravity, Urine: 1.014 (ref 1.005–1.030)
pH: 5 (ref 5.0–8.0)

## 2023-01-18 LAB — CULTURE, BLOOD (ROUTINE X 2): Special Requests: ADEQUATE

## 2023-01-18 LAB — COMPREHENSIVE METABOLIC PANEL
ALT: 12 U/L (ref 0–44)
AST: 16 U/L (ref 15–41)
Albumin: 3.8 g/dL (ref 3.5–5.0)
Alkaline Phosphatase: 65 U/L (ref 38–126)
Anion gap: 10 (ref 5–15)
BUN: 30 mg/dL — ABNORMAL HIGH (ref 8–23)
CO2: 28 mmol/L (ref 22–32)
Calcium: 8.7 mg/dL — ABNORMAL LOW (ref 8.9–10.3)
Chloride: 105 mmol/L (ref 98–111)
Creatinine, Ser: 1.72 mg/dL — ABNORMAL HIGH (ref 0.61–1.24)
GFR, Estimated: 39 mL/min — ABNORMAL LOW (ref >60)
Glucose, Bld: 105 mg/dL — ABNORMAL HIGH (ref 70–99)
Potassium: 4.1 mmol/L (ref 3.5–5.1)
Sodium: 143 mmol/L (ref 135–145)
Total Bilirubin: 0.9 mg/dL (ref 0.3–1.2)
Total Protein: 6.8 g/dL (ref 6.5–8.1)

## 2023-01-18 LAB — CBG MONITORING, ED: Glucose-Capillary: 103 mg/dL — ABNORMAL HIGH (ref 70–99)

## 2023-01-18 LAB — LACTIC ACID, PLASMA: Lactic Acid, Venous: 1 mmol/L (ref 0.5–1.9)

## 2023-01-18 MED ORDER — ENOXAPARIN SODIUM 30 MG/0.3ML IJ SOSY
30.0000 mg | PREFILLED_SYRINGE | INTRAMUSCULAR | Status: DC
  Administered 2023-01-19 – 2023-01-21 (×4): 30 mg via SUBCUTANEOUS
  Filled 2023-01-18 (×4): qty 0.3

## 2023-01-18 MED ORDER — SODIUM CHLORIDE 0.9 % IV BOLUS
500.0000 mL | Freq: Once | INTRAVENOUS | Status: AC
  Administered 2023-01-18: 500 mL via INTRAVENOUS

## 2023-01-18 MED ORDER — DUTASTERIDE 0.5 MG PO CAPS
0.5000 mg | ORAL_CAPSULE | Freq: Every day | ORAL | Status: DC
  Administered 2023-01-19 – 2023-01-24 (×6): 0.5 mg via ORAL
  Filled 2023-01-18 (×6): qty 1

## 2023-01-18 MED ORDER — POLYETHYLENE GLYCOL 3350 17 G PO PACK: 17.0000 g | PACK | Freq: Every day | ORAL | Status: DC | PRN

## 2023-01-18 MED ORDER — ASPIRIN 81 MG PO TBEC
81.0000 mg | DELAYED_RELEASE_TABLET | Freq: Every day | ORAL | Status: DC
  Administered 2023-01-19 – 2023-01-24 (×6): 81 mg via ORAL
  Filled 2023-01-18 (×5): qty 1

## 2023-01-18 MED ORDER — PROCHLORPERAZINE EDISYLATE 10 MG/2ML IJ SOLN: 5.0000 mg | Freq: Four times a day (QID) | INTRAMUSCULAR | Status: DC | PRN

## 2023-01-18 MED ORDER — ACETAMINOPHEN 325 MG PO TABS: 650.0000 mg | ORAL_TABLET | Freq: Four times a day (QID) | ORAL | Status: DC | PRN

## 2023-01-18 MED ORDER — AMLODIPINE BESYLATE 5 MG PO TABS
5.0000 mg | ORAL_TABLET | Freq: Every day | ORAL | Status: DC
  Administered 2023-01-19 – 2023-01-24 (×7): 5 mg via ORAL
  Filled 2023-01-18 (×6): qty 1

## 2023-01-18 MED ORDER — SODIUM CHLORIDE 0.9 % IV SOLN: INTRAVENOUS | Status: AC

## 2023-01-18 MED ORDER — MELATONIN 5 MG PO TABS
5.0000 mg | ORAL_TABLET | Freq: Every evening | ORAL | Status: DC | PRN
  Administered 2023-01-19 – 2023-01-23 (×2): 5 mg via ORAL
  Filled 2023-01-18 (×2): qty 1

## 2023-01-18 NOTE — ED Notes (Signed)
Used restroom upon arrival but urine sample was not collected then

## 2023-01-18 NOTE — ED Provider Notes (Signed)
[place] EMERGENCY DEPARTMENT AT [facility] Provider Note   CSN: [id number] Arrival date & time: 01/18/23  1702     History  Chief Complaint  Patient presents with   Failure To Thrive    Chase Mccarthy is a 82 y.o. male.  Pt is a 82 yo male with pmhx significant for htn, dm, bph, hld, and dementia.  Pt went to his pcp office today and was found to be hypotensive with a bp in the 70s.  EMS was called and gave him 500 cc NS en route.  Pt said he's been feeling weak.  He has not been eating/drinking as much.  Pt lives with his 2nd wife.  His daughter was down visiting from [place].  The daughter said she thinks the step mother is not feeding him.  She is concerned about his care at home.  She is interested in a SW consult.  Daughter said he's lost 14 lbs since he saw his doctor last.       Home Medications Prior to Admission medications   Medication Sig Start Date End Date Taking? Authorizing Provider  Acetaminophen (TYLENOL 8 HOUR PO) Take 500 mg by mouth 2 (two) times daily.    [provider]  amLODipine (NORVASC) 2.5 MG tablet Take 2.5 mg by mouth daily.      [provider]  aspirin 81 MG EC tablet Take 81 mg by mouth daily.      [provider]  dutasteride (AVODART) 0.5 MG capsule Take 0.5 mg by mouth daily.      [provider]  fish oil-omega-3 fatty acids 1000 MG capsule Take 2 g by mouth daily.    [provider]  glimepiride (AMARYL) 1 MG tablet Take 1 mg by mouth daily before breakfast.      [provider]  lisinopril (PRINIVIL,ZESTRIL) 10 MG tablet  07/13/16   [provider]  meloxicam (MOBIC) 15 MG tablet Take 1 tablet (15 mg total) by mouth daily. 07/29/16   Benjiman Core D, PA-C  memantine (NAMENDA) 10 MG tablet Take 10 mg by mouth daily.    [provider]  prednisoLONE acetate (PRED FORTE) 1 % ophthalmic suspension  05/19/16   [provider]      Allergies    Patient has  no known allergies.    Review of Systems   Review of Systems  Constitutional:  Positive for appetite change.  Neurological:  Positive for weakness.    Physical Exam Updated Vital Signs BP (!) 118/55 (BP Location: Right Arm)   Pulse 72   Temp 97.8 F (36.6 C) (Oral)   Resp 18   SpO2 95%  Physical Exam Vitals and nursing note reviewed.  Constitutional:      Appearance: He is underweight.  HENT:     Head: Normocephalic and atraumatic.     Right Ear: External ear normal.     Left Ear: External ear normal.     Nose: Nose normal.     Mouth/Throat:     Mouth: Mucous membranes are moist.     Pharynx: Oropharynx is clear.  Cardiovascular:     Rate and Rhythm: Normal rate and regular rhythm.     Pulses: Normal pulses.     Heart sounds: Normal heart sounds.  Pulmonary:     Effort: Pulmonary effort is normal.     Breath sounds: Normal breath sounds.  Abdominal:     General: Abdomen is flat. Bowel sounds are normal.  Palpations: Abdomen is soft.  Musculoskeletal:        General: Normal range of motion.     Cervical back: Normal range of motion and neck supple.  Skin:    General: Skin is warm.     Capillary Refill: Capillary refill takes less than 2 seconds.  Neurological:     General: No focal deficit present.     Mental Status: He is alert and oriented to person, place, and time.  Psychiatric:        Mood and Affect: Mood normal.        Behavior: Behavior normal.     ED Results / Procedures / Treatments   Labs (all labs ordered are listed, but only abnormal results are displayed) Labs Reviewed  CBC WITH DIFFERENTIAL/PLATELET - Abnormal; Notable for the following components:      Result Value   WBC 3.6 (*)    All other components within normal limits  COMPREHENSIVE METABOLIC PANEL - Abnormal; Notable for the following components:   Glucose, Bld 105 (*)    BUN 30 (*)    Creatinine, Ser 1.72 (*)    Calcium 8.7 (*)    GFR, Estimated 39 (*)    All other components  within normal limits  CBG MONITORING, ED - Abnormal; Notable for the following components:   Glucose-Capillary 103 (*)    All other components within normal limits  CULTURE, BLOOD (ROUTINE X 2)  CULTURE, BLOOD (ROUTINE X 2)  URINALYSIS, ROUTINE W REFLEX MICROSCOPIC  LACTIC ACID, PLASMA  CBC  CREATININE, SERUM  CBC  BASIC METABOLIC PANEL  MAGNESIUM  PHOSPHORUS    EKG EKG Interpretation  Date/Time:  Thursday Jan 18 2023 17:30:19 EDT Ventricular Rate:  65 PR Interval:  160 QRS Duration: 105 QT Interval:  411 QTC Calculation: 428 R Axis:   61 Text Interpretation: Sinus rhythm RSR' in V1 or V2, right VCD or RVH Minimal ST elevation, anterior leads No old tracing to compare Confirmed by Jacalyn Lefevre [phone]) on 01/18/2023 5:44:15 PM  Radiology No results found.  Procedures Procedures    Medications Ordered in ED Medications  enoxaparin (LOVENOX) injection 40 mg (has no administration in time range)  0.9 %  sodium chloride infusion (has no administration in time range)  sodium chloride 0.9 % bolus 500 mL (500 mLs Intravenous New Bag/Given 01/18/23 2120)    ED Course/ Medical Decision Making/ A&P                             Medical Decision Making Amount and/or Complexity of Data Reviewed Labs: ordered. Radiology: ordered.  Risk Decision regarding hospitalization.   This patient presents to the ED for concern of ams, this involves an extensive number of treatment options, and is a complaint that carries with it a high risk of complications and morbidity.  The differential diagnosis includes dehydration, infection, electrolyte abn   Co morbidities that complicate the patient evaluation  htn, dm, bph, hld, and dementia   Additional history obtained:  Additional history obtained from epic chart review External records from outside source obtained and reviewed including EMS report   Lab Tests:  I Ordered, and personally interpreted labs.  The pertinent results  include:  cbc nl, cmp with bun 30 and cr 1.72 (bun/cr nl several years ago).  No recent labs.   Imaging Studies ordered:  I ordered imaging studies including cxr and ct head I independently visualized and interpreted imaging which  showed  CT head: No acute intracranial pathology.  CXR: No acute abnormalities.  Aortic Atherosclerosis (ICD10-I70.0).  I agree with the radiologist interpretation   Cardiac Monitoring:  The patient was maintained on a cardiac monitor.  I personally viewed and interpreted the cardiac monitored which showed an underlying rhythm of: nsr   Medicines ordered and prescription drug management:  I ordered medication including ivfs  for dehydration  Reevaluation of the patient after these medicines showed that the patient improved I have reviewed the patients home medicines and have made adjustments as needed   Test Considered:  ct   Critical Interventions:  ivfs   Consultations Obtained:  I requested consultation with the hospitalist (Dr. Delma Post),  and discussed lab and imaging findings as well as pertinent plan - she will admit   Problem List / ED Course:  FTT with unintentional weight loss:  concern whether wife is feeding patient.  I tried calling the wife, but it's the incorrect number in the chart.  I also tried calling daughter after she left and her VM is full. AKI:  no new labs, but he is clinically dehydrated and was hypotensive in pcp office.     Reevaluation:  After the interventions noted above, I reevaluated the patient and found that they have :improved   Social Determinants of Health:  Lives at home with wife   Dispostion:  After consideration of the diagnostic results and the patients response to treatment, I feel that the patent would benefit from admission.          Final Clinical Impression(s) / ED Diagnoses Final diagnoses:  AKI (acute kidney injury) (HCC)  Failure to thrive in adult  Weight loss,  unintentional  Moderate dementia without behavioral disturbance, psychotic disturbance, mood disturbance, or anxiety, unspecified dementia type [facility])    Rx / DC Orders ED Discharge Orders     None         Jacalyn Lefevre, MD 01/18/23 2123
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
## 2023-01-18 NOTE — ED Triage Notes (Signed)
Patient presents from his MD office due to increasingly worse weakness and dementia. He was also hypotensive at 70/509 while in the MD office. EMS administered 500 ml of fluids. Patient reports decreased PO intake.    EMS vitals: 112/73 BP initial 128/72 BP post fluid 65 HR 96 % SPO2 on room air 140 CBG

## 2023-01-19 ENCOUNTER — Inpatient Hospital Stay (HOSPITAL_COMMUNITY): Payer: Medicare HMO

## 2023-01-19 DIAGNOSIS — F03B0 Unspecified dementia, moderate, without behavioral disturbance, psychotic disturbance, mood disturbance, and anxiety: Secondary | ICD-10-CM

## 2023-01-19 DIAGNOSIS — R627 Adult failure to thrive: Secondary | ICD-10-CM

## 2023-01-19 DIAGNOSIS — E43 Unspecified severe protein-calorie malnutrition: Secondary | ICD-10-CM | POA: Diagnosis not present

## 2023-01-19 DIAGNOSIS — R634 Abnormal weight loss: Secondary | ICD-10-CM | POA: Diagnosis not present

## 2023-01-19 DIAGNOSIS — I509 Heart failure, unspecified: Secondary | ICD-10-CM | POA: Diagnosis not present

## 2023-01-19 DIAGNOSIS — N179 Acute kidney failure, unspecified: Secondary | ICD-10-CM | POA: Diagnosis not present

## 2023-01-19 LAB — BASIC METABOLIC PANEL
Anion gap: 7 (ref 5–15)
BUN: 26 mg/dL — ABNORMAL HIGH (ref 8–23)
CO2: 27 mmol/L (ref 22–32)
Calcium: 8.8 mg/dL — ABNORMAL LOW (ref 8.9–10.3)
Chloride: 109 mmol/L (ref 98–111)
Creatinine, Ser: 1.44 mg/dL — ABNORMAL HIGH (ref 0.61–1.24)
GFR, Estimated: 48 mL/min — ABNORMAL LOW (ref >60)
Glucose, Bld: 131 mg/dL — ABNORMAL HIGH (ref 70–99)
Potassium: 3.7 mmol/L (ref 3.5–5.1)
Sodium: 143 mmol/L (ref 135–145)

## 2023-01-19 LAB — ECHOCARDIOGRAM COMPLETE
Area-P 1/2: 3.01 cm2
Height: 68 in
MV VTI: 2.78 cm2
S' Lateral: 2.2 cm
Weight: 1824 oz

## 2023-01-19 LAB — MAGNESIUM: Magnesium: 1.9 mg/dL (ref 1.7–2.4)

## 2023-01-19 LAB — CBC
HCT: 36.6 % — ABNORMAL LOW (ref 39.0–52.0)
Hemoglobin: 12.1 g/dL — ABNORMAL LOW (ref 13.0–17.0)
MCH: 29.8 pg (ref 26.0–34.0)
MCHC: 33.1 g/dL (ref 30.0–36.0)
MCV: 90.1 fL (ref 80.0–100.0)
Platelets: 145 10*3/uL — ABNORMAL LOW (ref 150–400)
RBC: 4.06 MIL/uL — ABNORMAL LOW (ref 4.22–5.81)
RDW: 12.5 % (ref 11.5–15.5)
WBC: 3.8 10*3/uL — ABNORMAL LOW (ref 4.0–10.5)
nRBC: 0 % (ref 0.0–0.2)

## 2023-01-19 LAB — CULTURE, BLOOD (ROUTINE X 2): Special Requests: ADEQUATE

## 2023-01-19 LAB — PHOSPHORUS: Phosphorus: 2.3 mg/dL — ABNORMAL LOW (ref 2.5–4.6)

## 2023-01-19 LAB — GLUCOSE, CAPILLARY: Glucose-Capillary: 102 mg/dL — ABNORMAL HIGH (ref 70–99)

## 2023-01-19 LAB — BRAIN NATRIURETIC PEPTIDE: B Natriuretic Peptide: 100.1 pg/mL — ABNORMAL HIGH (ref 0.0–100.0)

## 2023-01-19 MED ORDER — ADULT MULTIVITAMIN W/MINERALS CH
1.0000 | ORAL_TABLET | Freq: Every day | ORAL | Status: DC
  Administered 2023-01-19 – 2023-01-24 (×6): 1 via ORAL
  Filled 2023-01-19 (×5): qty 1

## 2023-01-19 MED ORDER — HALOPERIDOL LACTATE 5 MG/ML IJ SOLN
5.0000 mg | Freq: Four times a day (QID) | INTRAMUSCULAR | Status: DC | PRN
  Administered 2023-01-19 – 2023-01-24 (×7): 5 mg via INTRAVENOUS
  Filled 2023-01-19 (×7): qty 1

## 2023-01-19 MED ORDER — MEMANTINE HCL 10 MG PO TABS
10.0000 mg | ORAL_TABLET | Freq: Every day | ORAL | Status: DC
  Administered 2023-01-19 – 2023-01-24 (×6): 10 mg via ORAL
  Filled 2023-01-19 (×5): qty 1

## 2023-01-19 MED ORDER — ENSURE ENLIVE PO LIQD
237.0000 mL | Freq: Two times a day (BID) | ORAL | Status: DC
  Administered 2023-01-19 – 2023-01-23 (×6): 237 mL via ORAL

## 2023-01-19 NOTE — Evaluation (Signed)
Physical Therapy Evaluation Patient Details Name: Chase Mccarthy MRN: [id number] DOB: 03-27-1941 Today's Date: 01/19/2023  History of Present Illness  Chase Mccarthy is a 82 y.o. male sent from PCPs office due to hypotension and concern for dehydration, generalized weakness, and failure to thrive, admitted with AKI. PMH: advanced dementia, hypertension, hyperlipidemia, type 2 diabetes  Clinical Impression  Pt admitted with above diagnosis. Pt without family present to assist with PLOF and home setup questions. Unsure of pt's baseline, but currently pt oriented to self only, tangential with speech, difficult to redirect, tangential with speech, not following commands consistently. Pt needing initiation cues and multimodal cues to complete tasks, difficult to cue with taking steps, unsteady and unable to cue with using RW so therapist proving close min guard for safety. Unsure of current DME at home and family assistance, will continue to assess and adjust d/c recommendations as needed. Pt currently with functional limitations due to the deficits listed below (see PT Problem List). Pt will benefit from acute skilled PT to increase their independence and safety with mobility to allow discharge.          Recommendations for follow up therapy are one component of a multi-disciplinary discharge planning process, led by the attending physician.  Recommendations may be updated based on patient status, additional functional criteria and insurance authorization.  Follow Up Recommendations Can patient physically be transported by private vehicle: No     Assistance Recommended at Discharge Frequent or constant Supervision/Assistance  Patient can return home with the following  A little help with walking and/or transfers;A little help with bathing/dressing/bathroom;Assistance with cooking/housework;Assist for transportation;Help with stairs or ramp for entrance    Equipment Recommendations Other (comment)  (TBD)  Recommendations for Other Services       Functional Status Assessment       Precautions / Restrictions Precautions Precautions: Fall Restrictions Weight Bearing Restrictions: No      Mobility  Bed Mobility Overal bed mobility: Needs Assistance Bed Mobility: Supine to Sit, Sit to Supine  Supine to sit: Min assist Sit to supine: Mod assist  General bed mobility comments: heavy initiation cues and multimodal cues for task, min A to upright trunk and scoot out to EOB, mod A to return to supine with pt appearing to lack understanding of task    Transfers Overall transfer level: Needs assistance Equipment used: 1 person hand held assist Transfers: Sit to/from Stand Sit to Stand: Min assist  General transfer comment: min A to steady with powering up to stand, unable to cue with RW so therapist providing close guard    Ambulation/Gait  General Gait Details: pt fixated on hospital gown, unable to cue with steps using RW  Stairs            Wheelchair Mobility    Modified Rankin (Stroke Patients Only)       Balance Overall balance assessment: Needs assistance   Sitting balance-Leahy Scale: Good  Standing balance support: During functional activity, Bilateral upper extremity supported Standing balance-Leahy Scale: Poor       Pertinent Vitals/Pain Pain Assessment Pain Assessment: Faces Faces Pain Scale: No hurt    Home Living Family/patient expects to be discharged to:: Private residence Living Arrangements: Spouse/significant other  Additional Comments: per chart review, pt from home with spouse, pt unable to provide PLOF or home setup information    Prior Function Prior Level of Function : Patient poor historian/Family not available        Hand Dominance  Extremity/Trunk Assessment   Upper Extremity Assessment Upper Extremity Assessment: Defer to OT evaluation    Lower Extremity Assessment Lower Extremity Assessment: Generalized  weakness (AROM WFL, strength grossly 3+/5, unable to formally MMT due to cognition)    Cervical / Trunk Assessment Cervical / Trunk Assessment: Kyphotic  Communication      Cognition Arousal/Alertness: Awake/alert Behavior During Therapy: Restless Overall Cognitive Status: History of cognitive impairments - at baseline  General Comments: pt oriented to self only, difficult to orient and redirect, tangential with speech, hands fixated on holding onto card or hospital gown, doesn't follow commands consistently        General Comments      Exercises     Assessment/Plan    PT Assessment Patient needs continued PT services  PT Problem List Decreased strength;Decreased activity tolerance;Decreased balance;Decreased coordination;Decreased cognition;Decreased knowledge of use of DME;Decreased safety awareness       PT Treatment Interventions DME instruction;Gait training;Functional mobility training;Therapeutic activities;Therapeutic exercise;Balance training;Neuromuscular re-education;Cognitive remediation;Patient/family education    PT Goals (Current goals can be found in the Care Plan section)  Acute Rehab PT Goals Patient Stated Goal: none stated PT Goal Formulation: Patient unable to participate in goal setting Time For Goal Achievement: 02/02/23 Potential to Achieve Goals: Fair    Frequency Min 1X/week     Co-evaluation               AM-PAC PT "6 Clicks" Mobility  Outcome Measure Help needed turning from your back to your side while in a flat bed without using bedrails?: A Little Help needed moving from lying on your back to sitting on the side of a flat bed without using bedrails?: A Little Help needed moving to and from a bed to a chair (including a wheelchair)?: A Lot Help needed standing up from a chair using your arms (e.g., wheelchair or bedside chair)?: A Little Help needed to walk in hospital room?: Total Help needed climbing 3-5 steps with a railing? :  Total 6 Click Score: 13    End of Session   Activity Tolerance:  (difficult to redirect/reorient) Patient left: in bed;with call bell/phone within reach;with bed alarm set;with nursing/sitter in room Nurse Communication: Mobility status PT Visit Diagnosis: Unsteadiness on feet (R26.81);Other abnormalities of gait and mobility (R26.89);Muscle weakness (generalized) (M62.81)    Time: 1610-9604 PT Time Calculation (min) (ACUTE ONLY): 11 min   Charges:   PT Evaluation $PT Eval Low Complexity: 1 Low           Tori Courteny Egler PT, DPT 01/19/23, 10:39 AM
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
## 2023-01-19 NOTE — Progress Notes (Signed)
Initial Nutrition Assessment  DOCUMENTATION CODES:   Severe malnutrition in context of chronic illness, Underweight  INTERVENTION:   Monitor magnesium, potassium, and phosphorus for at least 3 days, MD to replete as needed, as pt is at risk for refeeding syndrome.  -Ensure Plus High Protein po BID, each supplement provides 350 kcal and 20 grams of protein.   -Multivitamin with minerals daily  -Liberalize diet to regular given malnutrition and poor PO  NUTRITION DIAGNOSIS:   Severe Malnutrition related to chronic illness (advanced dementia) as evidenced by severe fat depletion, severe muscle depletion.  GOAL:   Patient will meet greater than or equal to 90% of their needs   MONITOR:   PO intake, Supplement acceptance, Labs, Weight trends, I & O's  REASON FOR ASSESSMENT:   Consult Assessment of nutrition requirement/status  ASSESSMENT:   82 y.o. male with medical history significant for advanced dementia, hypertension, hyperlipidemia, type 2 diabetes, who presented to [facility] ED sent from PCPs office due to hypotension and concern for dehydration, generalized weakness, and failure to thrive.  Patient in room, sitter at bedside. Pt attempts to answer questions but mumbles most answers. Not able to give clear history. States his meals have been late. Per nurse tech, pt has not been eating or drinking anything. States she will try and encourage him to eat lunch. Will order Ensure supplements  as well for additional kcals and protein.   Per weight records, pt's weight has decreased since 2018 but not sure of recent time for weight changes.   Medications reviewed.  Labs reviewed: CBGs: 103 Low Phos   NUTRITION - FOCUSED PHYSICAL EXAM:  Flowsheet Row Most Recent Value  Orbital Region Severe depletion  Upper Arm Region Severe depletion  Thoracic and Lumbar Region Unable to assess  Buccal Region Severe depletion  Temple Region Severe depletion  Clavicle Bone Region Severe  depletion  Clavicle and Acromion Bone Region Severe depletion  Scapular Bone Region Unable to assess  Dorsal Hand Severe depletion  Patellar Region Severe depletion  Anterior Thigh Region Severe depletion  Posterior Calf Region Severe depletion  Edema (RD Assessment) None  Hair Unable to assess  Eyes Reviewed  Mouth Unable to assess  Skin Reviewed  Nails Unable to assess       Diet Order:   Diet Order             Diet regular Room service appropriate? Yes; Fluid consistency: Thin  Diet effective now                   EDUCATION NEEDS:   Not appropriate for education at this time  Skin:  Skin Assessment: Reviewed RN Assessment  Last BM:  5/9  Height:   Ht Readings from Last 1 Encounters:  01/18/23 5\' 8"  (1.727 m)    Weight:   Wt Readings from Last 1 Encounters:  01/18/23 51.7 kg    BMI:  Body mass index is 17.33 kg/m.  Estimated Nutritional Needs:   Kcal:  1600-1800  Protein:  75-90g  Fluid:  1.6L/day   Tilda Franco, MS, RD, LDN Inpatient Clinical Dietitian Contact information available via Amion
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
## 2023-01-19 NOTE — Progress Notes (Signed)
OT Cancellation Note  Patient Details Name: Chase Mccarthy MRN: [id number] DOB: 09/15/1940   Cancelled Treatment:    Reason Eval/Treat Not Completed: Other (comment). Nurse requested for OT to hold off on the evaluation, as the pt has been with increased restlessness and confusion this afternoon.    Reuben Likes, OTR/L 01/19/2023, 3:35 PM
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
## 2023-01-19 NOTE — Progress Notes (Signed)
  Echocardiogram 2D Echocardiogram has been performed.  Milda Smart 01/19/2023, 1:19 PM

## 2023-01-20 DIAGNOSIS — F03B0 Unspecified dementia, moderate, without behavioral disturbance, psychotic disturbance, mood disturbance, and anxiety: Secondary | ICD-10-CM | POA: Diagnosis not present

## 2023-01-20 DIAGNOSIS — N179 Acute kidney failure, unspecified: Secondary | ICD-10-CM | POA: Diagnosis not present

## 2023-01-20 DIAGNOSIS — E43 Unspecified severe protein-calorie malnutrition: Secondary | ICD-10-CM | POA: Diagnosis not present

## 2023-01-20 DIAGNOSIS — R627 Adult failure to thrive: Secondary | ICD-10-CM | POA: Diagnosis not present

## 2023-01-20 LAB — BASIC METABOLIC PANEL
Anion gap: 6 (ref 5–15)
BUN: 17 mg/dL (ref 8–23)
CO2: 26 mmol/L (ref 22–32)
Calcium: 8.6 mg/dL — ABNORMAL LOW (ref 8.9–10.3)
Chloride: 107 mmol/L (ref 98–111)
Creatinine, Ser: 1.37 mg/dL — ABNORMAL HIGH (ref 0.61–1.24)
GFR, Estimated: 51 mL/min — ABNORMAL LOW (ref >60)
Glucose, Bld: 138 mg/dL — ABNORMAL HIGH (ref 70–99)
Potassium: 3.5 mmol/L (ref 3.5–5.1)
Sodium: 139 mmol/L (ref 135–145)

## 2023-01-20 LAB — CULTURE, BLOOD (ROUTINE X 2)
Culture: NO GROWTH
Culture: NO GROWTH

## 2023-01-20 LAB — GLUCOSE, CAPILLARY: Glucose-Capillary: 139 mg/dL — ABNORMAL HIGH (ref 70–99)

## 2023-01-20 LAB — HEMOGLOBIN A1C
Hgb A1c MFr Bld: 6.3 % — ABNORMAL HIGH (ref 4.8–5.6)
Mean Plasma Glucose: 134.11 mg/dL
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
## 2023-01-20 NOTE — Plan of Care (Signed)
  Problem: Education: Goal: Knowledge of General Education information will improve Description Including pain rating scale, medication(s)/side effects and non-pharmacologic comfort measures Outcome: Progressing   Problem: Health Behavior/Discharge Planning: Goal: Ability to manage health-related needs will improve Outcome: Progressing   

## 2023-01-20 NOTE — Progress Notes (Signed)
OT Cancellation Note  Patient Details Name: Chase Mccarthy MRN: [id number] DOB: [birth year]   Cancelled Treatment:    Reason Eval/Treat Not Completed: Fatigue/lethargy limiting ability to participate Patient in bed asleep at this time. OT to continue to follow and check back as schedule will allow.  Rosalio Loud, MS Acute Rehabilitation Department Office# [phone]  01/20/2023, 1:22 PM

## 2023-01-21 DIAGNOSIS — F03B0 Unspecified dementia, moderate, without behavioral disturbance, psychotic disturbance, mood disturbance, and anxiety: Secondary | ICD-10-CM | POA: Diagnosis not present

## 2023-01-21 DIAGNOSIS — E43 Unspecified severe protein-calorie malnutrition: Secondary | ICD-10-CM | POA: Diagnosis not present

## 2023-01-21 DIAGNOSIS — R627 Adult failure to thrive: Secondary | ICD-10-CM | POA: Diagnosis not present

## 2023-01-21 DIAGNOSIS — N179 Acute kidney failure, unspecified: Secondary | ICD-10-CM | POA: Diagnosis not present

## 2023-01-21 LAB — BASIC METABOLIC PANEL
Anion gap: 9 (ref 5–15)
BUN: 15 mg/dL (ref 8–23)
CO2: 23 mmol/L (ref 22–32)
Calcium: 8.7 mg/dL — ABNORMAL LOW (ref 8.9–10.3)
Chloride: 110 mmol/L (ref 98–111)
Creatinine, Ser: 1.35 mg/dL — ABNORMAL HIGH (ref 0.61–1.24)
GFR, Estimated: 52 mL/min — ABNORMAL LOW (ref >60)
Glucose, Bld: 92 mg/dL (ref 70–99)
Potassium: 3.8 mmol/L (ref 3.5–5.1)
Sodium: 142 mmol/L (ref 135–145)

## 2023-01-21 LAB — GLUCOSE, CAPILLARY: Glucose-Capillary: 125 mg/dL — ABNORMAL HIGH (ref 70–99)

## 2023-01-21 LAB — CULTURE, BLOOD (ROUTINE X 2)

## 2023-01-21 MED ORDER — SODIUM CHLORIDE 0.9 % IV SOLN: INTRAVENOUS | Status: DC

## 2023-01-21 NOTE — Evaluation (Signed)
Occupational Therapy Evaluation Patient Details Name: Chase Mccarthy MRN: [id number] DOB: [birth year] Today's Date: 01/21/2023   History of Present Illness Chase Mccarthy is a 82 y.o. male sent from PCPs office due to hypotension and concern for dehydration, generalized weakness, and failure to thrive, admitted with AKI. PMH: advanced dementia, hypertension, hyperlipidemia, type 2 diabetes   Clinical Impression   Patient is a 82 year old male who was admitted for above. Patient  reported living at home with children and wife, patient unable to give much information during session with continued confusion. No family available at this time. Patient was noted to have decreased functional activity tolerance, decreased endurance, decreased standing balance, decreased safety awareness, and decreased knowledge of AD/AE impacting participation in ADLs. Patient would continue to benefit from skilled OT services at this time while admitted and after d/c to address noted deficits in order to improve overall safety and independence in ADLs.       Recommendations for follow up therapy are one component of a multi-disciplinary discharge planning process, led by the attending physician.  Recommendations may be updated based on patient status, additional functional criteria and insurance authorization.   Assistance Recommended at Discharge Frequent or constant Supervision/Assistance  Patient can return home with the following A little help with walking and/or transfers;A little help with bathing/dressing/bathroom;Assistance with cooking/housework;Direct supervision/assist for medications management;Assist for transportation;Help with stairs or ramp for entrance;Direct supervision/assist for financial management    Functional Status Assessment  Patient has had a recent decline in their functional status and demonstrates the ability to make significant improvements in function in a reasonable and predictable amount of  time.  Equipment Recommendations  None recommended by OT       Precautions / Restrictions Precautions Precautions: Fall Restrictions Weight Bearing Restrictions: No      Mobility Bed Mobility Overal bed mobility: Needs Assistance Bed Mobility: Supine to Sit, Sit to Supine     Supine to sit: Min guard Sit to supine: Min guard   General bed mobility comments: cues to slow down with movements.           Balance Overall balance assessment: Needs assistance   Sitting balance-Leahy Scale: Good     Standing balance support: During functional activity, Single extremity supported Standing balance-Leahy Scale: Fair Standing balance comment: impulsive and poor safety                           ADL either performed or assessed with clinical judgement   ADL Overall ADL's : Needs assistance/impaired Eating/Feeding: Supervision/ safety;Bed level Eating/Feeding Details (indicate cue type and reason): sitting in bed drinking ensure. Grooming: Wash/dry hands;Standing;Min guard Grooming Details (indicate cue type and reason): standing at sink in bathroom with patient using toilet tissue and not attending to task. patient was hard to redirect to task with fixation on wiping mouth with toilet tissues. Upper Body Bathing: Minimal assistance;Sitting   Lower Body Bathing: Minimal assistance;Sitting/lateral leans   Upper Body Dressing : Minimal assistance;Sitting   Lower Body Dressing: Minimal assistance;Sitting/lateral leans   Toilet Transfer: Minimal assistance;Ambulation Toilet Transfer Details (indicate cue type and reason): patient declined to attempt to use IV pole or RW. patient was noted to have small shuffle steps to get from the bed to [facility] in bathroom. Toileting- Clothing Manipulation and Hygiene: Min guard Toileting - Clothing Manipulation Details (indicate cue type and reason): standing for urination with constant safety cues.  Vision    Additional Comments: patient has glasses in room with patient reporting that he only wears them to drive and read.            Pertinent Vitals/Pain Pain Assessment Pain Assessment: Faces Faces Pain Scale: No hurt        Extremity/Trunk Assessment Upper Extremity Assessment Upper Extremity Assessment: Difficult to assess due to impaired cognition   Lower Extremity Assessment Lower Extremity Assessment: Defer to PT evaluation   Cervical / Trunk Assessment Cervical / Trunk Assessment: Kyphotic   Communication     Cognition Arousal/Alertness: Awake/alert Behavior During Therapy: Restless Overall Cognitive Status: History of cognitive impairments - at baseline           General Comments: patient is oriented to self, patient is tangential with speech and constantly seeking items to fidget with. patient noted to have very poor safety awareness during session.                Home Living Family/patient expects to be discharged to:: Private residence Living Arrangements: Spouse/significant other     Additional Comments: per chart review, pt from home with spouse, pt unable to provide PLOF or home setup information      Prior Functioning/Environment Prior Level of Function : Patient poor historian/Family not available            OT Problem List: Decreased activity tolerance;Impaired balance (sitting and/or standing);Decreased coordination;Decreased safety awareness;Decreased knowledge of precautions;Decreased knowledge of use of DME or AE      OT Treatment/Interventions: Self-care/ADL training;Energy conservation;Therapeutic exercise;DME and/or AE instruction;Therapeutic activities;Patient/family education;Balance training    OT Goals(Current goals can be found in the care plan section) Acute Rehab OT Goals Patient Stated Goal: to go to bathroom OT Goal Formulation: Patient unable to participate in goal setting Time For Goal Achievement: 02/04/23 Potential to  Achieve Goals: Fair  OT Frequency: Min 2X/week       AM-PAC OT "6 Clicks" Daily Activity     Outcome Measure Help from another person eating meals?: A Little Help from another person taking care of personal grooming?: A Little Help from another person toileting, which includes using toliet, bedpan, or urinal?: A Little Help from another person bathing (including washing, rinsing, drying)?: A Little Help from another person to put on and taking off regular upper body clothing?: A Little Help from another person to put on and taking off regular lower body clothing?: A Lot 6 Click Score: 17   End of Session Equipment Utilized During Treatment: Gait belt Nurse Communication: Mobility status  Activity Tolerance: Patient tolerated treatment well Patient left: in bed;with call bell/phone within reach;with bed alarm set  OT Visit Diagnosis: Unsteadiness on feet (R26.81);Other abnormalities of gait and mobility (R26.89);Muscle weakness (generalized) (M62.81)                Time: 1610-9604 OT Time Calculation (min): 20 min Charges:  OT General Charges $OT Visit: 1 Visit OT Evaluation $OT Eval Low Complexity: 1 Low  Halaina Vanduzer OTR/L, MS Acute Rehabilitation Department Office# [phone]   Selinda Flavin 01/21/2023, 4:20 PM
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
## 2023-01-22 DIAGNOSIS — N179 Acute kidney failure, unspecified: Secondary | ICD-10-CM | POA: Diagnosis not present

## 2023-01-22 DIAGNOSIS — F03B0 Unspecified dementia, moderate, without behavioral disturbance, psychotic disturbance, mood disturbance, and anxiety: Secondary | ICD-10-CM | POA: Diagnosis not present

## 2023-01-22 DIAGNOSIS — R627 Adult failure to thrive: Secondary | ICD-10-CM | POA: Diagnosis not present

## 2023-01-22 DIAGNOSIS — E43 Unspecified severe protein-calorie malnutrition: Secondary | ICD-10-CM | POA: Diagnosis not present

## 2023-01-22 LAB — BASIC METABOLIC PANEL
Anion gap: 5 (ref 5–15)
BUN: 19 mg/dL (ref 8–23)
CO2: 26 mmol/L (ref 22–32)
Calcium: 8.3 mg/dL — ABNORMAL LOW (ref 8.9–10.3)
Chloride: 107 mmol/L (ref 98–111)
Creatinine, Ser: 1.27 mg/dL — ABNORMAL HIGH (ref 0.61–1.24)
GFR, Estimated: 56 mL/min — ABNORMAL LOW (ref >60)
Glucose, Bld: 93 mg/dL (ref 70–99)
Potassium: 3.9 mmol/L (ref 3.5–5.1)
Sodium: 138 mmol/L (ref 135–145)

## 2023-01-22 LAB — CBC
HCT: 32.7 % — ABNORMAL LOW (ref 39.0–52.0)
Hemoglobin: 10.9 g/dL — ABNORMAL LOW (ref 13.0–17.0)
MCH: 30 pg (ref 26.0–34.0)
MCHC: 33.3 g/dL (ref 30.0–36.0)
MCV: 90.1 fL (ref 80.0–100.0)
Platelets: 141 10*3/uL — ABNORMAL LOW (ref 150–400)
RBC: 3.63 MIL/uL — ABNORMAL LOW (ref 4.22–5.81)
RDW: 12.8 % (ref 11.5–15.5)
WBC: 3.2 10*3/uL — ABNORMAL LOW (ref 4.0–10.5)
nRBC: 0 % (ref 0.0–0.2)

## 2023-01-22 LAB — GLUCOSE, CAPILLARY: Glucose-Capillary: 90 mg/dL (ref 70–99)

## 2023-01-22 MED ORDER — ENOXAPARIN SODIUM 40 MG/0.4ML IJ SOSY
40.0000 mg | PREFILLED_SYRINGE | INTRAMUSCULAR | Status: DC
  Administered 2023-01-22 – 2023-01-23 (×2): 40 mg via SUBCUTANEOUS
  Filled 2023-01-22 (×2): qty 0.4

## 2023-01-22 MED ORDER — QUETIAPINE FUMARATE 50 MG PO TABS
50.0000 mg | ORAL_TABLET | Freq: Every day | ORAL | Status: DC
  Administered 2023-01-22 – 2023-01-23 (×2): 50 mg via ORAL
  Filled 2023-01-22 (×2): qty 1

## 2023-01-22 NOTE — Plan of Care (Signed)
  Problem: Clinical Measurements: Goal: Ability to maintain clinical measurements within normal limits will improve Outcome: Progressing Goal: Will remain free from infection Outcome: Progressing Goal: Diagnostic test results will improve Outcome: Progressing Goal: Respiratory complications will improve Outcome: Progressing Goal: Cardiovascular complication will be avoided Outcome: Progressing   Problem: Activity: Goal: Risk for activity intolerance will decrease Outcome: Progressing   Problem: Nutrition: Goal: Adequate nutrition will be maintained Outcome: Progressing   Problem: Pain Managment: Goal: General experience of comfort will improve Outcome: Progressing   Problem: Safety: Goal: Ability to remain free from injury will improve Outcome: Progressing   Problem: Skin Integrity: Goal: Risk for impaired skin integrity will decrease Outcome: Progressing   

## 2023-01-22 NOTE — Care Management Important Message (Signed)
Important Message  Patient Details IM Letter given. Name: QUINTEZ SOBIECK MRN: [id number] Date of Birth: 1941/07/24   Medicare Important Message Given:  Yes     Caren Macadam 01/22/2023, 9:53 AM

## 2023-01-22 NOTE — [place] FL2 (Signed)
[place] MEDICAID FL2 LEVEL OF CARE FORM     IDENTIFICATION  Patient Name: Chase Mccarthy Birthdate: 11-19-1940 Sex: male Admission Date (Current Location): 01/18/2023  [facility] and [place] Number:  Producer, television/film/video and Address:  [facility],  [address] [place], [place] [address]      Provider Number: [id number]  Attending Physician Name and Address:  Hollice Espy, MD  Relative Name and Phone Number:  Maddux Szot [phone]    Current Level of Care: Hospital Recommended Level of Care: Skilled Nursing Facility Prior Approval Number:    Date Approved/Denied:   PASRR Number:    Discharge Plan: SNF    Current Diagnoses: Patient Active Problem List   Diagnosis Date Noted   Protein-calorie malnutrition, severe 01/19/2023   AKI (acute kidney injury) (HCC) 01/18/2023   Hypertension    Diabetes mellitus (HCC)    Restrictive lung disease    Onychomycosis    Essential hypertension 01/13/2011   Diabetes mellitus type 2 in nonobese (HCC) 01/13/2011   Vitamin D deficiency 08/17/2009   Benign prostatic hypertrophy with urinary obstruction 01/13/2004   Hyperlipidemia with target LDL less than 100 10/28/1999   Tobacco abuse 10/15/1992    Orientation RESPIRATION BLADDER Height & Weight     Self (self only)  Normal Incontinent Weight: 51.7 kg Height:  5\' 8"  (172.7 cm)  BEHAVIORAL SYMPTOMS/MOOD NEUROLOGICAL BOWEL NUTRITION STATUS     (n/a) Incontinent Diet  AMBULATORY STATUS COMMUNICATION OF NEEDS Skin   Limited Assist Verbally Normal                       Personal Care Assistance Level of Assistance  Bathing, Feeding, Dressing Bathing Assistance: Limited assistance Feeding assistance: Limited assistance Dressing Assistance: Limited assistance     Functional Limitations Info  Sight, Hearing, Speech Sight Info: Impaired Hearing Info: Adequate Speech Info: Adequate    SPECIAL CARE FACTORS FREQUENCY  PT (By licensed PT), OT (By licensed OT)      PT Frequency: 5X/wk OT Frequency: 5X/wk            Contractures Contractures Info: Not present    Additional Factors Info  Code Status, Allergies, Psychotropic, Insulin Sliding Scale, Isolation Precautions, Suctioning Needs Code Status Info: Full Allergies Info: No known allergies Psychotropic Info: see discharge summary Insulin Sliding Scale Info: see discharge summary Isolation Precautions Info: n/a Suctioning Needs: n/a   Current Medications (01/22/2023):  This is the current hospital active medication list Current Facility-Administered Medications  Medication Dose Route Frequency Provider Last Rate Last Admin   0.9 %  sodium chloride infusion   Intravenous Continuous Hollice Espy, MD 75 mL/hr at 01/22/23 0508 Restarted at 01/22/23 5621   acetaminophen (TYLENOL) tablet 650 mg  650 mg Oral Q6H PRN Dow Adolph N, DO       amLODipine (NORVASC) tablet 5 mg  5 mg Oral Daily Osage, Carole N, DO   5 mg at 01/21/23 1058   aspirin EC tablet 81 mg  81 mg Oral Daily Darlin Drop, DO   81 mg at 01/21/23 1057   dutasteride (AVODART) capsule 0.5 mg  0.5 mg Oral Daily Dow Adolph N, DO   0.5 mg at 01/22/23 0842   enoxaparin (LOVENOX) injection 30 mg  30 mg Subcutaneous Q24H Hall, Carole N, DO   30 mg at 01/21/23 2101   feeding supplement (ENSURE ENLIVE / ENSURE PLUS) liquid 237 mL  237 mL Oral BID BM Hollice Espy,  MD   237 mL at 01/21/23 1430   haloperidol lactate (HALDOL) injection 5 mg  5 mg Intravenous Q6H PRN Hollice Espy, MD   5 mg at 01/22/23 0842   melatonin tablet 5 mg  5 mg Oral QHS PRN Dow Adolph N, DO   5 mg at 01/19/23 2139   memantine (NAMENDA) tablet 10 mg  10 mg Oral Daily Hollice Espy, MD   10 mg at 01/21/23 1057   multivitamin with minerals tablet 1 tablet  1 tablet Oral Daily Hollice Espy, MD   1 tablet at 01/21/23 1057   polyethylene glycol (MIRALAX / GLYCOLAX) packet 17 g  17 g Oral Daily PRN Dow Adolph N, DO       QUEtiapine  (SEROQUEL) tablet 50 mg  50 mg Oral QHS Hollice Espy, MD         Discharge Medications: Please see discharge summary for a list of discharge medications.  Relevant Imaging Results:  Relevant Lab Results:   Additional Information SS# [id number]  Beckie Busing, RN

## 2023-01-22 NOTE — Progress Notes (Signed)
PT Cancellation Note  Patient Details Name: RONDALD JANOFF MRN: [id number] DOB: 06/30/1941   Cancelled Treatment:     attempted to in afternoon.  Pt sleeping.  Unable to arouse.  Will attempt to see tomorrow am (mornings are better) OOB activity.  Felecia Shelling  PTA Acute  Rehabilitation Services Office M-F          [phone]
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
## 2023-01-22 NOTE — TOC Progression Note (Signed)
Transition of Care [facility]) - Progression Note    Patient Details  Name: Chase Mccarthy MRN: [id number] Date of Birth: 1941-08-11  Transition of Care [facility]) CM/SW Contact  Beckie Busing, RN Phone Number:[phone]  01/22/2023, 2:14 PM  Clinical Narrative:    Cherlyn Roberts pending, FL2 completed         Expected Discharge Plan and Services                                               Social Determinants of Health (SDOH) Interventions SDOH Screenings   Food Insecurity: No Food Insecurity (01/20/2023)  Housing: Low Risk  (01/20/2023)  Transportation Needs: No Transportation Needs (01/20/2023)  Utilities: Not At Risk (01/20/2023)  Tobacco Use: High Risk (01/18/2023)    Readmission Risk Interventions     No data to display
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
## 2023-01-22 NOTE — Progress Notes (Signed)
Attempted to reach daughter Revonda Standard at ph# [phone] provided. Reached voicemail and it was too full to leave message.

## 2023-01-22 NOTE — Progress Notes (Signed)
Regarding: Chase Mccarthy Date of Birth: Oct 17, 1940 Date: 01/22/2023   To Whom It May Concern:   Please be advised that the above-named patient will require a short-term Nursing home stat - anticipated 30 days or less for rehabilitation and strengthening. The plan is for return home.

## 2023-01-23 DIAGNOSIS — R627 Adult failure to thrive: Secondary | ICD-10-CM | POA: Diagnosis not present

## 2023-01-23 DIAGNOSIS — N179 Acute kidney failure, unspecified: Secondary | ICD-10-CM | POA: Diagnosis not present

## 2023-01-23 DIAGNOSIS — E43 Unspecified severe protein-calorie malnutrition: Secondary | ICD-10-CM | POA: Diagnosis not present

## 2023-01-23 DIAGNOSIS — F03B0 Unspecified dementia, moderate, without behavioral disturbance, psychotic disturbance, mood disturbance, and anxiety: Secondary | ICD-10-CM | POA: Diagnosis not present

## 2023-01-23 LAB — BASIC METABOLIC PANEL
Anion gap: 5 (ref 5–15)
BUN: 18 mg/dL (ref 8–23)
CO2: 26 mmol/L (ref 22–32)
Calcium: 8.4 mg/dL — ABNORMAL LOW (ref 8.9–10.3)
Chloride: 110 mmol/L (ref 98–111)
Creatinine, Ser: 1.31 mg/dL — ABNORMAL HIGH (ref 0.61–1.24)
GFR, Estimated: 54 mL/min — ABNORMAL LOW (ref >60)
Glucose, Bld: 125 mg/dL — ABNORMAL HIGH (ref 70–99)
Potassium: 3.6 mmol/L (ref 3.5–5.1)
Sodium: 141 mmol/L (ref 135–145)

## 2023-01-23 LAB — GLUCOSE, CAPILLARY: Glucose-Capillary: 94 mg/dL (ref 70–99)

## 2023-01-23 MED ORDER — DIPHENHYDRAMINE HCL 50 MG/ML IJ SOLN
INTRAMUSCULAR | Status: AC
  Administered 2023-01-23: 50 mg via INTRAVENOUS
  Filled 2023-01-23: qty 1

## 2023-01-23 MED ORDER — DIPHENHYDRAMINE HCL 50 MG/ML IJ SOLN: 50.0000 mg | Freq: Once | INTRAMUSCULAR | Status: AC

## 2023-01-23 NOTE — Progress Notes (Signed)
       CROSS COVER NOTE  NAME: Chase Mccarthy MRN: [id number] DOB : [birth year] ATTENDING PHYSICIAN: Hollice Espy, MD    Date of Service   01/23/2023   HPI/Events of Note   Report/Request  "this patient is very confused and agitated. he will NOT stay in bed and he's very very unsteady. I've given scheduled seroquel, PRN melatonin, and PRN haldol. nothing is working"  On Review of chart *** Bedside eval*** HPI***  Interventions   Assessment/Plan: IV Benadryl *** low d X    *** professional thanks      To reach the provider On-Call:   7AM- 7PM see care teams to locate the attending and reach out to them via [email or web address] Password: [email or web address] 7PM-7AM contact night-coverage If you still have difficulty reaching the appropriate provider, please page the [facility] (Director on Call) for [facility] on amion for assistance  This document was prepared using Conservation officer, historic buildings and may include unintentional dictation errors.  Bishop Limbo DNP, MBA, FNP-BC, PMHNP-BC Nurse Practitioner [facility] Pager [phone]

## 2023-01-23 NOTE — Progress Notes (Signed)
Occupational Therapy Treatment Patient Details Name: Chase Mccarthy MRN: [id number] DOB: December 04, 1940 Today's Date: 01/23/2023   History of present illness Chase Mccarthy is a 82 y.o. male sent from PCPs office due to hypotension and concern for dehydration, generalized weakness, and failure to thrive, admitted with AKI. PMH: advanced dementia, hypertension, hyperlipidemia, type 2 diabetes   OT comments  Pt making progress with functional goals. Pt very pleasant and cooperative. Pt used RW min to stand from recliner to RW to walk to bathroom min guard for balance/safety to transfer to toilet for toileting tasks.  OT to continue to follow acutely to maximize level of function   Recommendations for follow up therapy are one component of a multi-disciplinary discharge planning process, led by the attending physician.  Recommendations may be updated based on patient status, additional functional criteria and insurance authorization.    Assistance Recommended at Discharge Frequent or constant Supervision/Assistance  Patient can return home with the following  A little help with walking and/or transfers;A little help with bathing/dressing/bathroom;Assistance with cooking/housework;Direct supervision/assist for medications management;Assist for transportation;Help with stairs or ramp for entrance;Direct supervision/assist for financial management   Equipment Recommendations  None recommended by OT    Recommendations for Other Services      Precautions / Restrictions Precautions Precautions: Fall Precaution Comments: AMS Restrictions Weight Bearing Restrictions: No       Mobility Bed Mobility               General bed mobility comments: pt in recliner upon arrival    Transfers Overall transfer level: Needs assistance Equipment used: Rolling walker (2 wheels), None Transfers: Sit to/from Stand Sit to Stand: Min assist                 Balance Overall balance assessment: Needs  assistance Sitting-balance support: Feet supported Sitting balance-Leahy Scale: Good     Standing balance support: During functional activity, Single extremity supported Standing balance-Leahy Scale: Fair                             ADL either performed or assessed with clinical judgement   ADL Overall ADL's : Needs assistance/impaired     Grooming: Wash/dry hands;Wash/dry face;Min guard;Standing               Lower Body Dressing: Minimal assistance;Sitting/lateral leans   Toilet Transfer: Minimal assistance;Ambulation;Grab bars;Cueing for safety   Toileting- Clothing Manipulation and Hygiene: Min guard;Cueing for safety       Functional mobility during ADLs: Minimal assistance;Cueing for safety      Extremity/Trunk Assessment Upper Extremity Assessment Upper Extremity Assessment: Generalized weakness   Lower Extremity Assessment Lower Extremity Assessment: Defer to PT evaluation   Cervical / Trunk Assessment Cervical / Trunk Assessment: Kyphotic    Vision Baseline Vision/History: 1 Wears glasses Ability to See in Adequate Light: 0 Adequate Patient Visual Report: No change from baseline     Perception     Praxis      Cognition Arousal/Alertness: Awake/alert Behavior During Therapy: WFL for tasks assessed/performed Overall Cognitive Status: No family/caregiver present to determine baseline cognitive functioning                                          Exercises      Shoulder Instructions       General Comments  Pertinent Vitals/ Pain       Pain Assessment Pain Assessment: No/denies pain Pain Score: 0-No pain  Home Living                                          Prior Functioning/Environment              Frequency  Min 2X/week        Progress Toward Goals  OT Goals(current goals can now be found in the care plan section)  Progress towards OT goals: Progressing toward goals      Plan Discharge plan remains appropriate    Co-evaluation                 AM-PAC OT "6 Clicks" Daily Activity     Outcome Measure   Help from another person eating meals?: None Help from another person taking care of personal grooming?: A Little Help from another person toileting, which includes using toliet, bedpan, or urinal?: A Little Help from another person bathing (including washing, rinsing, drying)?: A Little Help from another person to put on and taking off regular upper body clothing?: A Little Help from another person to put on and taking off regular lower body clothing?: A Lot 6 Click Score: 18    End of Session Equipment Utilized During Treatment: Gait belt;Rolling walker (2 wheels)  OT Visit Diagnosis: Unsteadiness on feet (R26.81);Other abnormalities of gait and mobility (R26.89);Muscle weakness (generalized) (M62.81)   Activity Tolerance Patient tolerated treatment well   Patient Left with call bell/phone within reach;in chair;with chair alarm set   Nurse Communication          Time: 0981-1914 OT Time Calculation (min): 19 min  Charges: OT General Charges $OT Visit: 1 Visit OT Treatments $Therapeutic Activity: 8-22 mins    Galen Manila 01/23/2023, 1:10 PM
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
## 2023-01-23 NOTE — TOC Progression Note (Addendum)
Transition of Care [facility]) - Progression Note    Patient Details  Name: Chase Mccarthy MRN: [id number] Date of Birth: 08/02/1941  [facility] CM/SW Contact  Beckie Busing, RN Phone [phone]  01/23/2023, 11:15 AM  Clinical Narrative:    PASRR # [id number], Insurance auth initiated [place] ID # [id number].   1148 CM attempted to call spouse to review bed offers. There is no answer and  no voicemail option . Will attempt to call later.   1241 Cm spoke with Spouse Helyn Numbers. CM made spouse aware of bed offers. Spouses choice is [facility]. CM has reached out to Dennis with admissions at [facility]. Olegario Messier confirms that facility can extend a bed offer. Insurance Berkley Harvey has been updated with facility choice.       Expected Discharge Plan and Services                                               Social Determinants of Health (SDOH) Interventions SDOH Screenings   Food Insecurity: No Food Insecurity (01/20/2023)  Housing: Low Risk  (01/20/2023)  Transportation Needs: No Transportation Needs (01/20/2023)  Utilities: Not At Risk (01/20/2023)  Tobacco Use: High Risk (01/18/2023)    Readmission Risk Interventions     No data to display

## 2023-01-23 NOTE — Progress Notes (Signed)
Physical Therapy Treatment Patient Details Name: Chase Mccarthy MRN: [id number] DOB: 1941-03-30 Today's Date: 01/23/2023   History of Present Illness CIN STUTZMAN is a 82 y.o. male sent from PCPs office due to hypotension and concern for dehydration, generalized weakness, and failure to thrive, admitted with AKI. PMH: advanced dementia, hypertension, hyperlipidemia, type 2 diabetes    PT Comments    General Comments: improved cognition from admit.  AxO 3 knows he is in the hospital "about a week" stated pt.  Pleasant.  Following all commands.  Pt stated he lives home alone and has a Son who checks on him as well as his second wife.  Stated he does NOT use any AD in home.  Suspect "furniture" walker.  Retired [employer]. Pt in bed eating breakfast. Food everywhere.  Assisted OOB to amb to bathroom then in hallway.  General transfer comment: min A to steady with powering up to stand.  Unsteady with poor forward flexed posture and narrow BOS.  Impulsive to stand then slow to initiate.  Also assisted with a toilet transfer, standing at toilet to void.  Present with a slight sway and again a narrow BOS.  Poor self correction.  HIGH FALL RISK. General Gait Details: pt tolerated amb a limited distance in hallway with Therapist HHA as pt did not use any AD prior to admit.  Pt presents with a VERY shuffled, short step gait and narrow BOS.  Posture is poor forward flexed.  Pt difts right/left.  Also rigidity with LOB during right head turn to look up at clock.  Therapist recovered.  Poor self correction.  HIGH FALL RISK.  Suspect "Furniture Walker" at home. Pt lives home alone.  Pt will need ST Rehab at SNF to address mobility and functional decline prior to safely returning home.    Recommendations for follow up therapy are one component of a multi-disciplinary discharge planning process, led by the attending physician.  Recommendations may be updated based on patient  status, additional functional criteria and insurance authorization.  Follow Up Recommendations  Can patient physically be transported by private vehicle: No    Assistance Recommended at Discharge Frequent or constant Supervision/Assistance  Patient can return home with the following A little help with walking and/or transfers;A little help with bathing/dressing/bathroom;Assistance with cooking/housework;Assist for transportation;Help with stairs or ramp for entrance   Equipment Recommendations  None recommended by PT    Recommendations for Other Services       Precautions / Restrictions Precautions Precautions: Fall Precaution Comments: AMS Restrictions Weight Bearing Restrictions: No     Mobility  Bed Mobility Overal bed mobility: Needs Assistance Bed Mobility: Supine to Sit     Supine to sit: Supervision, Min guard     General bed mobility comments: VC's to stay on task and increased time to complete scooting to EOB due to weakness.    Transfers Overall transfer level: Needs assistance Equipment used: None Transfers: Sit to/from Stand Sit to Stand: Min assist           General transfer comment: min A to steady with powering up to stand.  Unsteady with poor forward flexed posture and narrow BOS.  Impulsive to stand then slow to initiate.  Also assisted with a toilet transfer, standing at toilet to void.  Present with a slight sway and again a narrow BOS.  Poor self correction.  HIGH FALL RISK.    Ambulation/Gait Ambulation/Gait assistance: Min assist, Mod assist Gait Distance (  Feet): 24 Feet Assistive device: None Gait Pattern/deviations: Step-to pattern, Decreased step length - right, Decreased step length - left, Shuffle, Drifts right/left, Trunk flexed, Narrow base of support Gait velocity: decreased     General Gait Details: pt tolerated amb a limited distance in hallway with Therapist HHA as pt did not use any AD prior to admit.  Pt presents with a VERY  shuffled, short step gait and narrow BOS.  Posture is poor forward flexed.  Pt difts right/left.  Also rigidity with LOB during right head turn to look up at clock.  Therapist recovered.  Poor self correction.  HIGH FALL RISK.  Suspect "Furniture Walker" at home.   Stairs             Wheelchair Mobility    Modified Rankin (Stroke Patients Only)       Balance                                            Cognition Arousal/Alertness: Awake/alert Behavior During Therapy: WFL for tasks assessed/performed Overall Cognitive Status: No family/caregiver present to determine baseline cognitive functioning                                 General Comments: improved cognition from admit.  AxO 3 knows he is in the hospital "about a week" stated pt.  Pleasant.  Following all commands.  Pt stated he lives home alone and has a Son who checks on him as well as his second wife.  Stated he does NOT use any AD in home.  Suspect "furniture" walker.  Retired [employer].        Exercises      General Comments        Pertinent Vitals/Pain Pain Assessment Pain Assessment: No/denies pain    Home Living                          Prior Function            PT Goals (current goals can now be found in the care plan section) Progress towards PT goals: Progressing toward goals    Frequency    Min 1X/week      PT Plan Current plan remains appropriate    Co-evaluation              AM-PAC PT "6 Clicks" Mobility   Outcome Measure  Help needed turning from your back to your side while in a flat bed without using bedrails?: A Little Help needed moving from lying on your back to sitting on the side of a flat bed without using bedrails?: A Little Help needed moving to and from a bed to a chair (including a wheelchair)?: A Little Help needed standing up from a chair using your arms (e.g., wheelchair or  bedside chair)?: A Lot Help needed to walk in hospital room?: A Lot Help needed climbing 3-5 steps with a railing? : Total 6 Click Score: 14    End of Session Equipment Utilized During Treatment: Gait belt Activity Tolerance: Patient tolerated treatment well Patient left: in chair;with call bell/phone within reach;with chair alarm set Nurse Communication: Mobility status PT Visit Diagnosis: Unsteadiness on feet (R26.81);Other abnormalities of gait and mobility (R26.89);Muscle weakness (generalized) (M62.81)  Time: 1610-9604 PT Time Calculation (min) (ACUTE ONLY): 25 min  Charges:  $Gait Training: 8-22 mins $Therapeutic Activity: 8-22 mins                       Felecia Shelling  PTA Acute  Rehabilitation Services Office M-F          [phone]

## 2023-01-24 DIAGNOSIS — N179 Acute kidney failure, unspecified: Secondary | ICD-10-CM | POA: Diagnosis not present

## 2023-01-24 LAB — CBC
HCT: 36.5 % — ABNORMAL LOW (ref 39.0–52.0)
Hemoglobin: 12.1 g/dL — ABNORMAL LOW (ref 13.0–17.0)
MCH: 30.1 pg (ref 26.0–34.0)
MCHC: 33.2 g/dL (ref 30.0–36.0)
MCV: 90.8 fL (ref 80.0–100.0)
Platelets: 144 10*3/uL — ABNORMAL LOW (ref 150–400)
RBC: 4.02 MIL/uL — ABNORMAL LOW (ref 4.22–5.81)
RDW: 13 % (ref 11.5–15.5)
WBC: 3.6 10*3/uL — ABNORMAL LOW (ref 4.0–10.5)
nRBC: 0 % (ref 0.0–0.2)

## 2023-01-24 LAB — BASIC METABOLIC PANEL
Anion gap: 6 (ref 5–15)
BUN: 19 mg/dL (ref 8–23)
CO2: 26 mmol/L (ref 22–32)
Calcium: 8.9 mg/dL (ref 8.9–10.3)
Chloride: 107 mmol/L (ref 98–111)
Creatinine, Ser: 1.23 mg/dL (ref 0.61–1.24)
GFR, Estimated: 58 mL/min — ABNORMAL LOW (ref >60)
Glucose, Bld: 95 mg/dL (ref 70–99)
Potassium: 3.5 mmol/L (ref 3.5–5.1)
Sodium: 139 mmol/L (ref 135–145)

## 2023-01-24 MED ORDER — QUETIAPINE FUMARATE 50 MG PO TABS: 50.0000 mg | ORAL_TABLET | Freq: Every day | ORAL | Status: DC

## 2023-01-24 MED ORDER — POLYETHYLENE GLYCOL 3350 17 G PO PACK: 17.0000 g | PACK | Freq: Every day | ORAL | 0 refills | Status: DC | PRN

## 2023-01-24 MED ORDER — ENSURE ENLIVE PO LIQD: 237.0000 mL | Freq: Two times a day (BID) | ORAL | 12 refills | Status: AC

## 2023-01-24 MED ORDER — AMLODIPINE BESYLATE 5 MG PO TABS: 5.0000 mg | ORAL_TABLET | Freq: Every day | ORAL | Status: DC

## 2023-01-24 MED ORDER — LORAZEPAM 2 MG/ML IJ SOLN
0.5000 mg | Freq: Once | INTRAMUSCULAR | Status: AC
  Administered 2023-01-24: 0.5 mg via INTRAVENOUS
  Filled 2023-01-24: qty 1

## 2023-01-24 MED ORDER — MELATONIN 5 MG PO TABS: 5.0000 mg | ORAL_TABLET | Freq: Every evening | ORAL | 0 refills | Status: DC | PRN

## 2023-01-24 MED ORDER — ASPIRIN 81 MG PO TBEC: 81.0000 mg | DELAYED_RELEASE_TABLET | Freq: Every day | ORAL | 12 refills | Status: AC

## 2023-01-24 NOTE — TOC Progression Note (Signed)
Transition of Care [facility]) - Progression Note    Patient Details  Name: Chase Mccarthy MRN: [id number] Date of Birth: 26-Feb-1941  Transition [facility] Care [facility]) CM/SW Contact  Beckie Busing, RN Phone [phone]  01/24/2023, 9:57 AM  Clinical Narrative:    Insurance auth received Plan auth ID# [id number] Auth ID # [id number]. Per Kia at [facility] the facility can accept patient today. MD has been made aware .         Expected Discharge Plan and Services                                               Social Determinants of Health (SDOH) Interventions SDOH Screenings   Food Insecurity: No Food Insecurity (01/20/2023)  Housing: Low Risk  (01/20/2023)  Transportation Needs: No Transportation Needs (01/20/2023)  Utilities: Not At Risk (01/20/2023)  Tobacco Use: High Risk (01/18/2023)    Readmission Risk Interventions     No data to display

## 2023-01-24 NOTE — Plan of Care (Signed)
  Problem: Education: Goal: Knowledge of General Education information will improve Description: Including pain rating scale, medication(s)/side effects and non-pharmacologic comfort measures Outcome: Completed/Met   Problem: Health Behavior/Discharge Planning: Goal: Ability to manage health-related needs will improve Outcome: Completed/Met   Problem: Clinical Measurements: Goal: Ability to maintain clinical measurements within normal limits will improve Outcome: Completed/Met Goal: Will remain free from infection Outcome: Completed/Met Goal: Diagnostic test results will improve Outcome: Completed/Met Goal: Respiratory complications will improve Outcome: Completed/Met Goal: Cardiovascular complication will be avoided Outcome: Completed/Met   Problem: Activity: Goal: Risk for activity intolerance will decrease Outcome: Completed/Met   Problem: Nutrition: Goal: Adequate nutrition will be maintained Outcome: Completed/Met   Problem: Coping: Goal: Level of anxiety will decrease Outcome: Completed/Met   Problem: Elimination: Goal: Will not experience complications related to bowel motility Outcome: Completed/Met Goal: Will not experience complications related to urinary retention Outcome: Completed/Met   Problem: Pain Managment: Goal: General experience of comfort will improve Outcome: Completed/Met   Problem: Safety: Goal: Ability to remain free from injury will improve Outcome: Completed/Met   Problem: Skin Integrity: Goal: Risk for impaired skin integrity will decrease Outcome: Completed/Met   Problem: Safety: Goal: Non-violent Restraint(s) Outcome: Completed/Met   

## 2023-01-24 NOTE — Progress Notes (Signed)
Patient agitated and smacking at staff. Continues to try to climb over bed rails. States he needs to leave his ride to [place] is waiting. Provider notified
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
## 2023-01-24 NOTE — TOC Transition Note (Signed)
Transition of Care [facility]) - CM/SW Discharge Note   Patient Details  Name: Chase Mccarthy MRN: [id number] Date of Birth: 07-07-1941  [facility] CM/SW Contact:  Beckie Busing, RN Phone [phone]  01/24/2023, 11:17 AM   Clinical Narrative:    Patient discharging to [facility] healthcare. Discharge summary has been faxed to facility. Transportation has been arranged per [facility]. Spouse Kyron Demint has been updated. Discharge packet is at nurses station.  Please call report to [facility] [phone] Room #124B    Final next level of care: Skilled Nursing Facility Barriers to Discharge: No Barriers Identified   Patient Goals and CMS Choice      Discharge Placement                Patient chooses bed at: [facility] Patient to be transferred to facility by: [facility] Name of family member notified: Beaux Rahlf spouse Patient and family notified of of transfer: 01/24/23  Discharge Plan and Services Additional resources added to the After Visit Summary for                  DME Arranged: N/A DME Agency: NA         HH Agency: NA        Social Determinants of Health (SDOH) Interventions SDOH Screenings   Food Insecurity: No Food Insecurity (01/20/2023)  Housing: Low Risk  (01/20/2023)  Transportation Needs: No Transportation Needs (01/20/2023)  Utilities: Not At Risk (01/20/2023)  Tobacco Use: High Risk (01/18/2023)     Readmission Risk Interventions     No data to display

## 2023-01-24 NOTE — Progress Notes (Addendum)
RN called report to [facility] to Nurse Danella Deis, discharge packet at nurses station, patient is dressed, PIV removed, and education completed. Patient awaiting [facility] transportation.  1250: [facility] Arrived and transporting him to [facility] now.

## 2023-02-13 ENCOUNTER — Ambulatory Visit: Payer: Medicare PPO | Admitting: Diagnostic Neuroimaging

## 2023-09-13 DIAGNOSIS — I6990 Unspecified sequelae of unspecified cerebrovascular disease: Secondary | ICD-10-CM | POA: Diagnosis not present

## 2023-09-13 DIAGNOSIS — F411 Generalized anxiety disorder: Secondary | ICD-10-CM | POA: Diagnosis not present

## 2023-09-13 DIAGNOSIS — F5105 Insomnia due to other mental disorder: Secondary | ICD-10-CM | POA: Diagnosis not present

## 2023-09-13 DIAGNOSIS — F0150 Vascular dementia without behavioral disturbance: Secondary | ICD-10-CM | POA: Diagnosis not present

## 2023-09-13 DIAGNOSIS — F339 Major depressive disorder, recurrent, unspecified: Secondary | ICD-10-CM | POA: Diagnosis not present

## 2023-09-20 DIAGNOSIS — R0902 Hypoxemia: Secondary | ICD-10-CM | POA: Diagnosis not present

## 2023-09-20 DIAGNOSIS — F03918 Unspecified dementia, unspecified severity, with other behavioral disturbance: Secondary | ICD-10-CM | POA: Diagnosis not present

## 2023-10-09 DIAGNOSIS — F03918 Unspecified dementia, unspecified severity, with other behavioral disturbance: Secondary | ICD-10-CM | POA: Diagnosis not present

## 2023-10-09 DIAGNOSIS — I959 Hypotension, unspecified: Secondary | ICD-10-CM | POA: Diagnosis not present

## 2023-10-09 DIAGNOSIS — M6281 Muscle weakness (generalized): Secondary | ICD-10-CM | POA: Diagnosis not present

## 2023-10-11 DIAGNOSIS — F339 Major depressive disorder, recurrent, unspecified: Secondary | ICD-10-CM | POA: Diagnosis not present

## 2023-10-11 DIAGNOSIS — F411 Generalized anxiety disorder: Secondary | ICD-10-CM | POA: Diagnosis not present

## 2023-10-11 DIAGNOSIS — F5105 Insomnia due to other mental disorder: Secondary | ICD-10-CM | POA: Diagnosis not present

## 2023-10-11 DIAGNOSIS — Z79899 Other long term (current) drug therapy: Secondary | ICD-10-CM | POA: Diagnosis not present

## 2023-10-11 DIAGNOSIS — Z8673 Personal history of transient ischemic attack (TIA), and cerebral infarction without residual deficits: Secondary | ICD-10-CM | POA: Diagnosis not present

## 2023-10-11 DIAGNOSIS — F0150 Vascular dementia without behavioral disturbance: Secondary | ICD-10-CM | POA: Diagnosis not present

## 2023-10-16 DIAGNOSIS — M6281 Muscle weakness (generalized): Secondary | ICD-10-CM | POA: Diagnosis not present

## 2023-10-16 DIAGNOSIS — F03918 Unspecified dementia, unspecified severity, with other behavioral disturbance: Secondary | ICD-10-CM | POA: Diagnosis not present

## 2023-10-16 DIAGNOSIS — I959 Hypotension, unspecified: Secondary | ICD-10-CM | POA: Diagnosis not present

## 2023-10-23 DIAGNOSIS — I959 Hypotension, unspecified: Secondary | ICD-10-CM | POA: Diagnosis not present

## 2023-10-23 DIAGNOSIS — M6281 Muscle weakness (generalized): Secondary | ICD-10-CM | POA: Diagnosis not present

## 2023-10-23 DIAGNOSIS — F03918 Unspecified dementia, unspecified severity, with other behavioral disturbance: Secondary | ICD-10-CM | POA: Diagnosis not present

## 2023-11-02 DIAGNOSIS — N401 Enlarged prostate with lower urinary tract symptoms: Secondary | ICD-10-CM | POA: Diagnosis not present

## 2023-11-02 DIAGNOSIS — M5032 Other cervical disc degeneration, mid-cervical region, unspecified level: Secondary | ICD-10-CM | POA: Diagnosis not present

## 2023-11-02 DIAGNOSIS — J449 Chronic obstructive pulmonary disease, unspecified: Secondary | ICD-10-CM | POA: Diagnosis not present

## 2023-11-02 DIAGNOSIS — E1151 Type 2 diabetes mellitus with diabetic peripheral angiopathy without gangrene: Secondary | ICD-10-CM | POA: Diagnosis not present

## 2023-11-02 DIAGNOSIS — N1831 Chronic kidney disease, stage 3a: Secondary | ICD-10-CM | POA: Diagnosis not present

## 2023-11-02 DIAGNOSIS — R2689 Other abnormalities of gait and mobility: Secondary | ICD-10-CM | POA: Diagnosis not present

## 2023-11-02 DIAGNOSIS — G309 Alzheimer's disease, unspecified: Secondary | ICD-10-CM | POA: Diagnosis not present

## 2023-11-02 DIAGNOSIS — I119 Hypertensive heart disease without heart failure: Secondary | ICD-10-CM | POA: Diagnosis not present

## 2023-11-06 DIAGNOSIS — R638 Other symptoms and signs concerning food and fluid intake: Secondary | ICD-10-CM | POA: Diagnosis not present

## 2023-11-06 DIAGNOSIS — M6281 Muscle weakness (generalized): Secondary | ICD-10-CM | POA: Diagnosis not present

## 2023-11-06 DIAGNOSIS — F03918 Unspecified dementia, unspecified severity, with other behavioral disturbance: Secondary | ICD-10-CM | POA: Diagnosis not present

## 2023-11-06 DIAGNOSIS — R634 Abnormal weight loss: Secondary | ICD-10-CM | POA: Diagnosis not present

## 2023-11-08 DIAGNOSIS — Z8673 Personal history of transient ischemic attack (TIA), and cerebral infarction without residual deficits: Secondary | ICD-10-CM | POA: Diagnosis not present

## 2023-11-08 DIAGNOSIS — F0150 Vascular dementia without behavioral disturbance: Secondary | ICD-10-CM | POA: Diagnosis not present

## 2023-11-08 DIAGNOSIS — F5105 Insomnia due to other mental disorder: Secondary | ICD-10-CM | POA: Diagnosis not present

## 2023-11-08 DIAGNOSIS — Z79899 Other long term (current) drug therapy: Secondary | ICD-10-CM | POA: Diagnosis not present

## 2023-11-08 DIAGNOSIS — F411 Generalized anxiety disorder: Secondary | ICD-10-CM | POA: Diagnosis not present

## 2023-11-08 DIAGNOSIS — F331 Major depressive disorder, recurrent, moderate: Secondary | ICD-10-CM | POA: Diagnosis not present

## 2023-11-13 DIAGNOSIS — M6281 Muscle weakness (generalized): Secondary | ICD-10-CM | POA: Diagnosis not present

## 2023-11-13 DIAGNOSIS — F03918 Unspecified dementia, unspecified severity, with other behavioral disturbance: Secondary | ICD-10-CM | POA: Diagnosis not present

## 2023-11-13 DIAGNOSIS — R638 Other symptoms and signs concerning food and fluid intake: Secondary | ICD-10-CM | POA: Diagnosis not present

## 2023-11-15 DIAGNOSIS — R4182 Altered mental status, unspecified: Secondary | ICD-10-CM | POA: Diagnosis not present

## 2023-11-15 DIAGNOSIS — F03918 Unspecified dementia, unspecified severity, with other behavioral disturbance: Secondary | ICD-10-CM | POA: Diagnosis not present

## 2023-11-15 DIAGNOSIS — R451 Restlessness and agitation: Secondary | ICD-10-CM | POA: Diagnosis not present

## 2023-11-15 DIAGNOSIS — M6281 Muscle weakness (generalized): Secondary | ICD-10-CM | POA: Diagnosis not present

## 2023-11-16 DIAGNOSIS — N390 Urinary tract infection, site not specified: Secondary | ICD-10-CM | POA: Diagnosis not present

## 2023-11-19 DIAGNOSIS — N1832 Chronic kidney disease, stage 3b: Secondary | ICD-10-CM | POA: Diagnosis not present

## 2023-11-19 DIAGNOSIS — F03918 Unspecified dementia, unspecified severity, with other behavioral disturbance: Secondary | ICD-10-CM | POA: Diagnosis not present

## 2023-11-19 DIAGNOSIS — M6281 Muscle weakness (generalized): Secondary | ICD-10-CM | POA: Diagnosis not present

## 2023-11-19 DIAGNOSIS — D649 Anemia, unspecified: Secondary | ICD-10-CM | POA: Diagnosis not present

## 2023-11-29 ENCOUNTER — Encounter (HOSPITAL_COMMUNITY): Payer: Self-pay

## 2023-11-29 ENCOUNTER — Emergency Department (HOSPITAL_COMMUNITY)

## 2023-11-29 ENCOUNTER — Observation Stay (HOSPITAL_COMMUNITY)
Admission: EM | Admit: 2023-11-29 | Discharge: 2023-12-06 | Disposition: A | Discharge status: Skilled Nursing Facility | Attending: Family Medicine | Admitting: Family Medicine

## 2023-11-29 DIAGNOSIS — R402421 Glasgow coma scale score 9-12, in the field [EMT or ambulance]: Secondary | ICD-10-CM | POA: Diagnosis not present

## 2023-11-29 DIAGNOSIS — E785 Hyperlipidemia, unspecified: Secondary | ICD-10-CM | POA: Diagnosis not present

## 2023-11-29 DIAGNOSIS — E43 Unspecified severe protein-calorie malnutrition: Secondary | ICD-10-CM | POA: Insufficient documentation

## 2023-11-29 DIAGNOSIS — G9340 Encephalopathy, unspecified: Principal | ICD-10-CM | POA: Diagnosis present

## 2023-11-29 DIAGNOSIS — G319 Degenerative disease of nervous system, unspecified: Secondary | ICD-10-CM | POA: Diagnosis not present

## 2023-11-29 DIAGNOSIS — R1310 Dysphagia, unspecified: Secondary | ICD-10-CM | POA: Diagnosis not present

## 2023-11-29 DIAGNOSIS — J984 Other disorders of lung: Secondary | ICD-10-CM | POA: Diagnosis not present

## 2023-11-29 DIAGNOSIS — B351 Tinea unguium: Secondary | ICD-10-CM | POA: Diagnosis not present

## 2023-11-29 DIAGNOSIS — R4182 Altered mental status, unspecified: Secondary | ICD-10-CM | POA: Diagnosis present

## 2023-11-29 DIAGNOSIS — N183 Chronic kidney disease, stage 3 unspecified: Secondary | ICD-10-CM | POA: Insufficient documentation

## 2023-11-29 DIAGNOSIS — R29818 Other symptoms and signs involving the nervous system: Secondary | ICD-10-CM | POA: Diagnosis not present

## 2023-11-29 DIAGNOSIS — F0390 Unspecified dementia without behavioral disturbance: Secondary | ICD-10-CM | POA: Insufficient documentation

## 2023-11-29 DIAGNOSIS — Z9181 History of falling: Secondary | ICD-10-CM | POA: Insufficient documentation

## 2023-11-29 DIAGNOSIS — E119 Type 2 diabetes mellitus without complications: Secondary | ICD-10-CM

## 2023-11-29 DIAGNOSIS — Z7982 Long term (current) use of aspirin: Secondary | ICD-10-CM | POA: Diagnosis not present

## 2023-11-29 DIAGNOSIS — I10 Essential (primary) hypertension: Secondary | ICD-10-CM | POA: Diagnosis not present

## 2023-11-29 DIAGNOSIS — I959 Hypotension, unspecified: Secondary | ICD-10-CM | POA: Diagnosis not present

## 2023-11-29 DIAGNOSIS — R404 Transient alteration of awareness: Secondary | ICD-10-CM | POA: Diagnosis not present

## 2023-11-29 DIAGNOSIS — F17290 Nicotine dependence, other tobacco product, uncomplicated: Secondary | ICD-10-CM | POA: Insufficient documentation

## 2023-11-29 DIAGNOSIS — W19XXXA Unspecified fall, initial encounter: Secondary | ICD-10-CM | POA: Diagnosis not present

## 2023-11-29 DIAGNOSIS — N179 Acute kidney failure, unspecified: Secondary | ICD-10-CM | POA: Diagnosis not present

## 2023-11-29 DIAGNOSIS — E1122 Type 2 diabetes mellitus with diabetic chronic kidney disease: Secondary | ICD-10-CM | POA: Insufficient documentation

## 2023-11-29 DIAGNOSIS — N138 Other obstructive and reflux uropathy: Secondary | ICD-10-CM | POA: Diagnosis present

## 2023-11-29 DIAGNOSIS — N401 Enlarged prostate with lower urinary tract symptoms: Secondary | ICD-10-CM | POA: Insufficient documentation

## 2023-11-29 DIAGNOSIS — R739 Hyperglycemia, unspecified: Secondary | ICD-10-CM

## 2023-11-29 DIAGNOSIS — Z79899 Other long term (current) drug therapy: Secondary | ICD-10-CM | POA: Diagnosis not present

## 2023-11-29 DIAGNOSIS — T07XXXA Unspecified multiple injuries, initial encounter: Secondary | ICD-10-CM | POA: Diagnosis not present

## 2023-11-29 DIAGNOSIS — I700 Atherosclerosis of aorta: Secondary | ICD-10-CM | POA: Diagnosis not present

## 2023-11-29 DIAGNOSIS — F03918 Unspecified dementia, unspecified severity, with other behavioral disturbance: Secondary | ICD-10-CM | POA: Diagnosis not present

## 2023-11-29 DIAGNOSIS — G9389 Other specified disorders of brain: Secondary | ICD-10-CM | POA: Diagnosis not present

## 2023-11-29 DIAGNOSIS — E559 Vitamin D deficiency, unspecified: Secondary | ICD-10-CM | POA: Insufficient documentation

## 2023-11-29 DIAGNOSIS — I129 Hypertensive chronic kidney disease with stage 1 through stage 4 chronic kidney disease, or unspecified chronic kidney disease: Secondary | ICD-10-CM | POA: Diagnosis not present

## 2023-11-29 DIAGNOSIS — Z043 Encounter for examination and observation following other accident: Secondary | ICD-10-CM | POA: Diagnosis not present

## 2023-11-29 DIAGNOSIS — I6523 Occlusion and stenosis of bilateral carotid arteries: Secondary | ICD-10-CM | POA: Diagnosis not present

## 2023-11-29 DIAGNOSIS — N289 Disorder of kidney and ureter, unspecified: Secondary | ICD-10-CM | POA: Insufficient documentation

## 2023-11-29 DIAGNOSIS — M6281 Muscle weakness (generalized): Secondary | ICD-10-CM | POA: Diagnosis not present

## 2023-11-29 LAB — COMPREHENSIVE METABOLIC PANEL
ALT: 11 U/L (ref 0–44)
AST: 17 U/L (ref 15–41)
Albumin: 4.2 g/dL (ref 3.5–5.0)
Alkaline Phosphatase: 84 U/L (ref 38–126)
Anion gap: 9 (ref 5–15)
BUN: 17 mg/dL (ref 8–23)
CO2: 27 mmol/L (ref 22–32)
Calcium: 9.4 mg/dL (ref 8.9–10.3)
Chloride: 103 mmol/L (ref 98–111)
Creatinine, Ser: 1.38 mg/dL — ABNORMAL HIGH (ref 0.61–1.24)
GFR, Estimated: 50 mL/min — ABNORMAL LOW (ref >60)
Glucose, Bld: 105 mg/dL — ABNORMAL HIGH (ref 70–99)
Potassium: 4.2 mmol/L (ref 3.5–5.1)
Sodium: 139 mmol/L (ref 135–145)
Total Bilirubin: 0.7 mg/dL (ref 0.0–1.2)
Total Protein: 7.3 g/dL (ref 6.5–8.1)

## 2023-11-29 LAB — URINALYSIS, ROUTINE W REFLEX MICROSCOPIC
Bilirubin Urine: NEGATIVE
Glucose, UA: NEGATIVE mg/dL
Hgb urine dipstick: NEGATIVE
Ketones, ur: 5 mg/dL — AB
Leukocytes,Ua: NEGATIVE
Nitrite: NEGATIVE
Protein, ur: NEGATIVE mg/dL
Specific Gravity, Urine: 1.014 (ref 1.005–1.030)
pH: 7 (ref 5.0–8.0)

## 2023-11-29 LAB — CBG MONITORING, ED: Glucose-Capillary: 109 mg/dL — ABNORMAL HIGH (ref 70–99)

## 2023-11-29 LAB — CBC
HCT: 40.5 % (ref 39.0–52.0)
Hemoglobin: 13.2 g/dL (ref 13.0–17.0)
MCH: 30.3 pg (ref 26.0–34.0)
MCHC: 32.6 g/dL (ref 30.0–36.0)
MCV: 93.1 fL (ref 80.0–100.0)
Platelets: 191 10*3/uL (ref 150–400)
RBC: 4.35 MIL/uL (ref 4.22–5.81)
RDW: 12.6 % (ref 11.5–15.5)
WBC: 4 10*3/uL (ref 4.0–10.5)
nRBC: 0 % (ref 0.0–0.2)

## 2023-11-29 LAB — ETHANOL: Alcohol, Ethyl (B): <10 mg/dL (ref <10)

## 2023-11-29 LAB — AMMONIA: Ammonia: 23 umol/L (ref 9–35)

## 2023-11-29 LAB — RAPID URINE DRUG SCREEN, HOSP PERFORMED
Amphetamines: NOT DETECTED
Barbiturates: NOT DETECTED
Benzodiazepines: NOT DETECTED
Cocaine: NOT DETECTED
Opiates: NOT DETECTED
Tetrahydrocannabinol: NOT DETECTED

## 2023-11-29 LAB — CK: Total CK: 75 U/L (ref 49–397)

## 2023-11-29 MED ORDER — LACTATED RINGERS IV BOLUS
1000.0000 mL | Freq: Once | INTRAVENOUS | Status: AC
  Administered 2023-11-29: 1000 mL via INTRAVENOUS

## 2023-11-29 MED ORDER — LORAZEPAM 2 MG/ML IJ SOLN
1.0000 mg | Freq: Once | INTRAMUSCULAR | Status: AC
  Administered 2023-11-29: 1 mg via INTRAVENOUS
  Filled 2023-11-29: qty 1

## 2023-11-29 MED ORDER — LORAZEPAM 2 MG/ML IJ SOLN
1.0000 mg | Freq: Once | INTRAMUSCULAR | Status: AC | PRN
  Administered 2023-11-29: 1 mg via INTRAVENOUS
  Filled 2023-11-29: qty 1

## 2023-11-29 NOTE — ED Provider Notes (Signed)
 Care assumed from Dr. Audrie Lia, patient with word-finding difficulty, generalized weakness, fall pending MRI to rule out stroke.  MRI is motion degraded but no evidence of stroke.  I have independently viewed the images, and agree with the radiologist's interpretation.  I have discussed the findings with the patient's daughter who explains to me that he actually has had more increased confusion and then aphasia.  She is uncomfortable taking him back to the skilled nursing facility.  I feel it is appropriate to be admitted for further evaluation of encephalopathy.  Case is discussed with Dr. Toniann Fail of [facility], who agrees to admit the patient.  Results for orders placed or performed during the hospital encounter of 11/29/23  CBG monitoring, ED   Collection Time: 11/29/23  6:07 PM  Result Value Ref Range   Glucose-Capillary 109 (H) 70 - 99 mg/dL  Comprehensive metabolic panel   Collection Time: 11/29/23  6:24 PM  Result Value Ref Range   Sodium 139 135 - 145 mmol/L   Potassium 4.2 3.5 - 5.1 mmol/L   Chloride 103 98 - 111 mmol/L   CO2 27 22 - 32 mmol/L   Glucose, Bld 105 (H) 70 - 99 mg/dL   BUN 17 8 - 23 mg/dL   Creatinine, Ser 4.09 (H) 0.61 - 1.24 mg/dL   Calcium 9.4 8.9 - 81.1 mg/dL   Total Protein 7.3 6.5 - 8.1 g/dL   Albumin 4.2 3.5 - 5.0 g/dL   AST 17 15 - 41 U/L   ALT 11 0 - 44 U/L   Alkaline Phosphatase 84 38 - 126 U/L   Total Bilirubin 0.7 0.0 - 1.2 mg/dL   GFR, Estimated 50 (L) >60 mL/min   Anion gap 9 5 - 15  CBC   Collection Time: 11/29/23  6:24 PM  Result Value Ref Range   WBC 4.0 4.0 - 10.5 K/uL   RBC 4.35 4.22 - 5.81 MIL/uL   Hemoglobin 13.2 13.0 - 17.0 g/dL   HCT 91.4 78.2 - 95.6 %   MCV 93.1 80.0 - 100.0 fL   MCH 30.3 26.0 - 34.0 pg   MCHC 32.6 30.0 - 36.0 g/dL   RDW 21.3 08.6 - 57.8 %   Platelets 191 150 - 400 K/uL   nRBC 0.0 0.0 - 0.2 %  Ammonia   Collection Time: 11/29/23  6:52 PM  Result Value Ref Range   Ammonia 23 9 - 35 umol/L  Ethanol    Collection Time: 11/29/23  6:52 PM  Result Value Ref Range   Alcohol, Ethyl (B) <10 <10 mg/dL  CK   Collection Time: 11/29/23  6:52 PM  Result Value Ref Range   Total CK 75 49 - 397 U/L  Urinalysis, Routine w reflex microscopic -Urine, Clean Catch   Collection Time: 11/29/23  8:27 PM  Result Value Ref Range   Color, Urine YELLOW YELLOW   APPearance CLEAR CLEAR   Specific Gravity, Urine 1.014 1.005 - 1.030   pH 7.0 5.0 - 8.0   Glucose, UA NEGATIVE NEGATIVE mg/dL   Hgb urine dipstick NEGATIVE NEGATIVE   Bilirubin Urine NEGATIVE NEGATIVE   Ketones, ur 5 (A) NEGATIVE mg/dL   Protein, ur NEGATIVE NEGATIVE mg/dL   Nitrite NEGATIVE NEGATIVE   Leukocytes,Ua NEGATIVE NEGATIVE  Rapid urine drug screen (hospital performed)   Collection Time: 11/29/23  8:27 PM  Result Value Ref Range   Opiates NONE DETECTED NONE DETECTED   Cocaine NONE DETECTED NONE DETECTED   Benzodiazepines NONE DETECTED NONE DETECTED  Amphetamines NONE DETECTED NONE DETECTED   Tetrahydrocannabinol NONE DETECTED NONE DETECTED   Barbiturates NONE DETECTED NONE DETECTED   MR BRAIN WO CONTRAST Result Date: 11/30/2023 CLINICAL DATA:  Initial evaluation for acute neuro deficit, stroke suspected. EXAM: MRI HEAD WITHOUT CONTRAST TECHNIQUE: Multiplanar, multiecho pulse sequences of the brain and surrounding structures were obtained without intravenous contrast. COMPARISON:  CT from 11/29/2023 FINDINGS: Brain: Examination severely degraded by motion artifact. Additionally, patient was unable to tolerate the full length of the exam. Mild age-related cerebral atrophy. No evidence for acute or subacute infarct. No visible areas of chronic cortical infarction. No visible acute or chronic intracranial blood products. No mass lesion, midline shift or mass effect. No hydrocephalus or extra-axial fluid collection. Pituitary gland grossly within normal limits. Vascular: Major intracranial vascular flow voids are grossly maintained at the skull  base, although poorly evaluated on this limited exam. Skull and upper cervical spine: Craniocervical junction within normal limits. Bone marrow signal intensity grossly normal. No visible scalp soft tissue abnormality. Sinuses/Orbits: Prior bowel ocular lens replacement. Paranasal sinuses and mastoid air cells appear largely clear. Other: None. IMPRESSION: 1. Severely motion degraded and limited exam. 2. Grossly negative brain MRI. No acute intracranial abnormality. Electronically Signed   By: Rise Mu M.D.   On: 11/30/2023 03:34   DG Chest Portable 1 View Result Date: 11/29/2023 CLINICAL DATA:  Fall, concern for fracture. EXAM: PORTABLE CHEST 1 VIEW, PORTABLE PELVIS COMPARISON:  01/18/2023. FINDINGS: Chest: The heart size and mediastinal contours are within normal limits. There is atherosclerotic calcification of the aorta. No consolidation, effusion, or pneumothorax is seen. No acute osseous abnormality is identified. Pelvis: No acute fracture or dislocation is seen. Mild degenerative changes are present at the hips bilaterally. Degenerative changes are noted in the lower lumbar spine IMPRESSION: 1. No acute cardiopulmonary process. 2. No acute fracture or dislocation at the pelvis. Electronically Signed   By: Thornell Sartorius M.D.   On: 11/29/2023 20:26   DG Pelvis Portable Result Date: 11/29/2023 CLINICAL DATA:  Fall, concern for fracture. EXAM: PORTABLE CHEST 1 VIEW, PORTABLE PELVIS COMPARISON:  01/18/2023. FINDINGS: Chest: The heart size and mediastinal contours are within normal limits. There is atherosclerotic calcification of the aorta. No consolidation, effusion, or pneumothorax is seen. No acute osseous abnormality is identified. Pelvis: No acute fracture or dislocation is seen. Mild degenerative changes are present at the hips bilaterally. Degenerative changes are noted in the lower lumbar spine IMPRESSION: 1. No acute cardiopulmonary process. 2. No acute fracture or dislocation at the  pelvis. Electronically Signed   By: Thornell Sartorius M.D.   On: 11/29/2023 20:26   CT Head Wo Contrast Result Date: 11/29/2023 CLINICAL DATA:  Increased falls and dysphagia. EXAM: CT HEAD WITHOUT CONTRAST TECHNIQUE: Contiguous axial images were obtained from the base of the skull through the vertex without intravenous contrast. RADIATION DOSE REDUCTION: This exam was performed according to the departmental dose-optimization program which includes automated exposure control, adjustment of the mA and/or kV according to patient size and/or use of iterative reconstruction technique. COMPARISON:  Jan 18, 2023 FINDINGS: Brain: There is mild cerebral atrophy with widening of the extra-axial spaces and ventricular dilatation. There are areas of decreased attenuation within the white matter tracts of the supratentorial brain, consistent with microvascular disease changes. Vascular: Mild to moderate severity bilateral cavernous carotid artery calcification is noted. Skull: Normal. Negative for fracture or focal lesion. Sinuses/Orbits: No acute finding. Other: None. IMPRESSION: 1. No acute intracranial abnormality. 2. Generalized cerebral atrophy and  microvascular disease changes of the supratentorial brain. Electronically Signed   By: Aram Candela M.D.   On: 11/29/2023 19:20      Dione Booze, MD 11/30/23 [id number]

## 2023-11-29 NOTE — ED Triage Notes (Addendum)
 Pt BIBA from [facility] for increased falls and dysphaia recently.  Pt has Hx of dementia and not oxygen dependent per EMS (not noted in medical history)   V/S stable with EMS CBG 183

## 2023-11-29 NOTE — ED Provider Notes (Signed)
 [place] EMERGENCY DEPARTMENT AT [facility] Provider Note  CSN: [id number] Arrival date & time: 11/29/23 1750  Chief Complaint(s) Altered Mental Status  HPI Chase Mccarthy is a 83 y.o. male with PMH T2DM, HTN, dementia who presents emergency room for evaluation of altered mental status.  Patient currently resident of [facility] and was found down this morning.  Last known well was yesterday.  Additional history obtained from patient's daughter who states that patient has become progressively more confused and had decreased ability to perform his ADLs over the last 4 days with intermittent word finding difficulty.  Additional history unable to be obtained as patient is currently altered.   Past Medical History Past Medical History:  Diagnosis Date   BPH (benign prostatic hyperplasia)    mild   Dermatitis    axillary   Diabetes mellitus (HCC)    Dyslipidemia    Hypertension    stable   Onychomycosis    Restrictive lung disease    Tobacco use    Tricuspid valve regurgitation    Patient Active Problem List   Diagnosis Date Noted   Protein-calorie malnutrition, severe 01/19/2023   AKI (acute kidney injury) (HCC) 01/18/2023   Hypertension    Diabetes mellitus (HCC)    Restrictive lung disease    Onychomycosis    Essential hypertension 01/13/2011    Class: Chronic   Diabetes mellitus type 2 in nonobese (HCC) 01/13/2011   Vitamin D deficiency 08/17/2009    Class: History of   Benign prostatic hyperplasia with urinary obstruction 01/13/2004    Class: Chronic   Hyperlipidemia with target LDL less than 100 10/28/1999    Class: History of   Tobacco abuse 10/15/1992    Class: Chronic   Home Medication(s) Prior to Admission medications   Medication Sig Start Date End Date Taking? Authorizing Provider  Acetaminophen (TYLENOL 8 HOUR PO) Take 500-1,000 mg by mouth every 8 (eight) hours as needed (Pain).    [provider]  amLODipine (NORVASC) 5 MG tablet  Take 1 tablet (5 mg total) by mouth daily. 01/25/23   Burnadette Pop, MD  aspirin EC 81 MG tablet Take 1 tablet (81 mg total) by mouth daily. Swallow whole. 01/25/23   Burnadette Pop, MD  dutasteride (AVODART) 0.5 MG capsule Take 0.5 mg by mouth daily.      [provider]  feeding supplement (ENSURE ENLIVE / ENSURE PLUS) LIQD Take 237 mLs by mouth 2 (two) times daily between meals. 01/24/23   Burnadette Pop, MD  melatonin 5 MG TABS Take 1 tablet (5 mg total) by mouth at bedtime as needed. 01/24/23   Burnadette Pop, MD  memantine (NAMENDA) 10 MG tablet Take 10 mg by mouth daily.    [provider]  Multiple Vitamins-Minerals (MULTIVITAL PO) Take 1 tablet by mouth daily.    [provider]  polyethylene glycol (MIRALAX / GLYCOLAX) 17 g packet Take 17 g by mouth daily as needed for mild constipation. 01/24/23   Burnadette Pop, MD  QUEtiapine (SEROQUEL) 50 MG tablet Take 1 tablet (50 mg total) by mouth at bedtime. 01/24/23   Burnadette Pop, MD  VITAMIN D PO Take 1 tablet by mouth daily.    [provider]  Past Surgical History Past Surgical History:  Procedure Laterality Date   CARDIAC CATHETERIZATION  11/20/2003   normal coronaries   NM MYOCAR PERF WALL MOTION  03/27/2012   normal   US ECHOCARDIOGRAPHY  09/23/2009   mild-mod LVH,EF =>55%,mild MVP,trace MR,mild to mod TR, AOV mildly sclerotic.   Family History Family History  Problem Relation Age of Onset   Heart failure Mother    Cancer Father     Social History Social History   Tobacco Use   Smoking status: Every Day    Types: Cigars   Smokeless tobacco: Never   Tobacco comments:    3 cigars daily  Substance Use Topics   Alcohol use: No   Drug use: No   Allergies Patient has no known allergies.  Review of Systems Review of Systems  Unable to perform ROS: Mental  status change    Physical Exam Vital Signs  I have reviewed the triage vital signs BP (!) 152/79   Pulse 73   Temp (!) 97.4 F (36.3 C) (Oral)   Resp 17   SpO2 100%   Physical Exam Constitutional:      General: He is not in acute distress.    Appearance: Normal appearance.  HENT:     Head: Normocephalic and atraumatic.     Nose: No congestion or rhinorrhea.  Eyes:     General:        Right eye: No discharge.        Left eye: No discharge.     Extraocular Movements: Extraocular movements intact.     Pupils: Pupils are equal, round, and reactive to light.  Cardiovascular:     Rate and Rhythm: Normal rate and regular rhythm.     Heart sounds: No murmur heard. Pulmonary:     Effort: No respiratory distress.     Breath sounds: No wheezing or rales.  Abdominal:     General: There is no distension.     Tenderness: There is no abdominal tenderness.  Musculoskeletal:        General: Normal range of motion.     Cervical back: Normal range of motion.  Skin:    General: Skin is warm and dry.  Neurological:     General: No focal deficit present.     Mental Status: He is alert. He is disoriented.     Sensory: No sensory deficit.     Motor: No weakness.     ED Results and Treatments Labs (all labs ordered are listed, but only abnormal results are displayed) Labs Reviewed  COMPREHENSIVE METABOLIC PANEL - Abnormal; Notable for the following components:      Result Value   Glucose, Bld 105 (*)    Creatinine, Ser 1.38 (*)    GFR, Estimated 50 (*)    All other components within normal limits  URINALYSIS, ROUTINE W REFLEX MICROSCOPIC - Abnormal; Notable for the following components:   Ketones, ur 5 (*)    All other components within normal limits  CBG MONITORING, ED - Abnormal; Notable for the following components:   Glucose-Capillary 109 (*)    All other components within normal limits  CBC  AMMONIA  RAPID URINE DRUG SCREEN, HOSP PERFORMED  ETHANOL  CK  Radiology DG Chest Portable 1 View Result Date: 11/29/2023 CLINICAL DATA:  Fall, concern for fracture. EXAM: PORTABLE CHEST 1 VIEW, PORTABLE PELVIS COMPARISON:  01/18/2023. FINDINGS: Chest: The heart size and mediastinal contours are within normal limits. There is atherosclerotic calcification of the aorta. No consolidation, effusion, or pneumothorax is seen. No acute osseous abnormality is identified. Pelvis: No acute fracture or dislocation is seen. Mild degenerative changes are present at the hips bilaterally. Degenerative changes are noted in the lower lumbar spine IMPRESSION: 1. No acute cardiopulmonary process. 2. No acute fracture or dislocation at the pelvis. Electronically Signed   By: Thornell Sartorius M.D.   On: 11/29/2023 20:26   DG Pelvis Portable Result Date: 11/29/2023 CLINICAL DATA:  Fall, concern for fracture. EXAM: PORTABLE CHEST 1 VIEW, PORTABLE PELVIS COMPARISON:  01/18/2023. FINDINGS: Chest: The heart size and mediastinal contours are within normal limits. There is atherosclerotic calcification of the aorta. No consolidation, effusion, or pneumothorax is seen. No acute osseous abnormality is identified. Pelvis: No acute fracture or dislocation is seen. Mild degenerative changes are present at the hips bilaterally. Degenerative changes are noted in the lower lumbar spine IMPRESSION: 1. No acute cardiopulmonary process. 2. No acute fracture or dislocation at the pelvis. Electronically Signed   By: Thornell Sartorius M.D.   On: 11/29/2023 20:26   CT Head Wo Contrast Result Date: 11/29/2023 CLINICAL DATA:  Increased falls and dysphagia. EXAM: CT HEAD WITHOUT CONTRAST TECHNIQUE: Contiguous axial images were obtained from the base of the skull through the vertex without intravenous contrast. RADIATION DOSE REDUCTION: This exam was performed according to the departmental dose-optimization program  which includes automated exposure control, adjustment of the mA and/or kV according to patient size and/or use of iterative reconstruction technique. COMPARISON:  Jan 18, 2023 FINDINGS: Brain: There is mild cerebral atrophy with widening of the extra-axial spaces and ventricular dilatation. There are areas of decreased attenuation within the white matter tracts of the supratentorial brain, consistent with microvascular disease changes. Vascular: Mild to moderate severity bilateral cavernous carotid artery calcification is noted. Skull: Normal. Negative for fracture or focal lesion. Sinuses/Orbits: No acute finding. Other: None. IMPRESSION: 1. No acute intracranial abnormality. 2. Generalized cerebral atrophy and microvascular disease changes of the supratentorial brain. Electronically Signed   By: Aram Candela M.D.   On: 11/29/2023 19:20    Pertinent labs & imaging results that were available during my care of the patient were reviewed by me and considered in my medical decision making (see MDM for details).  Medications Ordered in ED Medications  LORazepam (ATIVAN) injection 1 mg (has no administration in time range)  lactated ringers bolus 1,000 mL (1,000 mLs Intravenous New Bag/Given 11/29/23 2033)                                                                                                                                     Procedures Procedures  (including critical care time)  Medical Decision Making / ED Course   This patient presents to the ED for concern of altered mental status, this involves an extensive number of treatment options, and is a complaint that carries with it a high risk of complications and morbidity.  The differential diagnosis includes infection, metabolic/toxic encephalopathy, hypoglycemia, malperfusion, hypoxia, trauma or other intracranial process  MDM: Patient seen emergency room for evaluation of altered mental status.  Physical exam with no external signs  of trauma over the head, chest abdomen or pelvis, patient minimally responsive to commands but is moving all 4 extremities without difficulty.  Is having stuttering speech which daughter states is abnormal for him.  Laboratory evaluation with a creatinine of 1.38 but is otherwise unremarkable.  CK is normal.  UDS, urinalysis unremarkable.  CT head, chest x-ray, pelvis x-ray negative.  At time of signout, patient pending MRI brain to rule out CVA given word finding difficulty.  Please see provider signout note for continuation of workup.   Additional history obtained: -Additional history obtained from daughter -External records from outside source obtained and reviewed including: Chart review including previous notes, labs, imaging, consultation notes   Lab Tests: -I ordered, reviewed, and interpreted labs.   The pertinent results include:   Labs Reviewed  COMPREHENSIVE METABOLIC PANEL - Abnormal; Notable for the following components:      Result Value   Glucose, Bld 105 (*)    Creatinine, Ser 1.38 (*)    GFR, Estimated 50 (*)    All other components within normal limits  URINALYSIS, ROUTINE W REFLEX MICROSCOPIC - Abnormal; Notable for the following components:   Ketones, ur 5 (*)    All other components within normal limits  CBG MONITORING, ED - Abnormal; Notable for the following components:   Glucose-Capillary 109 (*)    All other components within normal limits  CBC  AMMONIA  RAPID URINE DRUG SCREEN, HOSP PERFORMED  ETHANOL  CK       Imaging Studies ordered: I ordered imaging studies including CT head, chest x-ray, pelvis x-ray I independently visualized and interpreted imaging. I agree with the radiologist interpretation  MRI brain ordered and is pending   Medicines ordered and prescription drug management: Meds ordered this encounter  Medications   lactated ringers bolus 1,000 mL   LORazepam (ATIVAN) injection 1 mg    -I have reviewed the patients home medicines  and have made adjustments as needed  Critical interventions none   Cardiac Monitoring: The patient was maintained on a cardiac monitor.  I personally viewed and interpreted the cardiac monitored which showed an underlying rhythm of: NSR  Social Determinants of Health:  Factors impacting patients care include: Lives in skilled nursing facility   Reevaluation: After the interventions noted above, I reevaluated the patient and found that they have :stayed the same  Co morbidities that complicate the patient evaluation  Past Medical History:  Diagnosis Date   BPH (benign prostatic hyperplasia)    mild   Dermatitis    axillary   Diabetes mellitus (HCC)    Dyslipidemia    Hypertension    stable   Onychomycosis    Restrictive lung disease    Tobacco use    Tricuspid valve regurgitation       Dispostion: I considered admission for this patient, and patient pending completion of imaging studies at time of signout.  Please see provider signout note for continuation of workup.    Final Clinical Impression(s) / ED Diagnoses Final diagnoses:  None     @  Charlyne Petrin, MD 11/29/23 2239

## 2023-11-30 ENCOUNTER — Observation Stay (HOSPITAL_COMMUNITY)
Admit: 2023-11-30 | Discharge: 2023-11-30 | Disposition: A | Discharge status: Home / Self Care | Attending: Internal Medicine | Admitting: Internal Medicine

## 2023-11-30 ENCOUNTER — Emergency Department (HOSPITAL_COMMUNITY)

## 2023-11-30 DIAGNOSIS — G9340 Encephalopathy, unspecified: Secondary | ICD-10-CM | POA: Diagnosis not present

## 2023-11-30 DIAGNOSIS — G319 Degenerative disease of nervous system, unspecified: Secondary | ICD-10-CM | POA: Diagnosis not present

## 2023-11-30 DIAGNOSIS — R569 Unspecified convulsions: Secondary | ICD-10-CM | POA: Diagnosis not present

## 2023-11-30 DIAGNOSIS — I10 Essential (primary) hypertension: Secondary | ICD-10-CM | POA: Diagnosis not present

## 2023-11-30 DIAGNOSIS — N1831 Chronic kidney disease, stage 3a: Secondary | ICD-10-CM | POA: Diagnosis not present

## 2023-11-30 DIAGNOSIS — R29818 Other symptoms and signs involving the nervous system: Secondary | ICD-10-CM | POA: Diagnosis not present

## 2023-11-30 DIAGNOSIS — N183 Chronic kidney disease, stage 3 unspecified: Secondary | ICD-10-CM | POA: Insufficient documentation

## 2023-11-30 LAB — CBC WITH DIFFERENTIAL/PLATELET
Abs Immature Granulocytes: 0.01 10*3/uL (ref 0.00–0.07)
Basophils Absolute: 0.1 10*3/uL (ref 0.0–0.1)
Basophils Relative: 1 %
Eosinophils Absolute: 0.2 10*3/uL (ref 0.0–0.5)
Eosinophils Relative: 7 %
HCT: 38.4 % — ABNORMAL LOW (ref 39.0–52.0)
Hemoglobin: 12.4 g/dL — ABNORMAL LOW (ref 13.0–17.0)
Immature Granulocytes: 0 %
Lymphocytes Relative: 40 %
Lymphs Abs: 1.4 10*3/uL (ref 0.7–4.0)
MCH: 30.4 pg (ref 26.0–34.0)
MCHC: 32.3 g/dL (ref 30.0–36.0)
MCV: 94.1 fL (ref 80.0–100.0)
Monocytes Absolute: 0.4 10*3/uL (ref 0.1–1.0)
Monocytes Relative: 11 %
Neutro Abs: 1.4 10*3/uL — ABNORMAL LOW (ref 1.7–7.7)
Neutrophils Relative %: 41 %
Platelets: 162 10*3/uL (ref 150–400)
RBC: 4.08 MIL/uL — ABNORMAL LOW (ref 4.22–5.81)
RDW: 12.8 % (ref 11.5–15.5)
WBC: 3.5 10*3/uL — ABNORMAL LOW (ref 4.0–10.5)
nRBC: 0 % (ref 0.0–0.2)

## 2023-11-30 LAB — GLUCOSE, CAPILLARY
Glucose-Capillary: 106 mg/dL — ABNORMAL HIGH (ref 70–99)
Glucose-Capillary: 111 mg/dL — ABNORMAL HIGH (ref 70–99)
Glucose-Capillary: 67 mg/dL — ABNORMAL LOW (ref 70–99)

## 2023-11-30 LAB — COMPREHENSIVE METABOLIC PANEL
ALT: 12 U/L (ref 0–44)
AST: 20 U/L (ref 15–41)
Albumin: 3.4 g/dL — ABNORMAL LOW (ref 3.5–5.0)
Alkaline Phosphatase: 77 U/L (ref 38–126)
Anion gap: 7 (ref 5–15)
BUN: 16 mg/dL (ref 8–23)
CO2: 25 mmol/L (ref 22–32)
Calcium: 9 mg/dL (ref 8.9–10.3)
Chloride: 107 mmol/L (ref 98–111)
Creatinine, Ser: 1.28 mg/dL — ABNORMAL HIGH (ref 0.61–1.24)
GFR, Estimated: 55 mL/min — ABNORMAL LOW (ref >60)
Glucose, Bld: 80 mg/dL (ref 70–99)
Potassium: 4.4 mmol/L (ref 3.5–5.1)
Sodium: 139 mmol/L (ref 135–145)
Total Bilirubin: 1.3 mg/dL — ABNORMAL HIGH (ref 0.0–1.2)
Total Protein: 6.3 g/dL — ABNORMAL LOW (ref 6.5–8.1)

## 2023-11-30 LAB — CBG MONITORING, ED
Glucose-Capillary: 74 mg/dL (ref 70–99)
Glucose-Capillary: 106 mg/dL — ABNORMAL HIGH (ref 70–99)
Glucose-Capillary: 111 mg/dL — ABNORMAL HIGH (ref 70–99)
Glucose-Capillary: 63 mg/dL — ABNORMAL LOW (ref 70–99)

## 2023-11-30 LAB — MAGNESIUM: Magnesium: 2.1 mg/dL (ref 1.7–2.4)

## 2023-11-30 LAB — TSH: TSH: 1.193 u[IU]/mL (ref 0.350–4.500)

## 2023-11-30 MED ORDER — DUTASTERIDE 0.5 MG PO CAPS: 0.5000 mg | ORAL_CAPSULE | Freq: Every day | ORAL | Status: DC

## 2023-11-30 MED ORDER — AMLODIPINE BESYLATE 5 MG PO TABS
5.0000 mg | ORAL_TABLET | Freq: Every day | ORAL | Status: DC
  Administered 2023-11-30 – 2023-12-06 (×7): 5 mg via ORAL
  Filled 2023-11-30 (×7): qty 1

## 2023-11-30 MED ORDER — DUTASTERIDE 0.5 MG PO CAPS
0.5000 mg | ORAL_CAPSULE | Freq: Every day | ORAL | Status: DC
  Administered 2023-11-30 – 2023-12-05 (×6): 0.5 mg via ORAL
  Filled 2023-11-30 (×6): qty 1

## 2023-11-30 MED ORDER — ENOXAPARIN SODIUM 40 MG/0.4ML IJ SOSY: 40.0000 mg | PREFILLED_SYRINGE | INTRAMUSCULAR | Status: DC

## 2023-11-30 MED ORDER — MEMANTINE HCL 5 MG PO TABS: 10.0000 mg | ORAL_TABLET | Freq: Every day | ORAL | Status: DC

## 2023-11-30 MED ORDER — DEXTROSE 50 % IV SOLN
25.0000 mL | Freq: Once | INTRAVENOUS | Status: AC
  Administered 2023-11-30: 25 mL via INTRAVENOUS
  Filled 2023-11-30: qty 50

## 2023-11-30 MED ORDER — AMLODIPINE BESYLATE 5 MG PO TABS: 5.0000 mg | ORAL_TABLET | Freq: Every day | ORAL | Status: DC

## 2023-11-30 MED ORDER — DEXTROSE IN LACTATED RINGERS 5 % IV SOLN: INTRAVENOUS | Status: AC

## 2023-11-30 MED ORDER — LORAZEPAM 2 MG/ML IJ SOLN
1.0000 mg | Freq: Once | INTRAMUSCULAR | Status: AC
  Administered 2023-11-30: 1 mg via INTRAVENOUS
  Filled 2023-11-30: qty 1

## 2023-11-30 MED ORDER — MELATONIN 3 MG PO TABS
3.0000 mg | ORAL_TABLET | Freq: Every evening | ORAL | Status: DC | PRN
  Administered 2023-12-01 – 2023-12-05 (×3): 3 mg via ORAL
  Filled 2023-11-30 (×3): qty 1

## 2023-11-30 NOTE — Evaluation (Signed)
 SLP Cancellation Note  Patient Details Name: Chase Mccarthy MRN: [id number] DOB: 29-Oct-1940   Cancelled treatment:       Reason Eval/Treat Not Completed: Fatigue/lethargy limiting ability to participate (pt did not awaken adequately for po or evaluation with verbal or thermal stimulation; requested RN message SLP if he awakens for eval, will continue efforts as schedule allows) Rolena Infante, MS [facility] SLP Acute Rehab Services Office [phone]   Chales Abrahams 11/30/2023, 10:41 AM

## 2023-11-30 NOTE — H&P (Addendum)
 History and Physical    Chase Mccarthy:[id number] DOB: April 22, 1941 DOA: 11/29/2023  Patient coming from: Assisted living facility.  Chief Complaint: Fall and confusion.  HPI: Chase Mccarthy is a 83 y.o. male with history of dementia, hypertension, BPH, chronic kidney disease stage II, diabetes mellitus type 2 was brought to the ER after patient had an unwitnessed fall at the living facility.  Patient's roommate heard a fall and a sound and patient hit his head following which patient became confused.  Patient's daughter was providing the history states that after she went to check on him he was not able to communicate with her.  Patient was brought to the ER.  As per the patient's daughter patient was taken off Seroquel last week.  ED Course: In the ER patient appeared confused and as per the daughter was not had his baseline.  Patient is afebrile.  Creatinine 1.3 ammonia 23 UA unremarkable.  MRI brain negative.  X-ray pelvis chest x-ray unremarkable.  Urine drug screen negative.  Patient did receive 2 mg of IV Ativan in the ER at the time of my exam patient still stated.  Patient received 1 L fluid bolus.  Admitted for further observation.  Review of Systems: As per HPI, rest all negative.   Past Medical History:  Diagnosis Date   BPH (benign prostatic hyperplasia)    mild   Dermatitis    axillary   Diabetes mellitus (HCC)    Dyslipidemia    Hypertension    stable   Onychomycosis    Restrictive lung disease    Tobacco use    Tricuspid valve regurgitation     Past Surgical History:  Procedure Laterality Date   CARDIAC CATHETERIZATION  11/20/2003   normal coronaries   NM MYOCAR PERF WALL MOTION  03/27/2012   normal   US ECHOCARDIOGRAPHY  09/23/2009   mild-mod LVH,EF =>55%,mild MVP,trace MR,mild to mod TR, AOV mildly sclerotic.     reports that he has been smoking cigars. He has never used smokeless tobacco. He reports that he does not drink alcohol and does not use drugs.  No  Known Allergies  Family History  Problem Relation Age of Onset   Heart failure Mother    Cancer Father     Prior to Admission medications   Medication Sig Start Date End Date Taking? Authorizing Provider  Acetaminophen (TYLENOL 8 HOUR PO) Take 500-1,000 mg by mouth every 8 (eight) hours as needed (Pain).    [provider]  amLODipine (NORVASC) 5 MG tablet Take 1 tablet (5 mg total) by mouth daily. 01/25/23   Burnadette Pop, MD  aspirin EC 81 MG tablet Take 1 tablet (81 mg total) by mouth daily. Swallow whole. 01/25/23   Burnadette Pop, MD  dutasteride (AVODART) 0.5 MG capsule Take 0.5 mg by mouth daily.      [provider]  feeding supplement (ENSURE ENLIVE / ENSURE PLUS) LIQD Take 237 mLs by mouth 2 (two) times daily between meals. 01/24/23   Burnadette Pop, MD  melatonin 5 MG TABS Take 1 tablet (5 mg total) by mouth at bedtime as needed. 01/24/23   Burnadette Pop, MD  memantine (NAMENDA) 10 MG tablet Take 10 mg by mouth daily.    [provider]  Multiple Vitamins-Minerals (MULTIVITAL PO) Take 1 tablet by mouth daily.    [provider]  polyethylene glycol (MIRALAX / GLYCOLAX) 17 g packet Take 17 g by mouth daily as needed for mild constipation. 01/24/23  Burnadette Pop, MD  QUEtiapine (SEROQUEL) 50 MG tablet Take 1 tablet (50 mg total) by mouth at bedtime. 01/24/23   Burnadette Pop, MD  VITAMIN D PO Take 1 tablet by mouth daily.    [provider]    Physical Exam: Constitutional: Moderately built and nourished. Vitals:   11/30/23 0215 11/30/23 0230 11/30/23 0400 11/30/23 0530  BP:  125/71 (!) 152/76 136/76  Pulse:  (!) 51 (!) 54 (!) 52  Resp:  16 17 15   Temp: (!) 97.5 F (36.4 C)   (!) 97.4 F (36.3 C)  TempSrc:      SpO2:  99% 98% 100%   Eyes: Anicteric no pallor. ENMT: No discharge from the ears eyes nose or mouth. Neck: No mass felt.  No neck rigidity. Respiratory: No rhonchi or crepitations. Cardiovascular: S1-S2  heard. Abdomen: Soft nontender bowel sound present. Musculoskeletal: No edema. Skin: No rash. Neurologic: Patient is lethargic after receiving Ativan.  Pupils reacting to light.  Need to reassess once patient is more alert awake. Psychiatric: Lethargic after receiving Ativan.   Labs on Admission: I have personally reviewed following labs and imaging studies  CBC: Recent Labs  Lab 11/29/23 1824  WBC 4.0  HGB 13.2  HCT 40.5  MCV 93.1  PLT 191   Basic Metabolic Panel: Recent Labs  Lab 11/29/23 1824  NA 139  K 4.2  CL 103  CO2 27  GLUCOSE 105*  BUN 17  CREATININE 1.38*  CALCIUM 9.4   GFR: CrCl cannot be calculated (Unknown ideal weight.). Liver Function Tests: Recent Labs  Lab 11/29/23 1824  AST 17  ALT 11  ALKPHOS 84  BILITOT 0.7  PROT 7.3  ALBUMIN 4.2   No results for input(s): "LIPASE", "AMYLASE" in the last 168 hours. Recent Labs  Lab 11/29/23 1852  AMMONIA 23   Coagulation Profile: No results for input(s): "INR", "PROTIME" in the last 168 hours. Cardiac Enzymes: Recent Labs  Lab 11/29/23 1852  CKTOTAL 75   BNP (last 3 results) No results for input(s): "PROBNP" in the last 8760 hours. HbA1C: No results for input(s): "HGBA1C" in the last 72 hours. CBG: Recent Labs  Lab 11/29/23 1807  GLUCAP 109*   Lipid Profile: No results for input(s): "CHOL", "HDL", "LDLCALC", "TRIG", "CHOLHDL", "LDLDIRECT" in the last 72 hours. Thyroid Function Tests: No results for input(s): "TSH", "T4TOTAL", "FREET4", "T3FREE", "THYROIDAB" in the last 72 hours. Anemia Panel: No results for input(s): "VITAMINB12", "FOLATE", "FERRITIN", "TIBC", "IRON", "RETICCTPCT" in the last 72 hours. Urine analysis:    Component Value Date/Time   COLORURINE YELLOW 11/29/2023 2027   APPEARANCEUR CLEAR 11/29/2023 2027   LABSPEC 1.014 11/29/2023 2027   PHURINE 7.0 11/29/2023 2027   GLUCOSEU NEGATIVE 11/29/2023 2027   HGBUR NEGATIVE 11/29/2023 2027   BILIRUBINUR NEGATIVE 11/29/2023  2027   KETONESUR 5 (A) 11/29/2023 2027   PROTEINUR NEGATIVE 11/29/2023 2027   NITRITE NEGATIVE 11/29/2023 2027   LEUKOCYTESUR NEGATIVE 11/29/2023 2027   Sepsis Labs: @LABRCNTIP (procalcitonin:4,lacticidven:4) )No results found for this or any previous visit (from the past 240 hours).   Radiological Exams on Admission: MR BRAIN WO CONTRAST Result Date: 11/30/2023 CLINICAL DATA:  Initial evaluation for acute neuro deficit, stroke suspected. EXAM: MRI HEAD WITHOUT CONTRAST TECHNIQUE: Multiplanar, multiecho pulse sequences of the brain and surrounding structures were obtained without intravenous contrast. COMPARISON:  CT from 11/29/2023 FINDINGS: Brain: Examination severely degraded by motion artifact. Additionally, patient was unable to tolerate the full length of the exam. Mild age-related cerebral atrophy. No evidence  for acute or subacute infarct. No visible areas of chronic cortical infarction. No visible acute or chronic intracranial blood products. No mass lesion, midline shift or mass effect. No hydrocephalus or extra-axial fluid collection. Pituitary gland grossly within normal limits. Vascular: Major intracranial vascular flow voids are grossly maintained at the skull base, although poorly evaluated on this limited exam. Skull and upper cervical spine: Craniocervical junction within normal limits. Bone marrow signal intensity grossly normal. No visible scalp soft tissue abnormality. Sinuses/Orbits: Prior bowel ocular lens replacement. Paranasal sinuses and mastoid air cells appear largely clear. Other: None. IMPRESSION: 1. Severely motion degraded and limited exam. 2. Grossly negative brain MRI. No acute intracranial abnormality. Electronically Signed   By: Rise Mu M.D.   On: 11/30/2023 03:34   DG Chest Portable 1 View Result Date: 11/29/2023 CLINICAL DATA:  Fall, concern for fracture. EXAM: PORTABLE CHEST 1 VIEW, PORTABLE PELVIS COMPARISON:  01/18/2023. FINDINGS: Chest: The heart  size and mediastinal contours are within normal limits. There is atherosclerotic calcification of the aorta. No consolidation, effusion, or pneumothorax is seen. No acute osseous abnormality is identified. Pelvis: No acute fracture or dislocation is seen. Mild degenerative changes are present at the hips bilaterally. Degenerative changes are noted in the lower lumbar spine IMPRESSION: 1. No acute cardiopulmonary process. 2. No acute fracture or dislocation at the pelvis. Electronically Signed   By: Thornell Sartorius M.D.   On: 11/29/2023 20:26   DG Pelvis Portable Result Date: 11/29/2023 CLINICAL DATA:  Fall, concern for fracture. EXAM: PORTABLE CHEST 1 VIEW, PORTABLE PELVIS COMPARISON:  01/18/2023. FINDINGS: Chest: The heart size and mediastinal contours are within normal limits. There is atherosclerotic calcification of the aorta. No consolidation, effusion, or pneumothorax is seen. No acute osseous abnormality is identified. Pelvis: No acute fracture or dislocation is seen. Mild degenerative changes are present at the hips bilaterally. Degenerative changes are noted in the lower lumbar spine IMPRESSION: 1. No acute cardiopulmonary process. 2. No acute fracture or dislocation at the pelvis. Electronically Signed   By: Thornell Sartorius M.D.   On: 11/29/2023 20:26   CT Head Wo Contrast Result Date: 11/29/2023 CLINICAL DATA:  Increased falls and dysphagia. EXAM: CT HEAD WITHOUT CONTRAST TECHNIQUE: Contiguous axial images were obtained from the base of the skull through the vertex without intravenous contrast. RADIATION DOSE REDUCTION: This exam was performed according to the departmental dose-optimization program which includes automated exposure control, adjustment of the mA and/or kV according to patient size and/or use of iterative reconstruction technique. COMPARISON:  Jan 18, 2023 FINDINGS: Brain: There is mild cerebral atrophy with widening of the extra-axial spaces and ventricular dilatation. There are areas of  decreased attenuation within the white matter tracts of the supratentorial brain, consistent with microvascular disease changes. Vascular: Mild to moderate severity bilateral cavernous carotid artery calcification is noted. Skull: Normal. Negative for fracture or focal lesion. Sinuses/Orbits: No acute finding. Other: None. IMPRESSION: 1. No acute intracranial abnormality. 2. Generalized cerebral atrophy and microvascular disease changes of the supratentorial brain. Electronically Signed   By: Aram Candela M.D.   On: 11/29/2023 19:20    EKG: Independently reviewed.  Normal sinus rhythm.  Assessment/Plan Principal Problem:   Acute encephalopathy Active Problems:   Essential hypertension   Benign prostatic hyperplasia with urinary obstruction   Hypertension   CKD (chronic kidney disease) stage 3, GFR 30-59 ml/min (HCC)    Acute encephalopathy -   patient has dementia and change in mental status observed after patient had a fall and  hit his head.  Patient's daughter found that it was difficult for patient to talk to her after the fall.  MRI brain is negative for stroke.  Patient is afebrile.  Ammonia levels are normal.  Not sure if patient had a concussion.  At this time patient is still lethargic after receiving Ativan.  Will try to reassess after Ativan wears off.  Check CBG.  Check EEG.  EKG is pending.  Continue to monitor. Hypertension takes amlodipine. BPH takes Avodart. Dementia takes Namenda.  Was recently taken off Seroquel last week. History of diabetes mellitus type 2 not on any medication at this time.  Last hemoglobin A1c was 6.3 ten months ago. Chronic kidney disease stage III creatinine around baseline.  Since patient has acute encephalopathy need close monitoring and further workup and more than 2 midnight stay.   DVT prophylaxis: Lovenox. Code Status: Full code confirmed with patient's daughter. Family Communication: Patient's daughter Ms. Akridge. Disposition Plan:  Monitored bed. Consults called: None. Admission status: Observation.

## 2023-11-30 NOTE — Progress Notes (Signed)
 EEG complete - results pending

## 2023-11-30 NOTE — Procedures (Signed)
 Patient Name: Chase Mccarthy  MRN: [id number]  Epilepsy Attending: Charlsie Quest  Referring Physician/Provider: Eduard Clos, MD  Date: 11/30/2023 Duration: 25.04 mins  Patient history: 83yo M with ams. EEG to evaluate for seizure  Level of alertness: Awake  AEDs during EEG study: None  Technical aspects: This EEG study was done with scalp electrodes positioned according to the 10-20 International system of electrode placement. Electrical activity was reviewed with band pass filter of 1-70Hz , sensitivity of 7 uV/mm, display speed of 15mm/sec with a 60Hz  notched filter applied as appropriate. EEG data were recorded continuously and digitally stored.  Video monitoring was available and reviewed as appropriate.  Description: The posterior dominant rhythm consists of 8 Hz activity of moderate voltage (25-35 uV) seen predominantly in posterior head regions, symmetric and reactive to eye opening and eye closing. EEG showed intermittent generalized 3 to 6 Hz theta-delta slowing. Hyperventilation and photic stimulation were not performed.     ABNORMALITY - Intermittent slow, generalized  IMPRESSION: This study is suggestive of mild diffuse encephalopathy. No seizures or epileptiform discharges were seen throughout the recording.  [place] Sarinana Annabelle Harman

## 2023-11-30 NOTE — Progress Notes (Signed)
 Pt seen and admitted by Dr Toniann Fail earlier this am. Please see detailed note for H&P.    Chase Mccarthy is a 83 y.o. male with history of dementia, hypertension, BPH, chronic kidney disease stage II, diabetes mellitus type 2 was brought to the ER after patient had an unwitnessed fall at the living facility.   . X-ray pelvis chest x-ray unremarkable. Urine drug screen negative.   Patient admitted for acute metabolic encephalopathy . MRI brain is negative for acute stroke.   Monitor.    Kathlen Mody, MD

## 2023-11-30 NOTE — ED Notes (Signed)
This RN accompanied pt to MRI

## 2023-11-30 NOTE — ED Notes (Signed)
 Medications left at bedside. Pt is lethargic and unable to stay awake to take medications.

## 2023-12-01 ENCOUNTER — Other Ambulatory Visit: Payer: Self-pay

## 2023-12-01 DIAGNOSIS — N1831 Chronic kidney disease, stage 3a: Secondary | ICD-10-CM

## 2023-12-01 DIAGNOSIS — E119 Type 2 diabetes mellitus without complications: Secondary | ICD-10-CM | POA: Diagnosis not present

## 2023-12-01 DIAGNOSIS — I10 Essential (primary) hypertension: Secondary | ICD-10-CM

## 2023-12-01 DIAGNOSIS — G9340 Encephalopathy, unspecified: Secondary | ICD-10-CM | POA: Diagnosis not present

## 2023-12-01 DIAGNOSIS — N289 Disorder of kidney and ureter, unspecified: Secondary | ICD-10-CM | POA: Diagnosis not present

## 2023-12-01 LAB — BASIC METABOLIC PANEL
Anion gap: 6 (ref 5–15)
BUN: 17 mg/dL (ref 8–23)
CO2: 26 mmol/L (ref 22–32)
Calcium: 8.9 mg/dL (ref 8.9–10.3)
Chloride: 108 mmol/L (ref 98–111)
Creatinine, Ser: 1.23 mg/dL (ref 0.61–1.24)
GFR, Estimated: 58 mL/min — ABNORMAL LOW (ref >60)
Glucose, Bld: 105 mg/dL — ABNORMAL HIGH (ref 70–99)
Potassium: 3.8 mmol/L (ref 3.5–5.1)
Sodium: 140 mmol/L (ref 135–145)

## 2023-12-01 LAB — CBC WITH DIFFERENTIAL/PLATELET
Abs Immature Granulocytes: 0 10*3/uL (ref 0.00–0.07)
Basophils Absolute: 0 10*3/uL (ref 0.0–0.1)
Basophils Relative: 1 %
Eosinophils Absolute: 0.3 10*3/uL (ref 0.0–0.5)
Eosinophils Relative: 9 %
HCT: 40.5 % (ref 39.0–52.0)
Hemoglobin: 13.3 g/dL (ref 13.0–17.0)
Immature Granulocytes: 0 %
Lymphocytes Relative: 29 %
Lymphs Abs: 1.1 10*3/uL (ref 0.7–4.0)
MCH: 30.1 pg (ref 26.0–34.0)
MCHC: 32.8 g/dL (ref 30.0–36.0)
MCV: 91.6 fL (ref 80.0–100.0)
Monocytes Absolute: 0.3 10*3/uL (ref 0.1–1.0)
Monocytes Relative: 9 %
Neutro Abs: 2 10*3/uL (ref 1.7–7.7)
Neutrophils Relative %: 52 %
Platelets: 171 10*3/uL (ref 150–400)
RBC: 4.42 MIL/uL (ref 4.22–5.81)
RDW: 12.4 % (ref 11.5–15.5)
WBC: 3.8 10*3/uL — ABNORMAL LOW (ref 4.0–10.5)
nRBC: 0 % (ref 0.0–0.2)

## 2023-12-01 LAB — GLUCOSE, CAPILLARY
Glucose-Capillary: 90 mg/dL (ref 70–99)
Glucose-Capillary: 101 mg/dL — ABNORMAL HIGH (ref 70–99)
Glucose-Capillary: 110 mg/dL — ABNORMAL HIGH (ref 70–99)
Glucose-Capillary: 112 mg/dL — ABNORMAL HIGH (ref 70–99)
Glucose-Capillary: 85 mg/dL (ref 70–99)

## 2023-12-01 MED ORDER — HYDROXYZINE HCL 25 MG PO TABS
25.0000 mg | ORAL_TABLET | Freq: Three times a day (TID) | ORAL | Status: DC | PRN
  Administered 2023-12-01 – 2023-12-04 (×4): 25 mg via ORAL
  Filled 2023-12-01 (×5): qty 1

## 2023-12-01 MED ORDER — DEXTROSE IN LACTATED RINGERS 5 % IV SOLN: INTRAVENOUS | Status: AC

## 2023-12-01 MED ORDER — HALOPERIDOL LACTATE 5 MG/ML IJ SOLN: 1.0000 mg | Freq: Four times a day (QID) | INTRAMUSCULAR | Status: DC | PRN

## 2023-12-01 MED ORDER — ENOXAPARIN SODIUM 40 MG/0.4ML IJ SOSY
40.0000 mg | PREFILLED_SYRINGE | Freq: Every day | INTRAMUSCULAR | Status: DC
  Administered 2023-12-01 – 2023-12-06 (×6): 40 mg via SUBCUTANEOUS
  Filled 2023-12-01 (×6): qty 0.4

## 2023-12-01 NOTE — TOC Initial Note (Addendum)
 Transition of Care [facility]) - Initial/Assessment Note    Patient Details  Name: Chase Mccarthy MRN: [id number] Date of Birth: 1941/04/29  Transition of [facility]) CM/SW Contact:    Larrie Kass, LCSW Phone Number: 12/01/2023, 9:11 AM  Clinical Narrative:                 CSW spoke with the pt's daughter via phone to discuss the MOON Notice due to the pt's altered mental status. Pt's daughter reports she will be coming to the hospital and will sign form in person.   CSW confirmed with [facility] that the pt is an LTC resident at [facility]. Pt's daughter is requesting to transfer the pt to a different LTC facility. CSW explained that the daughter will need to work with the pt's current LTC facility to facilitate the transition to a new one. The pt will need PTAR for transport upon d/c. TOC to follow.  Adden 11:40am Pt's daughter came to hospital and refused to sign Medicare notice form. Form was completed and placed in pt's room . TOC to follow.   Expected Discharge Plan: Long Term Nursing Home Barriers to Discharge: Continued Medical Work up   Patient Goals and CMS Choice Patient states their goals for this hospitalization and ongoing recovery are:: return to LTC faciltiy          Expected Discharge Plan and Services       Living arrangements for the past 2 months: Skilled Nursing Facility                                      Prior Living Arrangements/Services Living arrangements for the past 2 months: Skilled Nursing Facility Lives with:: Self Patient language and need for interpreter reviewed:: Yes        Need for Family Participation in Patient Care: Yes (Comment) Care giver support system in place?: Yes (comment)   Criminal Activity/Legal Involvement Pertinent to Current Situation/Hospitalization: No - Comment as needed  Activities of Daily Living      Permission Sought/Granted                  Emotional Assessment    Attitude/Demeanor/Rapport: Unable to Assess Affect (typically observed): Unable to Assess Orientation: : Oriented to Self      Admission diagnosis:  Renal insufficiency [N28.9] Acute encephalopathy [G93.40] Elevated random blood glucose level [R73.9] Encephalopathy, unspecified type [G93.40] Patient Active Problem List   Diagnosis Date Noted   Acute encephalopathy 11/30/2023   CKD (chronic kidney disease) stage 3, GFR 30-59 ml/min (HCC) 11/30/2023   Protein-calorie malnutrition, severe 01/19/2023   AKI (acute kidney injury) (HCC) 01/18/2023   Hypertension    Diabetes mellitus (HCC)    Restrictive lung disease    Onychomycosis    Essential hypertension 01/13/2011    Class: Chronic   Diabetes mellitus type 2 in nonobese (HCC) 01/13/2011   Vitamin D deficiency 08/17/2009    Class: History of   Benign prostatic hyperplasia with urinary obstruction 01/13/2004    Class: Chronic   Hyperlipidemia with target LDL less than 100 10/28/1999    Class: History of   Tobacco abuse 10/15/1992    Class: Chronic   PCP:  Gwenyth Bender, MD Pharmacy:   [facility] [phone] [place], [place] - [address] [place] [address] Phone: [phone] Fax: [phone]  [facility] [phone] -  [place], [place] - [address] [place] [address] Phone: [phone] Fax: [phone]     Social Drivers of Health (SDOH) Social History: SDOH Screenings   Food Insecurity: Patient Unable To Answer (12/01/2023)  Housing: Patient Unable To Answer (12/01/2023)  Transportation Needs: Patient Unable To Answer (12/01/2023)  Utilities: Patient Unable To Answer (12/01/2023)  Social Connections: Patient Unable To Answer (12/01/2023)  Tobacco Use: High Risk (11/29/2023)   SDOH Interventions:     Readmission Risk Interventions     No data to display

## 2023-12-01 NOTE — Plan of Care (Addendum)
 VSS. BG stable. No BM this shift. Patient intermittently attempting to exit bed overnight requiring redirection. D5LR infusing at 72ml/hr. No acute events overnight. Bed left in low, locked position with bed alarm on.   Problem: Clinical Measurements: Goal: Ability to maintain clinical measurements within normal limits will improve Outcome: Progressing Goal: Will remain free from infection Outcome: Progressing   Problem: Activity: Goal: Risk for activity intolerance will decrease Outcome: Progressing   Problem: Safety: Goal: Ability to remain free from injury will improve Outcome: Progressing   Problem: Skin Integrity: Goal: Risk for impaired skin integrity will decrease Outcome: Progressing   Problem: Metabolic: Goal: Ability to maintain appropriate glucose levels will improve Outcome: Progressing   Problem: Nutritional: Goal: Maintenance of adequate nutrition will improve Outcome: Progressing

## 2023-12-01 NOTE — Evaluation (Signed)
 Clinical/Bedside Swallow Evaluation Patient Details  Name: Chase Mccarthy MRN: [id number] Date of Birth: [birth year]  Today's Date: 12/01/2023 Time: SLP Start Time (ACUTE ONLY): 0910 SLP Stop Time (ACUTE ONLY): 0935 SLP Time Calculation (min) (ACUTE ONLY): 25 min  Past Medical History:  Past Medical History:  Diagnosis Date   BPH (benign prostatic hyperplasia)    mild   Dermatitis    axillary   Diabetes mellitus (HCC)    Dyslipidemia    Hypertension    stable   Onychomycosis    Restrictive lung disease    Tobacco use    Tricuspid valve regurgitation    Past Surgical History:  Past Surgical History:  Procedure Laterality Date   CARDIAC CATHETERIZATION  11/20/2003   normal coronaries   NM MYOCAR PERF WALL MOTION  03/27/2012   normal   US ECHOCARDIOGRAPHY  09/23/2009   mild-mod LVH,EF =>55%,mild MVP,trace MR,mild to mod TR, AOV mildly sclerotic.   HPI:  Patient is an 83 y.o. male with PMH: dementia, HTN, BPH, CKD stage II, DM-2. He presented to the ER on 11/30/23 after an unwitnessed fall at ALF. He reportedly hit his head and became confused. In ER, patient appeared confused which per daughter was not his baseline level. MRI brain negative; UA negative, x-ray pelvis unremarkable. He was admitted with acute encephalopathy.    Assessment / Plan / Recommendation  Clinical Impression  Patient presents with clinical s/s of dysphagia with SLP suspecting a primary cognitive-based dysphagia. SLP assessed patient's PO toleration of thin liquids (water) and puree solids (applesauce). He was not able to draw liquid through straw but was able to take cup sips of water. (SLP feeding initially but patient able to hold cup and drink later on in session). With liquids, he exhibited multiple (not more than two) swallows per sip and suspected mildly delayed swallow initiation but no overt s/s aspiration. With puree solids, he exhibited poor bilabial closure on spoon, poor awareness to bolus leading to  oral holding and delayed transit. Patient observed to talk at times when applesauce still in mouth. Cues to swallow as well as sips of liquids did help to clear oral residuals. SLP discussed recommendations with patient's RN. Recommend Dys 1(puree) solids, thin liquids with full supervision. SLP will follow to ensure toleration. SLP Visit Diagnosis: Dysphagia, unspecified (R13.10)    Aspiration Risk  Mild aspiration risk    Diet Recommendation Dysphagia 1 (Puree);Thin liquid    Liquid Administration via: Cup;Straw Medication Administration: Crushed with puree Supervision: Full supervision/cueing for compensatory strategies;Staff to assist with self feeding Compensations: Slow rate;Small sips/bites;Minimize environmental distractions;Follow solids with liquid;Other (Comment) (cue to swallow if holding PO's in mouth) Postural Changes: Seated upright at 90 degrees    Other  Recommendations Oral Care Recommendations: Oral care BID    Recommendations for follow up therapy are one component of a multi-disciplinary discharge planning process, led by the attending physician.  Recommendations may be updated based on patient status, additional functional criteria and insurance authorization.  Follow up Recommendations Other (comment) (TBD)      Assistance Recommended at Discharge    Functional Status Assessment Patient has had a recent decline in their functional status and demonstrates the ability to make significant improvements in function in a reasonable and predictable amount of time.  Frequency and Duration min 1 x/week  1 week       Prognosis Prognosis for improved oropharyngeal function: Fair Barriers to Reach Goals: Cognitive deficits      Swallow Study  General Date of Onset: 11/30/23 HPI: Patient is an 83 y.o. male with PMH: dementia, HTN, BPH, CKD stage II, DM-2. He presented to the ER on 11/30/23 after an unwitnessed fall at ALF. He reportedly hit his head and became confused.  In ER, patient appeared confused which per daughter was not his baseline level. MRI brain negative; UA negative, x-ray pelvis unremarkable. He was admitted with acute encephalopathy. Type of Study: Bedside Swallow Evaluation Previous Swallow Assessment: none found Diet Prior to this Study: Dysphagia 3 (mechanical soft);Thin liquids (Level 0) Temperature Spikes Noted: No Respiratory Status: Room air History of Recent Intubation: No Behavior/Cognition: Lethargic/Drowsy;Requires cueing;Cooperative;Pleasant mood;Confused Oral Cavity Assessment: Dried secretions Oral Care Completed by SLP: Yes Oral Cavity - Dentition: Edentulous Self-Feeding Abilities: Needs set up;Needs assist Patient Positioning: Upright in bed Baseline Vocal Quality: Low vocal intensity Volitional Cough: Weak Volitional Swallow: Unable to elicit    Oral/Motor/Sensory Function Overall Oral Motor/Sensory Function: Within functional limits   Ice Chips     Thin Liquid Thin Liquid: Impaired Presentation: Straw;Cup Oral Phase Impairments: Poor awareness of bolus Pharyngeal  Phase Impairments: Multiple swallows;Suspected delayed Swallow    Nectar Thick     Honey Thick     Puree Puree: Impaired Presentation: Spoon Oral Phase Impairments: Poor awareness of bolus;Reduced lingual movement/coordination Oral Phase Functional Implications: Prolonged oral transit;Oral residue;Oral holding   Solid     Solid: Not tested      Angela Nevin, MA, CCC-SLP Speech Therapy

## 2023-12-01 NOTE — Progress Notes (Signed)
 [facility]                                                                               Chase Mccarthy, is a 83 y.o. male, DOB - [birth year], BJY:[id number] Admit date - 11/29/2023    Outpatient Primary MD for the patient is Gwenyth Bender, MD  LOS - 0  days    Brief summary    Chase Mccarthy is a 83 y.o. male with history of dementia, hypertension, BPH, chronic kidney disease stage II, diabetes mellitus type 2 was brought to the ER after patient had an unwitnessed fall at the living facility.    X-ray pelvis chest x-ray unremarkable. Urine drug screen negative.    Patient admitted for acute metabolic encephalopathy . MRI brain is negative for acute stroke.   Assessment & Plan    Assessment and Plan:  Acute metabolic encephalopathy superimposed on underlying dementia: Appears to have improved. He is more alert but restless and confused.  Suspect from dehydration vs worsening of his dementia.  Continue with IV fluids for today.  MRI is negative for acute stroke.  EEG shows diffuse encephalopathy.  No signs of infection so far.  SLP eval recommending dysphagia 1 diet with thin liquid.  UDS IS NEGATIVE  UA is negative.   Stage 2 CKD Creatinine at baseline.  Monitor creatinine.    Type 2 DM; CBG (last 3)  Recent Labs    12/01/23 0718 12/01/23 1136 12/01/23 1614  GLUCAP 101* 110* 112*   Get A1c.  He had 2 episodes of hypoglycemia. Started the patient on IV dextrose fluids.     Anxiety Started him on atarax.    Hypertension:  Uncontrolled BP parameters.  Started him on amlodipine 5 mg daily and added hydralazine prn.   Therapy evaluations ordered.     Estimated body mass index is 17.89 kg/m as calculated from the following:   Height as of this encounter: 6' (1.829 m).   Weight as of this encounter: 59.8 kg.  Code Status:  full code.  DVT Prophylaxis:  enoxaparin (LOVENOX) injection 40 mg Start: 12/01/23 1000   Level of Care: Level of  care: Telemetry Family Communication: none at bedside.   Disposition Plan:     Remains inpatient appropriate:  pending clinical improvement.    Procedures:  EEG.   Consultants:   None.   Antimicrobials:   Anti-infectives (From admission, onward)    None        Medications  Scheduled Meds:  amLODipine  5 mg Oral Daily   dutasteride  0.5 mg Oral Daily   enoxaparin (LOVENOX) injection  40 mg Subcutaneous Daily   Continuous Infusions:  dextrose 5% lactated ringers 75 mL/hr at 11/30/23 1907   PRN Meds:.haloperidol lactate, hydrOXYzine, melatonin    Subjective:   Chase Mccarthy was seen and examined today.  Confused.   Objective:   Vitals:   12/01/23 0216 12/01/23 0430 12/01/23 1100 12/01/23 1149  BP: (!) 166/84 (!) 156/91  (!) 177/98  Pulse: (!) 58   63  Resp: 15 15  18   Temp: 98.2 F (36.8 C) 97.9 F (36.6 C)  98.1 F (36.7 C)  TempSrc:      SpO2: 98% 99%  99%  Weight:   59.8 kg   Height:   6' (1.829 m)     Intake/Output Summary (Last 24 hours) at 12/01/2023 1840 Last data filed at 12/01/2023 1500 Gross per 24 hour  Intake 1509.89 ml  Output 1150 ml  Net 359.89 ml   Filed Weights   12/01/23 1100  Weight: 59.8 kg     Exam General exam: Appears calm and comfortable  Respiratory system: Clear to auscultation. Respiratory effort normal. Cardiovascular system: S1 & S2 heard, RRR. Gastrointestinal system: Abdomen is nondistended, soft and nontender. Central nervous system: Alert and oriented to person only. Grossly non focal. Extremities: Symmetric 5 x 5 power. Skin: No rashes,  Psychiatry: restless, anxious.    Data Reviewed:  I have personally reviewed following labs and imaging studies   CBC Lab Results  Component Value Date   WBC 3.8 (L) 12/01/2023   RBC 4.42 12/01/2023   HGB 13.3 12/01/2023   HCT 40.5 12/01/2023   MCV 91.6 12/01/2023   MCH 30.1 12/01/2023   PLT 171 12/01/2023   MCHC 32.8 12/01/2023   RDW 12.4 12/01/2023   LYMPHSABS  1.1 12/01/2023   MONOABS 0.3 12/01/2023   EOSABS 0.3 12/01/2023   BASOSABS 0.0 12/01/2023     Last metabolic panel Lab Results  Component Value Date   NA 140 12/01/2023   K 3.8 12/01/2023   CL 108 12/01/2023   CO2 26 12/01/2023   BUN 17 12/01/2023   CREATININE 1.23 12/01/2023   GLUCOSE 105 (H) 12/01/2023   GFRNONAA 58 (L) 12/01/2023   CALCIUM 8.9 12/01/2023   PHOS 2.3 (L) 01/19/2023   PROT 6.3 (L) 11/30/2023   ALBUMIN 3.4 (L) 11/30/2023   BILITOT 1.3 (H) 11/30/2023   ALKPHOS 77 11/30/2023   AST 20 11/30/2023   ALT 12 11/30/2023   ANIONGAP 6 12/01/2023    CBG (last 3)  Recent Labs    12/01/23 0718 12/01/23 1136 12/01/23 1614  GLUCAP 101* 110* 112*      Coagulation Profile: No results for input(s): "INR", "PROTIME" in the last 168 hours.   Radiology Studies: EEG adult Result Date: 11/30/2023 Charlsie Quest, MD     11/30/2023 10:02 PM Patient Name: Chase Mccarthy MRN: [id number] Epilepsy Attending: Charlsie Quest Referring Physician/Provider: Eduard Clos, MD Date: 11/30/2023 Duration: 25.04 mins Patient history: 83yo M with ams. EEG to evaluate for seizure Level of alertness: Awake AEDs during EEG study: None Technical aspects: This EEG study was done with scalp electrodes positioned according to the 10-20 International system of electrode placement. Electrical activity was reviewed with band pass filter of 1-70Hz , sensitivity of 7 uV/mm, display speed of 67mm/sec with a 60Hz  notched filter applied as appropriate. EEG data were recorded continuously and digitally stored.  Video monitoring was available and reviewed as appropriate. Description: The posterior dominant rhythm consists of 8 Hz activity of moderate voltage (25-35 uV) seen predominantly in posterior head regions, symmetric and reactive to eye opening and eye closing. EEG showed intermittent generalized 3 to 6 Hz theta-delta slowing. Hyperventilation and photic stimulation were not performed.   ABNORMALITY  - Intermittent slow, generalized IMPRESSION: This study is suggestive of mild diffuse encephalopathy. No seizures or epileptiform discharges were seen throughout the recording. Charlsie Quest   MR BRAIN WO CONTRAST Result Date: 11/30/2023 CLINICAL DATA:  Initial evaluation for acute neuro deficit, stroke suspected. EXAM: MRI HEAD WITHOUT CONTRAST TECHNIQUE: Multiplanar, multiecho pulse sequences  of the brain and surrounding structures were obtained without intravenous contrast. COMPARISON:  CT from 11/29/2023 FINDINGS: Brain: Examination severely degraded by motion artifact. Additionally, patient was unable to tolerate the full length of the exam. Mild age-related cerebral atrophy. No evidence for acute or subacute infarct. No visible areas of chronic cortical infarction. No visible acute or chronic intracranial blood products. No mass lesion, midline shift or mass effect. No hydrocephalus or extra-axial fluid collection. Pituitary gland grossly within normal limits. Vascular: Major intracranial vascular flow voids are grossly maintained at the skull base, although poorly evaluated on this limited exam. Skull and upper cervical spine: Craniocervical junction within normal limits. Bone marrow signal intensity grossly normal. No visible scalp soft tissue abnormality. Sinuses/Orbits: Prior bowel ocular lens replacement. Paranasal sinuses and mastoid air cells appear largely clear. Other: None. IMPRESSION: 1. Severely motion degraded and limited exam. 2. Grossly negative brain MRI. No acute intracranial abnormality. Electronically Signed   By: Rise Mu M.D.   On: 11/30/2023 03:34   DG Chest Portable 1 View Result Date: 11/29/2023 CLINICAL DATA:  Fall, concern for fracture. EXAM: PORTABLE CHEST 1 VIEW, PORTABLE PELVIS COMPARISON:  01/18/2023. FINDINGS: Chest: The heart size and mediastinal contours are within normal limits. There is atherosclerotic calcification of the aorta. No consolidation,  effusion, or pneumothorax is seen. No acute osseous abnormality is identified. Pelvis: No acute fracture or dislocation is seen. Mild degenerative changes are present at the hips bilaterally. Degenerative changes are noted in the lower lumbar spine IMPRESSION: 1. No acute cardiopulmonary process. 2. No acute fracture or dislocation at the pelvis. Electronically Signed   By: Thornell Sartorius M.D.   On: 11/29/2023 20:26   DG Pelvis Portable Result Date: 11/29/2023 CLINICAL DATA:  Fall, concern for fracture. EXAM: PORTABLE CHEST 1 VIEW, PORTABLE PELVIS COMPARISON:  01/18/2023. FINDINGS: Chest: The heart size and mediastinal contours are within normal limits. There is atherosclerotic calcification of the aorta. No consolidation, effusion, or pneumothorax is seen. No acute osseous abnormality is identified. Pelvis: No acute fracture or dislocation is seen. Mild degenerative changes are present at the hips bilaterally. Degenerative changes are noted in the lower lumbar spine IMPRESSION: 1. No acute cardiopulmonary process. 2. No acute fracture or dislocation at the pelvis. Electronically Signed   By: Thornell Sartorius M.D.   On: 11/29/2023 20:26   CT Head Wo Contrast Result Date: 11/29/2023 CLINICAL DATA:  Increased falls and dysphagia. EXAM: CT HEAD WITHOUT CONTRAST TECHNIQUE: Contiguous axial images were obtained from the base of the skull through the vertex without intravenous contrast. RADIATION DOSE REDUCTION: This exam was performed according to the departmental dose-optimization program which includes automated exposure control, adjustment of the mA and/or kV according to patient size and/or use of iterative reconstruction technique. COMPARISON:  Jan 18, 2023 FINDINGS: Brain: There is mild cerebral atrophy with widening of the extra-axial spaces and ventricular dilatation. There are areas of decreased attenuation within the white matter tracts of the supratentorial brain, consistent with microvascular disease  changes. Vascular: Mild to moderate severity bilateral cavernous carotid artery calcification is noted. Skull: Normal. Negative for fracture or focal lesion. Sinuses/Orbits: No acute finding. Other: None. IMPRESSION: 1. No acute intracranial abnormality. 2. Generalized cerebral atrophy and microvascular disease changes of the supratentorial brain. Electronically Signed   By: Aram Candela M.D.   On: 11/29/2023 19:20       Kathlen Mody M.D. [facility] 12/01/2023, 6:40 PM  Available via Epic secure chat 7am-7pm After 7 pm, please refer to night  coverage provider listed on amion.

## 2023-12-01 NOTE — Care Management Obs Status (Signed)
 MEDICARE OBSERVATION STATUS NOTIFICATION   Patient Details  Name: Chase Mccarthy MRN: [id number] Date of Birth: 24-Feb-1941   Medicare Observation Status Notification Given:  Yes    Larrie Kass, LCSW 12/01/2023, 9:19 AM

## 2023-12-01 NOTE — Plan of Care (Signed)
  Problem: Education: Goal: Knowledge of General Education information will improve Description: Including pain rating scale, medication(s)/side effects and non-pharmacologic comfort measures 12/01/2023 0901 by Charmayne Sheer, LPN Outcome: Progressing 12/01/2023 0901 by Charmayne Sheer, LPN Outcome: Progressing   Problem: Health Behavior/Discharge Planning: Goal: Ability to manage health-related needs will improve 12/01/2023 0901 by Charmayne Sheer, LPN Outcome: Progressing 12/01/2023 0901 by Charmayne Sheer, LPN Outcome: Progressing   Problem: Clinical Measurements: Goal: Ability to maintain clinical measurements within normal limits will improve 12/01/2023 0901 by Charmayne Sheer, LPN Outcome: Progressing 12/01/2023 0901 by Charmayne Sheer, LPN Outcome: Progressing Goal: Will remain free from infection 12/01/2023 0901 by Charmayne Sheer, LPN Outcome: Progressing 12/01/2023 0901 by Charmayne Sheer, LPN Outcome: Progressing Goal: Diagnostic test results will improve 12/01/2023 0901 by Charmayne Sheer, LPN Outcome: Progressing 12/01/2023 0901 by Charmayne Sheer, LPN Outcome: Progressing Goal: Respiratory complications will improve Outcome: Progressing Goal: Cardiovascular complication will be avoided Outcome: Progressing   Problem: Activity: Goal: Risk for activity intolerance will decrease Outcome: Progressing   Problem: Nutrition: Goal: Adequate nutrition will be maintained Outcome: Progressing   Problem: Coping: Goal: Level of anxiety will decrease Outcome: Progressing   Problem: Elimination: Goal: Will not experience complications related to bowel motility Outcome: Progressing Goal: Will not experience complications related to urinary retention Outcome: Progressing   Problem: Pain Managment: Goal: General experience of comfort will improve and/or be controlled Outcome: Progressing   Problem: Safety: Goal: Ability to remain free from injury will improve Outcome: Progressing    Problem: Skin Integrity: Goal: Risk for impaired skin integrity will decrease Outcome: Progressing   Problem: Education: Goal: Individualized Educational Video(s) Outcome: Progressing   Problem: Metabolic: Goal: Ability to maintain appropriate glucose levels will improve Outcome: Progressing   Problem: Nutritional: Goal: Maintenance of adequate nutrition will improve Outcome: Progressing

## 2023-12-02 DIAGNOSIS — G9340 Encephalopathy, unspecified: Secondary | ICD-10-CM | POA: Diagnosis not present

## 2023-12-02 DIAGNOSIS — I10 Essential (primary) hypertension: Secondary | ICD-10-CM | POA: Diagnosis not present

## 2023-12-02 DIAGNOSIS — N289 Disorder of kidney and ureter, unspecified: Secondary | ICD-10-CM | POA: Diagnosis not present

## 2023-12-02 DIAGNOSIS — E119 Type 2 diabetes mellitus without complications: Secondary | ICD-10-CM | POA: Diagnosis not present

## 2023-12-02 DIAGNOSIS — N1831 Chronic kidney disease, stage 3a: Secondary | ICD-10-CM | POA: Diagnosis not present

## 2023-12-02 LAB — GLUCOSE, CAPILLARY
Glucose-Capillary: 102 mg/dL — ABNORMAL HIGH (ref 70–99)
Glucose-Capillary: 110 mg/dL — ABNORMAL HIGH (ref 70–99)
Glucose-Capillary: 118 mg/dL — ABNORMAL HIGH (ref 70–99)
Glucose-Capillary: 85 mg/dL (ref 70–99)
Glucose-Capillary: 91 mg/dL (ref 70–99)
Glucose-Capillary: 91 mg/dL (ref 70–99)
Glucose-Capillary: 99 mg/dL (ref 70–99)

## 2023-12-02 MED ORDER — DEXTROSE IN LACTATED RINGERS 5 % IV SOLN: INTRAVENOUS | Status: AC

## 2023-12-02 MED ORDER — OLANZAPINE 2.5 MG PO TABS
2.5000 mg | ORAL_TABLET | Freq: Every day | ORAL | Status: DC
  Administered 2023-12-02 – 2023-12-03 (×2): 2.5 mg via ORAL
  Filled 2023-12-02 (×2): qty 1

## 2023-12-02 NOTE — Progress Notes (Signed)
 [facility]                                                                               Chase Mccarthy, is a 83 y.o. male, DOB - April 04, 1941, ZOX:[id number] Admit date - 11/29/2023    Outpatient Primary MD for the patient is Gwenyth Bender, MD  LOS - 0  days    Brief summary    Chase Mccarthy is a 83 y.o. male with history of dementia, hypertension, BPH, chronic kidney disease stage II, diabetes mellitus type 2 was brought to the ER after patient had an unwitnessed fall at the living facility.    X-ray pelvis chest x-ray unremarkable. Urine drug screen negative.    Patient admitted for acute metabolic encephalopathy . MRI brain is negative for acute stroke.   Assessment & Plan    Assessment and Plan:  Acute metabolic encephalopathy superimposed on underlying dementia: Suspect from dehydration vs worsening of his dementia.  He remains confused. Unclear etiology.  Continue with IV fluids for today.  MRI is negative for acute stroke.  EEG shows diffuse encephalopathy.  No signs of infection so far.  SLP eval recommending dysphagia 1 diet with thin liquid.  UDS IS NEGATIVE  UA is negative.  Will add zyprexa this evening.  Stage 2 CKD Creatinine at baseline.  Monitor.    Type 2 diabetes mellitus, non insulin dependent.  CBG (last 3)  Recent Labs    12/02/23 0516 12/02/23 0745 12/02/23 1157  GLUCAP 85 99 118*   Get A1c.  He had 2 episodes of hypoglycemia on admission. Started the patient on IV dextrose fluids.  Cbg's are improving.    Anxiety Started him on atarax.    Hypertension:  BP parameters are slowly improving.  Started the patient on amlodipine. Prn hydralazine.  Therapy evaluations ordered.  Patient's daughter is requesting for different SNF to be discharged.     Estimated body mass index is 17.89 kg/m as calculated from the following:   Height as of this encounter: 6' (1.829 m).   Weight as of this encounter: 59.8 kg.  Code  Status:  full code.  DVT Prophylaxis:  enoxaparin (LOVENOX) injection 40 mg Start: 12/01/23 1000   Level of Care: Level of care: Telemetry Family Communication: none at bedside.   Disposition Plan:     Remains inpatient appropriate:  pending clinical improvement.    Procedures:  EEG.   Consultants:   None.   Antimicrobials:   Anti-infectives (From admission, onward)    None        Medications  Scheduled Meds:  amLODipine  5 mg Oral Daily   dutasteride  0.5 mg Oral Daily   enoxaparin (LOVENOX) injection  40 mg Subcutaneous Daily   Continuous Infusions:  dextrose 5% lactated ringers     PRN Meds:.haloperidol lactate, hydrOXYzine, melatonin    Subjective:   Chase Mccarthy was seen and examined today.  Remains to be confused.   Objective:   Vitals:   12/02/23 0514 12/02/23 0803 12/02/23 0856 12/02/23 1243  BP: (!) 157/80 (!) 161/92 (!) 161/92 (!) 143/89  Pulse: 67 67  72  Resp: 16 20  20  Temp: 98.2 F (36.8 C) 98.1 F (36.7 C)  97.9 F (36.6 C)  TempSrc:  Oral  Oral  SpO2: 99% 100%  100%  Weight:      Height:        Intake/Output Summary (Last 24 hours) at 12/02/2023 1316 Last data filed at 12/02/2023 1111 Gross per 24 hour  Intake 2729.37 ml  Output 450 ml  Net 2279.37 ml   Filed Weights   12/01/23 1100  Weight: 59.8 kg     Exam General exam: Appears calm and comfortable  Respiratory system: Clear to auscultation. Respiratory effort normal. Cardiovascular system: S1 & S2 heard, RRR.  Gastrointestinal system: Abdomen is nondistended, soft and nontender. Central nervous system: Alert and oriented to person. Extremities: warm extremities, no pedal edema Skin: No rashes,  Psychiatry: confused .     Data Reviewed:  I have personally reviewed following labs and imaging studies   CBC Lab Results  Component Value Date   WBC 3.8 (L) 12/01/2023   RBC 4.42 12/01/2023   HGB 13.3 12/01/2023   HCT 40.5 12/01/2023   MCV 91.6 12/01/2023   MCH  30.1 12/01/2023   PLT 171 12/01/2023   MCHC 32.8 12/01/2023   RDW 12.4 12/01/2023   LYMPHSABS 1.1 12/01/2023   MONOABS 0.3 12/01/2023   EOSABS 0.3 12/01/2023   BASOSABS 0.0 12/01/2023     Last metabolic panel Lab Results  Component Value Date   NA 140 12/01/2023   K 3.8 12/01/2023   CL 108 12/01/2023   CO2 26 12/01/2023   BUN 17 12/01/2023   CREATININE 1.23 12/01/2023   GLUCOSE 105 (H) 12/01/2023   GFRNONAA 58 (L) 12/01/2023   CALCIUM 8.9 12/01/2023   PHOS 2.3 (L) 01/19/2023   PROT 6.3 (L) 11/30/2023   ALBUMIN 3.4 (L) 11/30/2023   BILITOT 1.3 (H) 11/30/2023   ALKPHOS 77 11/30/2023   AST 20 11/30/2023   ALT 12 11/30/2023   ANIONGAP 6 12/01/2023    CBG (last 3)  Recent Labs    12/02/23 0516 12/02/23 0745 12/02/23 1157  GLUCAP 85 99 118*      Coagulation Profile: No results for input(s): "INR", "PROTIME" in the last 168 hours.   Radiology Studies: EEG adult Result Date: 11/30/2023 Charlsie Quest, MD     11/30/2023 10:02 PM Patient Name: PETRA SARGEANT MRN: [id number] Epilepsy Attending: Charlsie Quest Referring Physician/Provider: Eduard Clos, MD Date: 11/30/2023 Duration: 25.04 mins Patient history: 82yo M with ams. EEG to evaluate for seizure Level of alertness: Awake AEDs during EEG study: None Technical aspects: This EEG study was done with scalp electrodes positioned according to the 10-20 International system of electrode placement. Electrical activity was reviewed with band pass filter of 1-70Hz , sensitivity of 7 uV/mm, display speed of 22mm/sec with a 60Hz  notched filter applied as appropriate. EEG data were recorded continuously and digitally stored.  Video monitoring was available and reviewed as appropriate. Description: The posterior dominant rhythm consists of 8 Hz activity of moderate voltage (25-35 uV) seen predominantly in posterior head regions, symmetric and reactive to eye opening and eye closing. EEG showed intermittent generalized 3 to 6 Hz  theta-delta slowing. Hyperventilation and photic stimulation were not performed.   ABNORMALITY - Intermittent slow, generalized IMPRESSION: This study is suggestive of mild diffuse encephalopathy. No seizures or epileptiform discharges were seen throughout the recording. Priyanka Louie Boston M.D. [facility] 12/02/2023, 1:16 PM  Available via Epic secure chat  7am-7pm After 7 pm, please refer to night coverage provider listed on amion.

## 2023-12-02 NOTE — Plan of Care (Signed)
  Problem: Education: Goal: Knowledge of General Education information will improve Description: Including pain rating scale, medication(s)/side effects and non-pharmacologic comfort measures Outcome: Progressing   Problem: Clinical Measurements: Goal: Diagnostic test results will improve Outcome: Progressing   Problem: Nutrition: Goal: Adequate nutrition will be maintained Outcome: Progressing   Problem: Coping: Goal: Level of anxiety will decrease Outcome: Progressing   Problem: Elimination: Goal: Will not experience complications related to bowel motility Outcome: Progressing   Problem: Nutritional: Goal: Maintenance of adequate nutrition will improve Outcome: Progressing   Problem: Safety: Goal: Non-violent Restraint(s) Outcome: Progressing

## 2023-12-02 NOTE — Progress Notes (Signed)
 Patient is still agitated and getting out of the bed frequently during this shift. Patient was placed on mittens for safety as he pulled IV line. Patient still have waist restraint. Family member - POA was updated. POA would like to have a phone call from hospitalist for update in the morning.

## 2023-12-02 NOTE — Progress Notes (Signed)
 OT Cancellation Note  Patient Details Name: Chase Mccarthy MRN: [id number] DOB: Mar 21, 1941   Cancelled Treatment:    Reason Eval/Treat Not Completed: OT screened, no needs identified, will sign off  OT screened, no needs identified, will sign off Pt from LTC SNF per TOC notes.  Does not appear appropriate for acute OT.  OT to sign off.    Alba Cory 12/02/2023, 9:42 AM

## 2023-12-02 NOTE — Progress Notes (Signed)
 PT Cancellation Note  Patient Details Name: Chase Mccarthy MRN: [id number] DOB: 03-03-1941   Cancelled Treatment:    Reason Eval/Treat Not Completed: PT screened, no needs identified, will sign off Pt from LTC SNF per TOC notes.  Does not appear appropriate for acute PT.  PT to sign off.   [name] 12/02/2023, 9:20 AM Paulino Door, DPT Physical Therapist Acute Rehabilitation Services Office: [phone]

## 2023-12-02 NOTE — Plan of Care (Signed)
  Problem: Safety: Goal: Ability to remain free from injury will improve Outcome: Progressing   Problem: Pain Managment: Goal: General experience of comfort will improve and/or be controlled Outcome: Progressing   Problem: Nutrition: Goal: Adequate nutrition will be maintained Outcome: Progressing

## 2023-12-03 DIAGNOSIS — N289 Disorder of kidney and ureter, unspecified: Secondary | ICD-10-CM | POA: Diagnosis not present

## 2023-12-03 DIAGNOSIS — I10 Essential (primary) hypertension: Secondary | ICD-10-CM | POA: Diagnosis not present

## 2023-12-03 DIAGNOSIS — N1831 Chronic kidney disease, stage 3a: Secondary | ICD-10-CM | POA: Diagnosis not present

## 2023-12-03 DIAGNOSIS — G9340 Encephalopathy, unspecified: Secondary | ICD-10-CM | POA: Diagnosis not present

## 2023-12-03 DIAGNOSIS — E119 Type 2 diabetes mellitus without complications: Secondary | ICD-10-CM | POA: Diagnosis not present

## 2023-12-03 LAB — CBC WITH DIFFERENTIAL/PLATELET
Abs Immature Granulocytes: 0.01 10*3/uL (ref 0.00–0.07)
Basophils Absolute: 0.1 10*3/uL (ref 0.0–0.1)
Basophils Relative: 2 %
Eosinophils Absolute: 0.3 10*3/uL (ref 0.0–0.5)
Eosinophils Relative: 8 %
HCT: 42.6 % (ref 39.0–52.0)
Hemoglobin: 14.1 g/dL (ref 13.0–17.0)
Immature Granulocytes: 0 %
Lymphocytes Relative: 29 %
Lymphs Abs: 1.3 10*3/uL (ref 0.7–4.0)
MCH: 30.4 pg (ref 26.0–34.0)
MCHC: 33.1 g/dL (ref 30.0–36.0)
MCV: 91.8 fL (ref 80.0–100.0)
Monocytes Absolute: 0.4 10*3/uL (ref 0.1–1.0)
Monocytes Relative: 9 %
Neutro Abs: 2.4 10*3/uL (ref 1.7–7.7)
Neutrophils Relative %: 52 %
Platelets: 162 10*3/uL (ref 150–400)
RBC: 4.64 MIL/uL (ref 4.22–5.81)
RDW: 12.6 % (ref 11.5–15.5)
WBC: 4.5 10*3/uL (ref 4.0–10.5)
nRBC: 0 % (ref 0.0–0.2)

## 2023-12-03 LAB — GLUCOSE, CAPILLARY
Glucose-Capillary: 114 mg/dL — ABNORMAL HIGH (ref 70–99)
Glucose-Capillary: 115 mg/dL — ABNORMAL HIGH (ref 70–99)
Glucose-Capillary: 119 mg/dL — ABNORMAL HIGH (ref 70–99)
Glucose-Capillary: 120 mg/dL — ABNORMAL HIGH (ref 70–99)
Glucose-Capillary: 127 mg/dL — ABNORMAL HIGH (ref 70–99)
Glucose-Capillary: 87 mg/dL (ref 70–99)
Glucose-Capillary: 98 mg/dL (ref 70–99)

## 2023-12-03 LAB — BASIC METABOLIC PANEL
Anion gap: 6 (ref 5–15)
BUN: 13 mg/dL (ref 8–23)
CO2: 27 mmol/L (ref 22–32)
Calcium: 9 mg/dL (ref 8.9–10.3)
Chloride: 107 mmol/L (ref 98–111)
Creatinine, Ser: 1.25 mg/dL — ABNORMAL HIGH (ref 0.61–1.24)
GFR, Estimated: 57 mL/min — ABNORMAL LOW (ref >60)
Glucose, Bld: 102 mg/dL — ABNORMAL HIGH (ref 70–99)
Potassium: 4.1 mmol/L (ref 3.5–5.1)
Sodium: 140 mmol/L (ref 135–145)

## 2023-12-03 LAB — HEMOGLOBIN A1C
Hgb A1c MFr Bld: 5.6 % (ref 4.8–5.6)
Mean Plasma Glucose: 114 mg/dL

## 2023-12-03 MED ORDER — ACETAMINOPHEN 325 MG PO TABS
650.0000 mg | ORAL_TABLET | ORAL | Status: DC | PRN
  Administered 2023-12-04: 650 mg via ORAL
  Filled 2023-12-03: qty 2

## 2023-12-03 MED ORDER — OLANZAPINE 2.5 MG PO TABS
1.2500 mg | ORAL_TABLET | Freq: Every day | ORAL | Status: DC
  Administered 2023-12-04 – 2023-12-05 (×2): 1.25 mg via ORAL
  Filled 2023-12-03 (×2): qty 0.5

## 2023-12-03 MED ORDER — DIVALPROEX SODIUM 125 MG PO CSDR
125.0000 mg | DELAYED_RELEASE_CAPSULE | Freq: Every day | ORAL | Status: DC
  Administered 2023-12-03 – 2023-12-05 (×3): 125 mg via ORAL
  Filled 2023-12-03 (×3): qty 1

## 2023-12-03 MED ORDER — METHOCARBAMOL 500 MG PO TABS: 500.0000 mg | ORAL_TABLET | Freq: Three times a day (TID) | ORAL | Status: DC | PRN

## 2023-12-03 NOTE — Progress Notes (Signed)
 Speech Language Pathology Treatment: Dysphagia  Patient Details Name: TAYVEN RENTERIA MRN: [id number] DOB: 16-Jan-1941 Today's Date: 12/03/2023 Time: 4098-1191 SLP Time Calculation (min) (ACUTE ONLY): 18 min  Assessment / Plan / Recommendation Clinical Impression  Patient seen by SLP for skilled treatment focused on dysphagia goals. Per RN, he has been sleeping all morning secondary to agitation previous date. When SLP arrived into room, patient drowsy but opened eyes and interacted with SLP. (Presentation similar to when this SLP evaluated him on 3/22). Lips were dry but oral mucosa appeared moist and clean with no secretions observed. Patient accepted small, controlled cup sips of thin liquids without overt s/s aspiration but with audible, gulping swallow. He was not able to effectively masticate small pieces of gelatin, resulting in PO residuals remaining on tongue. No observed PO residuals with puree (applesauce). Sips of liquid helped to clear oral cavity. SLP recommending to continue on current diet (Dys 1 (puree) solids, thin liquids) and will plan to f/u at least one more time to ensure he is on the least restrictive oral diet.   HPI HPI: Patient is an 83 y.o. male with PMH: dementia, HTN, BPH, CKD stage II, DM-2. He presented to the ER on 11/30/23 after an unwitnessed fall at ALF. He reportedly hit his head and became confused. In ER, patient appeared confused which per daughter was not his baseline level. MRI brain negative; UA negative, x-ray pelvis unremarkable. He was admitted with acute encephalopathy.      SLP Plan  Continue with current plan of care      Recommendations for follow up therapy are one component of a multi-disciplinary discharge planning process, led by the attending physician.  Recommendations may be updated based on patient status, additional functional criteria and insurance authorization.    Recommendations  Diet recommendations: Dysphagia 1 (puree);Thin  liquid Liquids provided via: Cup Medication Administration: Crushed with puree Supervision: Trained caregiver to feed patient;Full supervision/cueing for compensatory strategies Compensations: Slow rate;Small sips/bites;Minimize environmental distractions;Follow solids with liquid;Other (Comment) Postural Changes and/or Swallow Maneuvers: Seated upright 90 degrees                  Oral care BID;Oral care before and after PO   Frequent or constant Supervision/Assistance Dysphagia, unspecified (R13.10)     Continue with current plan of care     Angela Nevin, MA, CCC-SLP Speech Therapy

## 2023-12-03 NOTE — Plan of Care (Signed)
  Problem: Education: Goal: Knowledge of General Education information will improve Description: Including pain rating scale, medication(s)/side effects and non-pharmacologic comfort measures Outcome: Progressing   Problem: Health Behavior/Discharge Planning: Goal: Ability to manage health-related needs will improve Outcome: Progressing   Problem: Clinical Measurements: Goal: Ability to maintain clinical measurements within normal limits will improve Outcome: Progressing Goal: Will remain free from infection Outcome: Progressing Goal: Diagnostic test results will improve Outcome: Progressing Goal: Respiratory complications will improve Outcome: Progressing Goal: Cardiovascular complication will be avoided Outcome: Progressing   Problem: Activity: Goal: Risk for activity intolerance will decrease Outcome: Progressing   Problem: Nutrition: Goal: Adequate nutrition will be maintained Outcome: Progressing   Problem: Coping: Goal: Level of anxiety will decrease Outcome: Progressing   Problem: Elimination: Goal: Will not experience complications related to bowel motility Outcome: Progressing Goal: Will not experience complications related to urinary retention Outcome: Progressing   Problem: Pain Managment: Goal: General experience of comfort will improve and/or be controlled Outcome: Progressing   Problem: Safety: Goal: Ability to remain free from injury will improve Outcome: Progressing   Problem: Skin Integrity: Goal: Risk for impaired skin integrity will decrease Outcome: Progressing   Problem: Education: Goal: Individualized Educational Video(s) Outcome: Progressing   Problem: Metabolic: Goal: Ability to maintain appropriate glucose levels will improve Outcome: Progressing   Problem: Nutritional: Goal: Maintenance of adequate nutrition will improve Outcome: Progressing   Problem: Safety: Goal: Non-violent Restraint(s) Outcome: Progressing

## 2023-12-03 NOTE — Plan of Care (Signed)

## 2023-12-03 NOTE — Progress Notes (Signed)
 [facility]                                                                               Chase Mccarthy, is a 83 y.o. male, DOB - Jan 23, 1941, ZOX:[id number] Admit date - 11/29/2023    Outpatient Primary MD for the patient is Gwenyth Bender, MD  LOS - 0  days    Brief summary    Chase Mccarthy is a 83 y.o. male with history of dementia, hypertension, BPH, chronic kidney disease stage II, diabetes mellitus type 2 was brought to the ER after patient had an unwitnessed fall at the living facility.    X-ray pelvis chest x-ray unremarkable. Urine drug screen negative.    Patient admitted for acute metabolic encephalopathy . MRI brain is negative for acute stroke.   Assessment & Plan    Assessment and Plan:  Acute metabolic encephalopathy superimposed on underlying dementia: Suspect from dehydration vs worsening of his dementia.  He remains confused. Unclear etiology.  Continue with IV fluids for today.  MRI is negative for acute stroke.  EEG shows diffuse encephalopathy.  No signs of infection so far.  SLP eval recommending dysphagia 1 diet with thin liquid.  UDS IS NEGATIVE  UA is negative.  Zyprexa helped with agitation, he is more calm and conversing.  He ate about 50% of his meals as per RN.   Stage 2 CKD Creatinine at baseline.  Monitor.    Type 2 diabetes mellitus, non insulin dependent.  CBG (last 3)  Recent Labs    12/03/23 0731 12/03/23 1210 12/03/23 1621  GLUCAP 87 115* 114*   A1c is pending.  He had 2 episodes of hypoglycemia on admission. Started the patient on IV dextrose fluids.  Cbg's are improving.    Anxiety Started him on atarax.   Neck pain: tylenol ordered.    Hypertension:  BP parameters are slowly improving.  Started the patient on amlodipine. Prn hydralazine.  Therapy evaluations ordered.  Patient's daughter would like to take him home.      Estimated body mass index is 17.89 kg/m as calculated from the following:    Height as of this encounter: 6' (1.829 m).   Weight as of this encounter: 59.8 kg.  Code Status:  full code.  DVT Prophylaxis:  enoxaparin (LOVENOX) injection 40 mg Start: 12/01/23 1000   Level of Care: Level of care: Telemetry Family Communication: discussed the plan with daughter at bedside.   Disposition Plan:     Remains inpatient appropriate:  pending clinical improvement.    Procedures:  EEG.   Consultants:   None.   Antimicrobials:   Anti-infectives (From admission, onward)    None        Medications  Scheduled Meds:  amLODipine  5 mg Oral Daily   divalproex  125 mg Oral QHS   dutasteride  0.5 mg Oral Daily   enoxaparin (LOVENOX) injection  40 mg Subcutaneous Daily   [START ON 12/04/2023] OLANZapine  1.25 mg Oral Daily   Continuous Infusions:   PRN Meds:.acetaminophen, haloperidol lactate, hydrOXYzine, melatonin, methocarbamol    Subjective:   Chase Mccarthy was seen and examined today.  Calmer and  appears to be back to baseline. Daughter at bedside, says he has neck pain.   Objective:   Vitals:   12/02/23 1243 12/02/23 1921 12/03/23 0400 12/03/23 1212  BP: (!) 143/89 (!) 146/83 (!) 147/72 (!) 141/89  Pulse: 72  65 66  Resp: 20 18 16    Temp: 97.9 F (36.6 C) 97.7 F (36.5 C) 97.9 F (36.6 C) (!) 97.4 F (36.3 C)  TempSrc: Oral     SpO2: 100% 90%  99%  Weight:      Height:        Intake/Output Summary (Last 24 hours) at 12/03/2023 1741 Last data filed at 12/03/2023 1300 Gross per 24 hour  Intake 120 ml  Output 500 ml  Net -380 ml   Filed Weights   12/01/23 1100  Weight: 59.8 kg     Exam General exam: Appears calm and comfortable  Respiratory system: Clear to auscultation. Respiratory effort normal. Cardiovascular system: S1 & S2 heard, RRR. No JVD, Gastrointestinal system: Abdomen is nondistended, soft and nontender.  Central nervous system: Alert and oriented to person , able to follow  simple commands. Able to move all extremities.   Extremities: no pedal edema.  Skin: No rashes,  Psychiatry: Mood & affect appropriate.      Data Reviewed:  I have personally reviewed following labs and imaging studies   CBC Lab Results  Component Value Date   WBC 3.8 (L) 12/01/2023   RBC 4.42 12/01/2023   HGB 13.3 12/01/2023   HCT 40.5 12/01/2023   MCV 91.6 12/01/2023   MCH 30.1 12/01/2023   PLT 171 12/01/2023   MCHC 32.8 12/01/2023   RDW 12.4 12/01/2023   LYMPHSABS 1.1 12/01/2023   MONOABS 0.3 12/01/2023   EOSABS 0.3 12/01/2023   BASOSABS 0.0 12/01/2023     Last metabolic panel Lab Results  Component Value Date   NA 140 12/01/2023   K 3.8 12/01/2023   CL 108 12/01/2023   CO2 26 12/01/2023   BUN 17 12/01/2023   CREATININE 1.23 12/01/2023   GLUCOSE 105 (H) 12/01/2023   GFRNONAA 58 (L) 12/01/2023   CALCIUM 8.9 12/01/2023   PHOS 2.3 (L) 01/19/2023   PROT 6.3 (L) 11/30/2023   ALBUMIN 3.4 (L) 11/30/2023   BILITOT 1.3 (H) 11/30/2023   ALKPHOS 77 11/30/2023   AST 20 11/30/2023   ALT 12 11/30/2023   ANIONGAP 6 12/01/2023    CBG (last 3)  Recent Labs    12/03/23 0731 12/03/23 1210 12/03/23 1621  GLUCAP 87 115* 114*      Coagulation Profile: No results for input(s): "INR", "PROTIME" in the last 168 hours.   Radiology Studies: No results found.      Kathlen Mody M.D. [facility] 12/03/2023, 5:41 PM  Available via Epic secure chat 7am-7pm After 7 pm, please refer to night coverage provider listed on amion.

## 2023-12-03 NOTE — TOC Progression Note (Signed)
 Transition of Care [facility]) - Progression Note    Patient Details  Name: Chase Mccarthy MRN: [id number] Date of Birth: 06/14/1941  [facility]) CM/SW Contact  Otelia Santee, LCSW Phone Number: 12/03/2023, 1:03 PM  Clinical Narrative:    Spoke with pt's daughter via t/c at daughter request. Per daughter she does not want him to return to his LTC facility. CSW explained to daughter that at discharge pt would either have to return to his current LTC facility or would have to discharge home with her while she seeks alternate placement. Pt daughter shares that she will take pt home as he is not returning to [facility].  Pt's daughter also inquired if pt had been changed to inpatient status. CSW explained that pt remains under observation status as he does not meet inpatient criteria. Pt's daughter requesting to speak to someone who can explain why patient is coded the way he is. Message sent to UR to give daughter a call.    Expected Discharge Plan: Long Term Nursing Home Barriers to Discharge: Continued Medical Work up  Expected Discharge Plan and Services       Living arrangements for the past 2 months: Skilled Nursing Facility                                       Social Determinants of Health (SDOH) Interventions SDOH Screenings   Food Insecurity: Patient Unable To Answer (12/01/2023)  Housing: Patient Unable To Answer (12/01/2023)  Transportation Needs: Patient Unable To Answer (12/01/2023)  Utilities: Patient Unable To Answer (12/01/2023)  Social Connections: Patient Unable To Answer (12/01/2023)  Tobacco Use: High Risk (11/29/2023)    Readmission Risk Interventions     No data to display

## 2023-12-03 NOTE — NC FL2 (Signed)
 [place] MEDICAID FL2 LEVEL OF CARE FORM     IDENTIFICATION  Patient Name: Chase Mccarthy Birthdate: May 11, 1941 Sex: male Admission Date (Current Location): 11/29/2023  [facility] and [place] Number:  Producer, television/film/video and Address:  [facility],  [address] [place], [place] [address]      Provider Number: [id number]  Attending Physician Name and Address:  Kathlen Mody, MD  Relative Name and Phone Number:  Holley Bouche (Daughter)  [phone]    Current Level of Care: Hospital Recommended Level of Care: Nursing Facility Prior Approval Number:    Date Approved/Denied:   PASRR Number:    Discharge Plan: SNF    Current Diagnoses: Patient Active Problem List   Diagnosis Date Noted   Acute encephalopathy 11/30/2023   CKD (chronic kidney disease) stage 3, GFR 30-59 ml/min (HCC) 11/30/2023   Protein-calorie malnutrition, severe 01/19/2023   AKI (acute kidney injury) (HCC) 01/18/2023   Hypertension    Diabetes mellitus (HCC)    Restrictive lung disease    Onychomycosis    Essential hypertension 01/13/2011   Diabetes mellitus type 2 in nonobese (HCC) 01/13/2011   Vitamin D deficiency 08/17/2009   Benign prostatic hyperplasia with urinary obstruction 01/13/2004   Hyperlipidemia with target LDL less than 100 10/28/1999   Tobacco abuse 10/15/1992    Orientation RESPIRATION BLADDER Height & Weight     Self  Normal Incontinent Weight: 131 lb 14.4 oz (59.8 kg) Height:  6' (182.9 cm)  BEHAVIORAL SYMPTOMS/MOOD NEUROLOGICAL BOWEL NUTRITION STATUS      Continent Diet (See discharge summary)  AMBULATORY STATUS COMMUNICATION OF NEEDS Skin   Limited Assist Verbally Normal                       Personal Care Assistance Level of Assistance  Bathing, Feeding, Dressing Bathing Assistance: Limited assistance Feeding assistance: Limited assistance Dressing Assistance: Limited assistance     Functional Limitations Info  Sight, Hearing, Speech Sight Info:  Impaired Hearing Info: Adequate Speech Info: Adequate    SPECIAL CARE FACTORS FREQUENCY                       Contractures Contractures Info: Not present    Additional Factors Info  Code Status, Allergies, Psychotropic Code Status Info: FULL Allergies Info: No Known Allergies Psychotropic Info: Zyprexa         Current Medications (12/03/2023):  This is the current hospital active medication list Current Facility-Administered Medications  Medication Dose Route Frequency Provider Last Rate Last Admin   amLODipine (NORVASC) tablet 5 mg  5 mg Oral Daily Eduard Clos, MD   5 mg at 12/03/23 1030   dutasteride (AVODART) capsule 0.5 mg  0.5 mg Oral Daily Eduard Clos, MD   0.5 mg at 12/03/23 1030   enoxaparin (LOVENOX) injection 40 mg  40 mg Subcutaneous Daily Kathlen Mody, MD   40 mg at 12/03/23 1030   haloperidol lactate (HALDOL) injection 1 mg  1 mg Intramuscular Q6H PRN Kathlen Mody, MD       hydrOXYzine (ATARAX) tablet 25 mg  25 mg Oral TID PRN Kathlen Mody, MD   25 mg at 12/02/23 2310   melatonin tablet 3 mg  3 mg Oral QHS PRN Eduard Clos, MD   3 mg at 12/01/23 2106   OLANZapine (ZYPREXA) tablet 2.5 mg  2.5 mg Oral Daily Kathlen Mody, MD   2.5 mg at 12/03/23 1030     Discharge Medications:  Please see discharge summary for a list of discharge medications.  Relevant Imaging Results:  Relevant Lab Results:   Additional Information SS# [id number]  Otelia Santee, LCSW

## 2023-12-04 DIAGNOSIS — N1831 Chronic kidney disease, stage 3a: Secondary | ICD-10-CM | POA: Diagnosis not present

## 2023-12-04 DIAGNOSIS — N289 Disorder of kidney and ureter, unspecified: Secondary | ICD-10-CM | POA: Diagnosis not present

## 2023-12-04 DIAGNOSIS — G9340 Encephalopathy, unspecified: Secondary | ICD-10-CM | POA: Diagnosis not present

## 2023-12-04 DIAGNOSIS — E119 Type 2 diabetes mellitus without complications: Secondary | ICD-10-CM | POA: Diagnosis not present

## 2023-12-04 DIAGNOSIS — I10 Essential (primary) hypertension: Secondary | ICD-10-CM | POA: Diagnosis not present

## 2023-12-04 LAB — GLUCOSE, CAPILLARY
Glucose-Capillary: 93 mg/dL (ref 70–99)
Glucose-Capillary: 85 mg/dL (ref 70–99)
Glucose-Capillary: 119 mg/dL — ABNORMAL HIGH (ref 70–99)
Glucose-Capillary: 102 mg/dL — ABNORMAL HIGH (ref 70–99)
Glucose-Capillary: 154 mg/dL — ABNORMAL HIGH (ref 70–99)

## 2023-12-04 NOTE — Evaluation (Addendum)
 Physical Therapy Evaluation Patient Details Name: Chase Mccarthy MRN: [id number] DOB: [birth year] Today's Date: 12/04/2023  History of Present Illness  Chase Mccarthy is a 83 y.o. male comes to ED after unwitnessed fall at the SNF, negative fractures, negative for stroke, Positive for acut encephalopathy..PMH:  dementia, hypertension, BPH, chronic kidney disease stage II, diabetes mellitus type 2 was  Clinical Impression   Pt admitted with above diagnosis.  Pt currently with functional limitations due to the deficits listed below (see PT Problem List). Pt will benefit from acute skilled PT to increase their independence and safety with mobility to allow discharge.     Bo report from MSW, patent's daughter would like for patient to Dc to home and not return to LTC. Patient required max assistance to mobilize and stand, significant posterior lean. Patient currently requires 2 person assistance for mobility. Family not present. Per report , patient was ambulatory at the LTC facility.  Patient will benefit from continued inpatient follow up therapy, <3 hours/day   Recommend  hospital bed and WC if plans are to return home, will need close 24/7  as patient is able to move himself in bed.        If plan is discharge home, recommend the following: Two people to help with walking and/or transfers;Two people to help with bathing/dressing/bathroom;Assistance with cooking/housework;Assistance with feeding;Direct supervision/assist for medications management;Direct supervision/assist for financial management;Supervision due to cognitive status   Can travel by private vehicle   No    Equipment Recommendations Wheelchair (measurements PT);Wheelchair cushion (measurements PT);Hospital bed  Recommendations for Other Services       Functional Status Assessment Patient has had a recent decline in their functional status and/or demonstrates limited ability to make significant improvements in function in a  reasonable and predictable amount of time     Precautions / Restrictions Precautions Precautions: Fall      Mobility  Bed Mobility Overal bed mobility: Needs Assistance Bed Mobility: Supine to Sit, Sit to Supine     Supine to sit: Max assist Sit to supine: Max assist   General bed mobility comments: assiste with legs and trunk, max support with patient followiung therapist's lead    Transfers Overall transfer level: Needs assistance Equipment used: Rolling walker (2 wheels) Transfers: Sit to/from Stand Sit to Stand: Max assist           General transfer comment: Max support to stand from bed x 4 trials, hand over hand  to reach for RW. Max support to power up. Stood ~ 15 secs each.Unable to attempt steps with only 1 person assisting    Ambulation/Gait                  Stairs            Wheelchair Mobility     Tilt Bed    Modified Rankin (Stroke Patients Only)       Balance Overall balance assessment: Needs assistance, History of Falls Sitting-balance support: Bilateral upper extremity supported, Feet supported Sitting balance-Leahy Scale: Poor   Postural control: Posterior lean Standing balance support: Bilateral upper extremity supported, Reliant on assistive device for balance, During functional activity Standing balance-Leahy Scale: Poor Standing balance comment: support in back to porevent posterior lean                             Pertinent Vitals/Pain Pain Assessment Breathing: normal Negative Vocalization: none Facial Expression: smiling or inexpressive  Body Language: relaxed Consolability: no need to console PAINAD Score: 0    Home Living Family/patient expects to be discharged to:: Skilled nursing facility                   Additional Comments: patient has been LTC at [facility].    Prior Function Prior Level of Function : Patient poor historian/Family not available             Mobility Comments: per  Reports, was shuffling to ambulate ADLs Comments: per staff     Extremity/Trunk Assessment   Upper Extremity Assessment Upper Extremity Assessment: Generalized weakness    Lower Extremity Assessment Lower Extremity Assessment: Generalized weakness    Cervical / Trunk Assessment Cervical / Trunk Assessment: Normal  Communication   Communication Communication: Impaired Factors Affecting Communication: Difficulty expressing self;Reduced clarity of speech    Cognition Arousal: Lethargic Behavior During Therapy: Flat affect   PT - Cognitive impairments: No family/caregiver present to determine baseline                       PT - Cognition Comments: does not  make eye contact, mumbles, Does not FC but did participate with standing whne given max tactile cues Following commands: Impaired       Cueing Cueing Techniques: Verbal cues, Gestural cues, Tactile cues, Visual cues     General Comments      Exercises     Assessment/Plan    PT Assessment Patient needs continued PT services  PT Problem List Decreased strength;Decreased cognition;Decreased activity tolerance;Decreased mobility;Decreased safety awareness;Decreased balance       PT Treatment Interventions DME instruction;Therapeutic exercise;Balance training;Gait training;Functional mobility training;Therapeutic activities;Patient/family education;Cognitive remediation    PT Goals (Current goals can be found in the Care Plan section)  Acute Rehab PT Goals PT Goal Formulation: Patient unable to participate in goal setting Time For Goal Achievement: 12/18/23 Potential to Achieve Goals: Fair    Frequency Min 2X/week     Co-evaluation               AM-PAC PT "6 Clicks" Mobility  Outcome Measure Help needed turning from your back to your side while in a flat bed without using bedrails?: Total Help needed moving from lying on your back to sitting on the side of a flat bed without using bedrails?:  Total Help needed moving to and from a bed to a chair (including a wheelchair)?: Total Help needed standing up from a chair using your arms (e.g., wheelchair or bedside chair)?: Total Help needed to walk in hospital room?: Total Help needed climbing 3-5 steps with a railing? : Total 6 Click Score: 6    End of Session Equipment Utilized During Treatment: Gait belt Activity Tolerance: Patient tolerated treatment well Patient left: in bed;with call bell/phone within reach;with bed alarm set Nurse Communication: Mobility status PT Visit Diagnosis: Unsteadiness on feet (R26.81);History of falling (Z91.81);Muscle weakness (generalized) (M62.81)    Time: 4782-9562 PT Time Calculation (min) (ACUTE ONLY): 17 min   Charges:   PT Evaluation $PT Eval Low Complexity: 1 Low   PT General Charges $$ ACUTE PT VISIT: 1 Visit         Blanchard Kelch PT Acute Rehabilitation Services Office [phone] Weekend pager-[phone]   Rada Hay 12/04/2023, 3:00 PM

## 2023-12-04 NOTE — NC FL2 (Signed)
 [place] MEDICAID FL2 LEVEL OF CARE FORM     IDENTIFICATION  Patient Name: Chase Mccarthy Birthdate: [birth year] Sex: male Admission Date (Current Location): 11/29/2023  [facility] and [place] Number:  [place]   Facility and Address:  [facility],  [address] [place], [place] [address]      Provider Number: [id number]  Attending Physician Name and Address:  Kathlen Mody, MD  Relative Name and Phone Number:  Holley Bouche (Daughter)  [phone]    Current Level of Care: Hospital Recommended Level of Care: Skilled Nursing Facility Prior Approval Number:    Date Approved/Denied:   PASRR Number: [id number]  Discharge Plan: SNF    Current Diagnoses: Patient Active Problem List   Diagnosis Date Noted   Acute encephalopathy 11/30/2023   CKD (chronic kidney disease) stage 3, GFR 30-59 ml/min (HCC) 11/30/2023   Protein-calorie malnutrition, severe 01/19/2023   AKI (acute kidney injury) (HCC) 01/18/2023   Hypertension    Diabetes mellitus (HCC)    Restrictive lung disease    Onychomycosis    Essential hypertension 01/13/2011   Diabetes mellitus type 2 in nonobese (HCC) 01/13/2011   Vitamin D deficiency 08/17/2009   Benign prostatic hyperplasia with urinary obstruction 01/13/2004   Hyperlipidemia with target LDL less than 100 10/28/1999   Tobacco abuse 10/15/1992    Orientation RESPIRATION BLADDER Height & Weight     Self  Normal Incontinent Weight: 131 lb 14.4 oz (59.8 kg) Height:  6' (182.9 cm)  BEHAVIORAL SYMPTOMS/MOOD NEUROLOGICAL BOWEL NUTRITION STATUS      Continent Diet (See DC summary)  AMBULATORY STATUS COMMUNICATION OF NEEDS Skin   Extensive Assist Verbally Normal                       Personal Care Assistance Level of Assistance  Bathing, Feeding, Dressing Bathing Assistance: Maximum assistance Feeding assistance: Limited assistance Dressing Assistance: Maximum assistance     Functional Limitations Info  Sight, Hearing, Speech  Sight Info: Impaired Hearing Info: Adequate Speech Info: Adequate    SPECIAL CARE FACTORS FREQUENCY  PT (By licensed PT), OT (By licensed OT)     PT Frequency: 5x/wk OT Frequency: 5x/wk            Contractures Contractures Info: Not present    Additional Factors Info  Code Status, Allergies Code Status Info: FULL Allergies Info: NKA Psychotropic Info: Zyprexa         Current Medications (12/04/2023):  This is the current hospital active medication list Current Facility-Administered Medications  Medication Dose Route Frequency Provider Last Rate Last Admin   acetaminophen (TYLENOL) tablet 650 mg  650 mg Oral Q4H PRN Kathlen Mody, MD       amLODipine (NORVASC) tablet 5 mg  5 mg Oral Daily Eduard Clos, MD   5 mg at 12/04/23 5366   divalproex (DEPAKOTE SPRINKLE) capsule 125 mg  125 mg Oral QHS Kathlen Mody, MD   125 mg at 12/03/23 2128   dutasteride (AVODART) capsule 0.5 mg  0.5 mg Oral Daily Eduard Clos, MD   0.5 mg at 12/04/23 0953   enoxaparin (LOVENOX) injection 40 mg  40 mg Subcutaneous Daily Kathlen Mody, MD   40 mg at 12/04/23 4403   haloperidol lactate (HALDOL) injection 1 mg  1 mg Intramuscular Q6H PRN Kathlen Mody, MD       hydrOXYzine (ATARAX) tablet 25 mg  25 mg Oral TID PRN Kathlen Mody, MD   25 mg at 12/04/23 0023   melatonin  tablet 3 mg  3 mg Oral QHS PRN Eduard Clos, MD   3 mg at 12/01/23 2106   methocarbamol (ROBAXIN) tablet 500 mg  500 mg Oral Q8H PRN Kathlen Mody, MD       OLANZapine (ZYPREXA) tablet 1.25 mg  1.25 mg Oral Daily Kathlen Mody, MD   1.25 mg at 12/04/23 1610     Discharge Medications: Please see discharge summary for a list of discharge medications.  Relevant Imaging Results:  Relevant Lab Results:   Additional Information SS# [id number]  Otelia Santee, LCSW

## 2023-12-04 NOTE — TOC Progression Note (Addendum)
 Transition of Care [facility]) - Progression Note    Patient Details  Name: Chase Mccarthy MRN: [id number] Date of Birth: 10-24-1940  Transition [facility]) CM/SW Contact  Otelia Santee, LCSW Phone Number: 12/04/2023, 3:09 PM  Clinical Narrative:    Reviewed HCPOA in system and determined pt's son is 1st POA.  Called and spoke with son who confirmed what sister shared of not wanting pt to return to [facility].  CSW re-dicussed need for him to return to his LTC facility or discharge home with family while they sought alternate placement. Pt's son asked if pt was being released today. CSW shared that pt is medically stable for discharge and could potentially be discharged today or tomorrow. Pt;s son stated he would contact his sister to discuss. TOC continuing to follow for discharge plan.   ADDENDUM: Discussed w/ medical team. [facility]. Referrals have been faxed out. Awaiting bed offers.    Expected Discharge Plan: Long Term Nursing Home Barriers to Discharge: Continued Medical Work up  Expected Discharge Plan and Services       Living arrangements for the past 2 months: Skilled Nursing Facility                                       Social Determinants of Health (SDOH) Interventions SDOH Screenings   Food Insecurity: Patient Unable To Answer (12/01/2023)  Housing: Patient Unable To Answer (12/01/2023)  Transportation Needs: Patient Unable To Answer (12/01/2023)  Utilities: Patient Unable To Answer (12/01/2023)  Social Connections: Patient Unable To Answer (12/01/2023)  Tobacco Use: High Risk (11/29/2023)    Readmission Risk Interventions     No data to display

## 2023-12-04 NOTE — Plan of Care (Signed)
  Problem: Education: Goal: Knowledge of General Education information will improve Description: Including pain rating scale, medication(s)/side effects and non-pharmacologic comfort measures Outcome: Progressing   Problem: Health Behavior/Discharge Planning: Goal: Ability to manage health-related needs will improve Outcome: Progressing   Problem: Clinical Measurements: Goal: Ability to maintain clinical measurements within normal limits will improve Outcome: Progressing Goal: Will remain free from infection Outcome: Progressing Goal: Diagnostic test results will improve Outcome: Progressing Goal: Respiratory complications will improve Outcome: Progressing Goal: Cardiovascular complication will be avoided Outcome: Progressing   Problem: Activity: Goal: Risk for activity intolerance will decrease Outcome: Progressing   Problem: Nutrition: Goal: Adequate nutrition will be maintained Outcome: Progressing   Problem: Coping: Goal: Level of anxiety will decrease Outcome: Progressing   Problem: Elimination: Goal: Will not experience complications related to bowel motility Outcome: Progressing Goal: Will not experience complications related to urinary retention Outcome: Progressing   Problem: Pain Managment: Goal: General experience of comfort will improve and/or be controlled Outcome: Progressing   Problem: Safety: Goal: Ability to remain free from injury will improve Outcome: Progressing   Problem: Skin Integrity: Goal: Risk for impaired skin integrity will decrease Outcome: Progressing   Problem: Education: Goal: Individualized Educational Video(s) Outcome: Progressing   Problem: Metabolic: Goal: Ability to maintain appropriate glucose levels will improve Outcome: Progressing   Problem: Nutritional: Goal: Maintenance of adequate nutrition will improve Outcome: Progressing   Problem: Safety: Goal: Non-violent Restraint(s) Outcome: Progressing

## 2023-12-04 NOTE — Progress Notes (Signed)
 OT Cancellation Note  Patient Details Name: Chase Mccarthy MRN: [id number] DOB: 1940/11/06   Cancelled Treatment:    Reason Eval/Treat Not Completed: OT screened, no needs identified, will sign off OT screened, no needs identified, will sign off Pt from LTC SNF per TOC notes.  Does not appear appropriate for acute OT.  OT to sign off.  Rosalio Loud, MS Acute Rehabilitation Department Office# [phone]  12/04/2023, 12:27 PM

## 2023-12-04 NOTE — Progress Notes (Signed)
 [facility]                                                                               Chase Mccarthy, is a 83 y.o. male, DOB - 02/12/1941, WGN:[id number] Admit date - 11/29/2023    Outpatient Primary MD for the patient is Gwenyth Bender, MD  LOS - 0  days    Brief summary    Chase Mccarthy is a 83 y.o. male with history of dementia, hypertension, BPH, chronic kidney disease stage II, diabetes mellitus type 2 was brought to the ER after patient had an unwitnessed fall at the living facility.    X-ray pelvis chest x-ray unremarkable. Urine drug screen negative.    Patient admitted for acute metabolic encephalopathy . MRI brain is negative for acute stroke.   Assessment & Plan    Assessment and Plan:  Acute metabolic encephalopathy superimposed on underlying dementia: Suspect from dehydration vs worsening of his dementia.   He remains confused but no more agitated or delirious.  MRI is negative for acute stroke.  EEG shows diffuse encephalopathy. Ammonia is 23, TSH wnl. Vit b12 and folic acid levels ordered.  CK levels ordered to evaluate for rhabdomyolysis as he has some generalized weakness  No signs of infection so far.  Recommend outpatient follow up with Neurology to evaluate for worsening dementia.  SLP eval recommending dysphagia 1 diet with thin liquid.  UDS IS NEGATIVE , CXR is negative for acute cardiopulm disease. UA is negative.  Currently on low dose of zyprexa 1.25 daily.  He eats about 50% of his meals as per RN.   Stage 2 CKD Creatinine at baseline.  Monitor.    Type 2 diabetes mellitus, non insulin dependent.  CBG (last 3)  Recent Labs    12/04/23 0732 12/04/23 1207 12/04/23 1547  GLUCAP 85 119* 102*   A1c is 5.6. diet controlled.  He had 2 episodes of hypoglycemia on admission. Started the patient on IV dextrose fluids and discontinued the IV fluids as his oral intake continued to improve.    Anxiety Started him on atarax.   Neck  pain: tylenol ordered.    Hypertension:  Well controlled BP parameters.   Therapy evaluations ordered.  Patient's daughter would like to take him home.      Estimated body mass index is 17.89 kg/m as calculated from the following:   Height as of this encounter: 6' (1.829 m).   Weight as of this encounter: 59.8 kg.  Code Status:  full code.  DVT Prophylaxis:  enoxaparin (LOVENOX) injection 40 mg Start: 12/01/23 1000   Level of Care: Level of care: Telemetry Family Communication: discussed the plan with daughter over the phone.   Disposition Plan:     Remains inpatient appropriate:  patient medically stable for discharge. , family requesting repeat PT/OT evaluation.   Procedures:  EEG.  MRI brain.   Consultants:   None.   Antimicrobials:   Anti-infectives (From admission, onward)    None        Medications  Scheduled Meds:  amLODipine  5 mg Oral Daily   divalproex  125 mg Oral QHS   dutasteride  0.5 mg Oral Daily   enoxaparin (LOVENOX) injection  40 mg Subcutaneous Daily   OLANZapine  1.25 mg Oral Daily   Continuous Infusions:   PRN Meds:.acetaminophen, haloperidol lactate, hydrOXYzine, melatonin, methocarbamol    Subjective:   Chase Mccarthy was seen and examined today.  No new events today. Daughter requesting another therapy evaluation.   Objective:   Vitals:   12/03/23 1212 12/03/23 2008 12/04/23 0454 12/04/23 1205  BP: (!) 141/89 136/69 113/65 120/64  Pulse: 66 67 (!) 57 71  Resp:  20 18 15   Temp: (!) 97.4 F (36.3 C) 98 F (36.7 C) 98.4 F (36.9 C) 98.1 F (36.7 C)  TempSrc:      SpO2: 99% 100% 99% 100%  Weight:      Height:        Intake/Output Summary (Last 24 hours) at 12/04/2023 1915 Last data filed at 12/04/2023 1322 Gross per 24 hour  Intake 715 ml  Output 425 ml  Net 290 ml   Filed Weights   12/01/23 1100  Weight: 59.8 kg     Exam General exam: elderly frail gentleman , not in distress.  Respiratory system: Clear to  auscultation. Respiratory effort normal. Cardiovascular system: S1 & S2 heard, RRR.  Gastrointestinal system: Abdomen is nondistended, soft and nontender.  Central nervous system: Alert and oriented to person only.  Extremities: no pedal edema.  Skin: No rashes,  Psychiatry: calm.       Data Reviewed:  I have personally reviewed following labs and imaging studies   CBC Lab Results  Component Value Date   WBC 4.5 12/03/2023   RBC 4.64 12/03/2023   HGB 14.1 12/03/2023   HCT 42.6 12/03/2023   MCV 91.8 12/03/2023   MCH 30.4 12/03/2023   PLT 162 12/03/2023   MCHC 33.1 12/03/2023   RDW 12.6 12/03/2023   LYMPHSABS 1.3 12/03/2023   MONOABS 0.4 12/03/2023   EOSABS 0.3 12/03/2023   BASOSABS 0.1 12/03/2023     Last metabolic panel Lab Results  Component Value Date   NA 140 12/03/2023   K 4.1 12/03/2023   CL 107 12/03/2023   CO2 27 12/03/2023   BUN 13 12/03/2023   CREATININE 1.25 (H) 12/03/2023   GLUCOSE 102 (H) 12/03/2023   GFRNONAA 57 (L) 12/03/2023   CALCIUM 9.0 12/03/2023   PHOS 2.3 (L) 01/19/2023   PROT 6.3 (L) 11/30/2023   ALBUMIN 3.4 (L) 11/30/2023   BILITOT 1.3 (H) 11/30/2023   ALKPHOS 77 11/30/2023   AST 20 11/30/2023   ALT 12 11/30/2023   ANIONGAP 6 12/03/2023    CBG (last 3)  Recent Labs    12/04/23 0732 12/04/23 1207 12/04/23 1547  GLUCAP 85 119* 102*      Coagulation Profile: No results for input(s): "INR", "PROTIME" in the last 168 hours.   Radiology Studies: No results found.      Kathlen Mody M.D. [facility] 12/04/2023, 7:15 PM  Available via Epic secure chat 7am-7pm After 7 pm, please refer to night coverage provider listed on amion.

## 2023-12-05 DIAGNOSIS — E119 Type 2 diabetes mellitus without complications: Secondary | ICD-10-CM | POA: Diagnosis not present

## 2023-12-05 DIAGNOSIS — N138 Other obstructive and reflux uropathy: Secondary | ICD-10-CM | POA: Diagnosis not present

## 2023-12-05 DIAGNOSIS — G9340 Encephalopathy, unspecified: Secondary | ICD-10-CM | POA: Diagnosis not present

## 2023-12-05 DIAGNOSIS — N401 Enlarged prostate with lower urinary tract symptoms: Secondary | ICD-10-CM | POA: Diagnosis not present

## 2023-12-05 DIAGNOSIS — I10 Essential (primary) hypertension: Secondary | ICD-10-CM | POA: Diagnosis not present

## 2023-12-05 LAB — GLUCOSE, CAPILLARY
Glucose-Capillary: 111 mg/dL — ABNORMAL HIGH (ref 70–99)
Glucose-Capillary: 112 mg/dL — ABNORMAL HIGH (ref 70–99)
Glucose-Capillary: 121 mg/dL — ABNORMAL HIGH (ref 70–99)
Glucose-Capillary: 128 mg/dL — ABNORMAL HIGH (ref 70–99)
Glucose-Capillary: 142 mg/dL — ABNORMAL HIGH (ref 70–99)

## 2023-12-05 LAB — BASIC METABOLIC PANEL
Anion gap: 11 (ref 5–15)
BUN: 21 mg/dL (ref 8–23)
CO2: 22 mmol/L (ref 22–32)
Calcium: 9 mg/dL (ref 8.9–10.3)
Chloride: 108 mmol/L (ref 98–111)
Creatinine, Ser: 1.09 mg/dL (ref 0.61–1.24)
GFR, Estimated: >60 mL/min (ref >60)
Glucose, Bld: 132 mg/dL — ABNORMAL HIGH (ref 70–99)
Potassium: 4.1 mmol/L (ref 3.5–5.1)
Sodium: 141 mmol/L (ref 135–145)

## 2023-12-05 LAB — PHOSPHORUS: Phosphorus: 2.7 mg/dL (ref 2.5–4.6)

## 2023-12-05 LAB — FOLATE: Folate: 6.3 ng/mL (ref >5.9)

## 2023-12-05 LAB — MAGNESIUM: Magnesium: 1.8 mg/dL (ref 1.7–2.4)

## 2023-12-05 LAB — VITAMIN B12: Vitamin B-12: 238 pg/mL (ref 180–914)

## 2023-12-05 LAB — CK: Total CK: 72 U/L (ref 49–397)

## 2023-12-05 MED ORDER — AMLODIPINE BESYLATE 5 MG PO TABS: 5.0000 mg | ORAL_TABLET | Freq: Every day | ORAL | 0 refills | Status: DC

## 2023-12-05 NOTE — TOC Progression Note (Addendum)
 Transition of Care [facility]) - Progression Note    Patient Details  Name: Chase Mccarthy MRN: [id number] Date of Birth: October 08, 1940  Transition of Care [facility]) CM/SW Contact  Otelia Santee, LCSW Phone Number: 12/05/2023, 8:34 AM  Clinical Narrative:    List of SNF bed offers emailed to pt's son for review. Awaiting bed choice.  ADDENDUM: Spoke with son who shares that they just completed a guardianship hearing and pt's daughter has been assigned as guardian. Son states all conversation going forward should be had with his sister/pt's daughter. CSW explained to pt's son that until the guardianship has actually been filed in the courts system and hospital has received paperwork he would need to remain primary POC as POA. Pt's son shares that his sister will be viewing facilities in person today and would have a facility selected by the end of the day. TOC will follow for choice.  ADDENDUM: Spoke to pt's son who shares his sister has selected [facility] as facility of choice. Pt's son requested CSW to call his sister to confirm. VM left with pt's daughter to confirm bed choice. Insurance [employer] has been requested and has been approved. Pt able to transfer to SNF tomorrow.   Expected Discharge Plan: Long Term Nursing Home Barriers to Discharge: Continued Medical Work up  Expected Discharge Plan and Services       Living arrangements for the past 2 months: Skilled Nursing Facility                                       Social Determinants of Health (SDOH) Interventions SDOH Screenings   Food Insecurity: Patient Unable To Answer (12/01/2023)  Housing: Patient Unable To Answer (12/01/2023)  Transportation Needs: Patient Unable To Answer (12/01/2023)  Utilities: Patient Unable To Answer (12/01/2023)  Social Connections: Patient Unable To Answer (12/01/2023)  Tobacco Use: High Risk (11/29/2023)    Readmission Risk Interventions     No data to display

## 2023-12-05 NOTE — Discharge Summary (Signed)
 Physician Discharge Summary   Patient: Chase Mccarthy MRN: [id number] DOB: December 14, 1940  Admit date:     11/29/2023  Discharge date: 12/06/23  Discharge Physician: Kathlen Mody   PCP: Gwenyth Bender, MD   Recommendations at discharge:  Please follow up with PCP in one week.  Please follow up with cbc and bmp in one week.  Please follow up with neurology for evaluation of worsening dementia.  Recommend outpatient palliative follow up .  Recommend full supervision and assistance during feeding.    Discharge Diagnoses: Principal Problem:   Acute encephalopathy Active Problems:   Essential hypertension   Diabetes mellitus type 2 in nonobese (HCC)   Benign prostatic hyperplasia with urinary obstruction   Hypertension   CKD (chronic kidney disease) stage 3, GFR 30-59 ml/min [facility])    Hospital Course: Chase Mccarthy is a 83 y.o. male with history of dementia, hypertension, BPH, chronic kidney disease stage II, diabetes mellitus type 2 was brought to the ER after patient had an unwitnessed fall at the living facility.    X-ray pelvis chest x-ray unremarkable. Urine drug screen negative.    Patient admitted for acute metabolic encephalopathy . MRI brain is negative for acute stroke.   Assessment and Plan:    Acute metabolic encephalopathy superimposed on underlying dementia: Suspect from dehydration vs worsening of his dementia.  MRI is negative for acute stroke.  EEG shows diffuse encephalopathy. Ammonia is 23, TSH wnl. Vit b12 and folic acid levels ordered.  CK levels ordered to evaluate for rhabdomyolysis as he has some generalized weakness  No signs of infection so far.  Recommend outpatient follow up with Neurology to evaluate for worsening dementia.  SLP eval recommending dysphagia 1 diet with thin liquid.  UDS IS NEGATIVE , CXR is negative for acute cardiopulm disease. UA is negative.  Currently on low dose of zyprexa 1.25 daily.  He eats about 50% of his meals as per RN.  Recommend full supervision and assistance during feeding.    Stage 2 CKD Creatinine at baseline.  Monitor.      Type 2 diabetes mellitus, non insulin dependent.  CBG (last 3)  Recent Labs    12/05/23 0504 12/05/23 0751 12/05/23 1209  GLUCAP 128* 121* 112*    A1c is 5.6. diet controlled.  He had 2 episodes of hypoglycemia on admission. Started the patient on IV dextrose fluids and discontinued the IV fluids as his oral intake continued to improve.      Anxiety Resolved.  Neck pain: tylenol ordered.      Hypertension:  Optimal. Continue with norvasc.    Therapy evaluations ordered. SNF on discharge.   Senile demential with some behavioral abnormalities on admission.  As per the daughter the namenda and seroquel have been discontinued.  Patient on depakote at facility . Continue the same.              Consultants: none.  Procedures performed: MRI brain.   Disposition: Skilled nursing facility Diet recommendation:  Dysphagia type 1 thin Liquid DISCHARGE MEDICATION: Allergies as of 12/05/2023   No Known Allergies      Medication List     STOP taking these medications    dutasteride 0.5 MG capsule Commonly known as: AVODART   memantine 10 MG tablet Commonly known as: NAMENDA   polyethylene glycol 17 g packet Commonly known as: MIRALAX / GLYCOLAX   QUEtiapine 50 MG tablet Commonly known as: SEROQUEL       TAKE these medications  acetaminophen 650 MG CR tablet Commonly known as: TYLENOL Take 650 mg by mouth in the morning and at bedtime.   amLODipine 5 MG tablet Commonly known as: NORVASC Take 1 tablet (5 mg total) by mouth daily.   aspirin EC 81 MG tablet Take 1 tablet (81 mg total) by mouth daily. Swallow whole.   divalproex 125 MG capsule Commonly known as: DEPAKOTE SPRINKLE Take 125 mg by mouth at bedtime.   feeding supplement Liqd Take 237 mLs by mouth 2 (two) times daily between meals.   finasteride 5 MG tablet Commonly  known as: PROSCAR Take 5 mg by mouth daily.   melatonin 3 MG Tabs tablet Take 3 mg by mouth at bedtime.   methocarbamol 500 MG tablet Commonly known as: ROBAXIN Take 500 mg by mouth daily at 12 noon.   MULTIVITAL PO Take 1 tablet by mouth daily.   Vitamin D (Cholecalciferol) 25 MCG (1000 UT) Caps Take 1 tablet by mouth daily.        Discharge Exam: Filed Weights   12/01/23 1100  Weight: 59.8 kg   General exam: ill appearing elderly gentleman, not in distress.  Respiratory system: Clear to auscultation. Respiratory effort normal. Cardiovascular system: S1 & S2 heard, RRR. No JVD,  Gastrointestinal system: Abdomen is nondistended, soft and nontender.  Central nervous system: Alert and oriented to person only.  Extremities: no pedal edema. Skin: No rashes, Psychiatry: unable to assess due to dementia.    Condition at discharge: fair  The results of significant diagnostics from this hospitalization (including imaging, microbiology, ancillary and laboratory) are listed below for reference.   Imaging Studies: EEG adult Result Date: 11/30/2023 Charlsie Quest, MD     11/30/2023 10:02 PM Patient Name: Chase Mccarthy MRN: [id number] Epilepsy Attending: Charlsie Quest Referring Physician/Provider: Eduard Clos, MD Date: 11/30/2023 Duration: 25.04 mins Patient history: 83yo M with ams. EEG to evaluate for seizure Level of alertness: Awake AEDs during EEG study: None Technical aspects: This EEG study was done with scalp electrodes positioned according to the 10-20 International system of electrode placement. Electrical activity was reviewed with band pass filter of 1-70Hz , sensitivity of 7 uV/mm, display speed of 36mm/sec with a 60Hz  notched filter applied as appropriate. EEG data were recorded continuously and digitally stored.  Video monitoring was available and reviewed as appropriate. Description: The posterior dominant rhythm consists of 8 Hz activity of moderate voltage  (25-35 uV) seen predominantly in posterior head regions, symmetric and reactive to eye opening and eye closing. EEG showed intermittent generalized 3 to 6 Hz theta-delta slowing. Hyperventilation and photic stimulation were not performed.   ABNORMALITY - Intermittent slow, generalized IMPRESSION: This study is suggestive of mild diffuse encephalopathy. No seizures or epileptiform discharges were seen throughout the recording. Charlsie Quest   MR BRAIN WO CONTRAST Result Date: 11/30/2023 CLINICAL DATA:  Initial evaluation for acute neuro deficit, stroke suspected. EXAM: MRI HEAD WITHOUT CONTRAST TECHNIQUE: Multiplanar, multiecho pulse sequences of the brain and surrounding structures were obtained without intravenous contrast. COMPARISON:  CT from 11/29/2023 FINDINGS: Brain: Examination severely degraded by motion artifact. Additionally, patient was unable to tolerate the full length of the exam. Mild age-related cerebral atrophy. No evidence for acute or subacute infarct. No visible areas of chronic cortical infarction. No visible acute or chronic intracranial blood products. No mass lesion, midline shift or mass effect. No hydrocephalus or extra-axial fluid collection. Pituitary gland grossly within normal limits. Vascular: Major intracranial vascular flow voids are grossly maintained  at the skull base, although poorly evaluated on this limited exam. Skull and upper cervical spine: Craniocervical junction within normal limits. Bone marrow signal intensity grossly normal. No visible scalp soft tissue abnormality. Sinuses/Orbits: Prior bowel ocular lens replacement. Paranasal sinuses and mastoid air cells appear largely clear. Other: None. IMPRESSION: 1. Severely motion degraded and limited exam. 2. Grossly negative brain MRI. No acute intracranial abnormality. Electronically Signed   By: Rise Mu M.D.   On: 11/30/2023 03:34   DG Chest Portable 1 View Result Date: 11/29/2023 CLINICAL DATA:  Fall,  concern for fracture. EXAM: PORTABLE CHEST 1 VIEW, PORTABLE PELVIS COMPARISON:  01/18/2023. FINDINGS: Chest: The heart size and mediastinal contours are within normal limits. There is atherosclerotic calcification of the aorta. No consolidation, effusion, or pneumothorax is seen. No acute osseous abnormality is identified. Pelvis: No acute fracture or dislocation is seen. Mild degenerative changes are present at the hips bilaterally. Degenerative changes are noted in the lower lumbar spine IMPRESSION: 1. No acute cardiopulmonary process. 2. No acute fracture or dislocation at the pelvis. Electronically Signed   By: Thornell Sartorius M.D.   On: 11/29/2023 20:26   DG Pelvis Portable Result Date: 11/29/2023 CLINICAL DATA:  Fall, concern for fracture. EXAM: PORTABLE CHEST 1 VIEW, PORTABLE PELVIS COMPARISON:  01/18/2023. FINDINGS: Chest: The heart size and mediastinal contours are within normal limits. There is atherosclerotic calcification of the aorta. No consolidation, effusion, or pneumothorax is seen. No acute osseous abnormality is identified. Pelvis: No acute fracture or dislocation is seen. Mild degenerative changes are present at the hips bilaterally. Degenerative changes are noted in the lower lumbar spine IMPRESSION: 1. No acute cardiopulmonary process. 2. No acute fracture or dislocation at the pelvis. Electronically Signed   By: Thornell Sartorius M.D.   On: 11/29/2023 20:26   CT Head Wo Contrast Result Date: 11/29/2023 CLINICAL DATA:  Increased falls and dysphagia. EXAM: CT HEAD WITHOUT CONTRAST TECHNIQUE: Contiguous axial images were obtained from the base of the skull through the vertex without intravenous contrast. RADIATION DOSE REDUCTION: This exam was performed according to the departmental dose-optimization program which includes automated exposure control, adjustment of the mA and/or kV according to patient size and/or use of iterative reconstruction technique. COMPARISON:  Jan 18, 2023 FINDINGS: Brain:  There is mild cerebral atrophy with widening of the extra-axial spaces and ventricular dilatation. There are areas of decreased attenuation within the white matter tracts of the supratentorial brain, consistent with microvascular disease changes. Vascular: Mild to moderate severity bilateral cavernous carotid artery calcification is noted. Skull: Normal. Negative for fracture or focal lesion. Sinuses/Orbits: No acute finding. Other: None. IMPRESSION: 1. No acute intracranial abnormality. 2. Generalized cerebral atrophy and microvascular disease changes of the supratentorial brain. Electronically Signed   By: Aram Candela M.D.   On: 11/29/2023 19:20    Microbiology: Results for orders placed or performed during the hospital encounter of 01/18/23  Culture, blood (routine x 2)     Status: None   Collection Time: 01/18/23  6:08 PM   Specimen: BLOOD RIGHT ARM  Result Value Ref Range Status   Specimen Description   Final    BLOOD RIGHT ARM Performed at [facility], [address] [place], [place] [address]    Special Requests   Final    BOTTLES DRAWN AEROBIC AND ANAEROBIC Blood Culture adequate volume Performed at [facility], [address] [place], [place] [address]    Culture   Final    NO GROWTH  5 DAYS Performed at [facility], [address], [place], [place] [address]    Report Status 01/23/2023 FINAL  Final  Culture, blood (routine x 2)     Status: None   Collection Time: 01/18/23  6:08 PM   Specimen: BLOOD LEFT ARM  Result Value Ref Range Status   Specimen Description   Final    BLOOD LEFT ARM Performed at [facility], [address], [place], [place] [address]    Special Requests   Final    BOTTLES DRAWN AEROBIC AND ANAEROBIC Blood Culture adequate volume Performed at [facility], [address] [place], [place] [address]    Culture   Final    NO GROWTH 5 DAYS Performed at [facility], [address] [place], [place] [address]    Report Status 01/23/2023 FINAL  Final    Labs: CBC: Recent Labs  Lab 11/29/23 1824 11/30/23 0618 12/01/23 0854 12/03/23 1700  WBC 4.0 3.5* 3.8* 4.5  NEUTROABS  --  1.4* 2.0 2.4  HGB 13.2 12.4* 13.3 14.1  HCT 40.5 38.4* 40.5 42.6  MCV 93.1 94.1 91.6 91.8  PLT 191 162 171 162   Basic Metabolic Panel: Recent Labs  Lab 11/29/23 1824 11/30/23 0618 12/01/23 0854 12/03/23 1700 12/05/23 0549  NA 139 139 140 140 141  K 4.2 4.4 3.8 4.1 4.1  CL 103 107 108 107 108  CO2 27 25 26 27 22   GLUCOSE 105* 80 105* 102* 132*  BUN 17 16 17 13 21   CREATININE 1.38* 1.28* 1.23 1.25* 1.09  CALCIUM 9.4 9.0 8.9 9.0 9.0  MG  --  2.1  --   --  1.8  PHOS  --   --   --   --  2.7   Liver Function Tests: Recent Labs  Lab 11/29/23 1824 11/30/23 0618  AST 17 20  ALT 11 12  ALKPHOS 84 77  BILITOT 0.7 1.3*  PROT 7.3 6.3*  ALBUMIN 4.2 3.4*   CBG: Recent Labs  Lab 12/04/23 2107 12/05/23 0007 12/05/23 0504 12/05/23 0751 12/05/23 1209  GLUCAP 154* 142* 128* 121* 112*    Discharge time spent: 37 minutes.   Signed: Kathlen Mody, MD [facility] 12/05/2023

## 2023-12-05 NOTE — Plan of Care (Addendum)
 VSS. BG stable. No BM this shift. Patient remains in waist restraint for safety. Gave daughter update over the phone. No acute events overnight.  Problem: Education: Goal: Knowledge of General Education information will improve Description: Including pain rating scale, medication(s)/side effects and non-pharmacologic comfort measures Outcome: Progressing   Problem: Clinical Measurements: Goal: Ability to maintain clinical measurements within normal limits will improve Outcome: Progressing Goal: Will remain free from infection Outcome: Progressing   Problem: Nutrition: Goal: Adequate nutrition will be maintained Outcome: Progressing   Problem: Pain Managment: Goal: General experience of comfort will improve and/or be controlled Outcome: Progressing   Problem: Safety: Goal: Ability to remain free from injury will improve Outcome: Progressing   Problem: Skin Integrity: Goal: Risk for impaired skin integrity will decrease Outcome: Progressing   Problem: Metabolic: Goal: Ability to maintain appropriate glucose levels will improve Outcome: Progressing   Problem: Nutritional: Goal: Maintenance of adequate nutrition will improve Outcome: Progressing   Problem: Safety: Goal: Non-violent Restraint(s) Outcome: Progressing

## 2023-12-06 DIAGNOSIS — G9341 Metabolic encephalopathy: Secondary | ICD-10-CM | POA: Diagnosis not present

## 2023-12-06 DIAGNOSIS — M47816 Spondylosis without myelopathy or radiculopathy, lumbar region: Secondary | ICD-10-CM | POA: Diagnosis not present

## 2023-12-06 DIAGNOSIS — Z79899 Other long term (current) drug therapy: Secondary | ICD-10-CM | POA: Diagnosis not present

## 2023-12-06 DIAGNOSIS — F0390 Unspecified dementia without behavioral disturbance: Secondary | ICD-10-CM | POA: Diagnosis not present

## 2023-12-06 DIAGNOSIS — E1122 Type 2 diabetes mellitus with diabetic chronic kidney disease: Secondary | ICD-10-CM | POA: Diagnosis not present

## 2023-12-06 DIAGNOSIS — R4182 Altered mental status, unspecified: Secondary | ICD-10-CM | POA: Diagnosis not present

## 2023-12-06 DIAGNOSIS — E568 Deficiency of other vitamins: Secondary | ICD-10-CM | POA: Diagnosis not present

## 2023-12-06 DIAGNOSIS — W19XXXA Unspecified fall, initial encounter: Secondary | ICD-10-CM | POA: Diagnosis not present

## 2023-12-06 DIAGNOSIS — M158 Other polyosteoarthritis: Secondary | ICD-10-CM | POA: Diagnosis not present

## 2023-12-06 DIAGNOSIS — E785 Hyperlipidemia, unspecified: Secondary | ICD-10-CM | POA: Diagnosis not present

## 2023-12-06 DIAGNOSIS — M6281 Muscle weakness (generalized): Secondary | ICD-10-CM | POA: Diagnosis not present

## 2023-12-06 DIAGNOSIS — G9340 Encephalopathy, unspecified: Secondary | ICD-10-CM | POA: Diagnosis not present

## 2023-12-06 DIAGNOSIS — Z7401 Bed confinement status: Secondary | ICD-10-CM | POA: Diagnosis not present

## 2023-12-06 DIAGNOSIS — Z515 Encounter for palliative care: Secondary | ICD-10-CM | POA: Diagnosis not present

## 2023-12-06 DIAGNOSIS — Z043 Encounter for examination and observation following other accident: Secondary | ICD-10-CM | POA: Diagnosis not present

## 2023-12-06 DIAGNOSIS — I129 Hypertensive chronic kidney disease with stage 1 through stage 4 chronic kidney disease, or unspecified chronic kidney disease: Secondary | ICD-10-CM | POA: Diagnosis not present

## 2023-12-06 DIAGNOSIS — N138 Other obstructive and reflux uropathy: Secondary | ICD-10-CM | POA: Diagnosis not present

## 2023-12-06 DIAGNOSIS — Z9181 History of falling: Secondary | ICD-10-CM | POA: Diagnosis not present

## 2023-12-06 DIAGNOSIS — F4489 Other dissociative and conversion disorders: Secondary | ICD-10-CM | POA: Diagnosis not present

## 2023-12-06 DIAGNOSIS — R410 Disorientation, unspecified: Secondary | ICD-10-CM | POA: Diagnosis present

## 2023-12-06 DIAGNOSIS — R296 Repeated falls: Secondary | ICD-10-CM | POA: Diagnosis not present

## 2023-12-06 DIAGNOSIS — N183 Chronic kidney disease, stage 3 unspecified: Secondary | ICD-10-CM | POA: Diagnosis not present

## 2023-12-06 DIAGNOSIS — R1310 Dysphagia, unspecified: Secondary | ICD-10-CM | POA: Diagnosis not present

## 2023-12-06 DIAGNOSIS — E43 Unspecified severe protein-calorie malnutrition: Secondary | ICD-10-CM | POA: Diagnosis not present

## 2023-12-06 DIAGNOSIS — E119 Type 2 diabetes mellitus without complications: Secondary | ICD-10-CM | POA: Diagnosis not present

## 2023-12-06 DIAGNOSIS — R059 Cough, unspecified: Secondary | ICD-10-CM | POA: Diagnosis not present

## 2023-12-06 DIAGNOSIS — F03B0 Unspecified dementia, moderate, without behavioral disturbance, psychotic disturbance, mood disturbance, and anxiety: Secondary | ICD-10-CM | POA: Diagnosis not present

## 2023-12-06 DIAGNOSIS — N401 Enlarged prostate with lower urinary tract symptoms: Secondary | ICD-10-CM | POA: Diagnosis not present

## 2023-12-06 DIAGNOSIS — R531 Weakness: Secondary | ICD-10-CM | POA: Diagnosis not present

## 2023-12-06 DIAGNOSIS — S0990XA Unspecified injury of head, initial encounter: Secondary | ICD-10-CM | POA: Diagnosis not present

## 2023-12-06 DIAGNOSIS — Z7982 Long term (current) use of aspirin: Secondary | ICD-10-CM | POA: Diagnosis not present

## 2023-12-06 DIAGNOSIS — I10 Essential (primary) hypertension: Secondary | ICD-10-CM | POA: Diagnosis not present

## 2023-12-06 DIAGNOSIS — J984 Other disorders of lung: Secondary | ICD-10-CM | POA: Diagnosis not present

## 2023-12-06 MED ORDER — VITAMIN B-12 1000 MCG PO TABS: 1000.0000 ug | ORAL_TABLET | Freq: Every day | ORAL | Status: AC

## 2023-12-06 NOTE — Plan of Care (Signed)
 VSS. No BM this shift. Patient remains in waist restraint for safety. Daughter given update over the phone. No acute events overnight.  Problem: Education: Goal: Knowledge of General Education information will improve Description: Including pain rating scale, medication(s)/side effects and non-pharmacologic comfort measures Outcome: Progressing   Problem: Clinical Measurements: Goal: Ability to maintain clinical measurements within normal limits will improve Outcome: Progressing Goal: Will remain free from infection Outcome: Progressing   Problem: Activity: Goal: Risk for activity intolerance will decrease Outcome: Progressing   Problem: Safety: Goal: Ability to remain free from injury will improve Outcome: Progressing   Problem: Skin Integrity: Goal: Risk for impaired skin integrity will decrease Outcome: Progressing   Problem: Safety: Goal: Non-violent Restraint(s) Outcome: Progressing

## 2023-12-06 NOTE — Discharge Summary (Signed)
 Physician Discharge Summary  Chase Mccarthy XBM:[id number] DOB: 01-02-1941 DOA: 11/29/2023  PCP: Gwenyth Bender, MD  Admit date: 11/29/2023 Discharge date: 12/06/2023  Time spent: 40 minutes  Recommendations for Outpatient Follow-up:  Follow outpatient CBC/CMP  Follow with neurology outpatient Follow with palliative outpatient Needs full supervision and assistance during feeding   Discharge Diagnoses:  Principal Problem:   Acute encephalopathy Active Problems:   Essential hypertension   Diabetes mellitus type 2 in nonobese (HCC)   Benign prostatic hyperplasia with urinary obstruction   Hypertension   CKD (chronic kidney disease) stage 3, GFR 30-59 ml/min (HCC)   Discharge Condition: stable  Diet recommendation: dysphagia 1 thin liquid (See SLP recs below)  Filed Weights   12/01/23 1100  Weight: 59.8 kg    History of present illness:   Chase Mccarthy is Chase Mccarthy 83 y.o. male with history of dementia, hypertension, BPH, chronic kidney disease stage II, diabetes mellitus type 2 was brought to the ER after patient had an unwitnessed fall at the living facility.    X-ray pelvis chest x-ray unremarkable. Urine drug screen negative.    Patient admitted for acute metabolic encephalopathy . MRI brain is negative for acute stroke.  Currently stable for discharge to SNF.  Will need outpatient follow up for his dementia/delirium.  Hospital Course:  Assessment and Plan:  Acute metabolic encephalopathy superimposed on underlying dementia: Suspect from dehydration vs worsening of his dementia.  He remains confused, but not agitated  MRI is motion degraded, but negative without acute abnormality EEG shows diffuse encephalopathy. Ammonia is 23, TSH wnl.  Vit b12 is low normal -> supplement and follow outpatient  folic acid wnl CK level wnl No signs of infection so far.  Recommend outpatient follow up with Neurology to evaluate for worsening dementia.  SLP eval recommending dysphagia 1 diet  with thin liquid.  UDS IS NEGATIVE , CXR is negative for acute cardiopulm disease. UA is negative.  Received zyprexa 3/25-26.  Now discontinued. He eats about 50% of his meals as per RN.   Mechanical Fall  Unwitnessed Fall Plain films of pelvis without acute fracture or dislocation Head CT without acute intracranial abnormality Therapy recommending SNF  Stage 2 CKD Creatinine at baseline.  Monitor.    Type 2 diabetes mellitus, non insulin dependent.  A1c is 5.6. diet controlled.  He had 2 episodes of hypoglycemia on admission.  Resolved.     Anxiety Monitor    Neck pain: tylenol ordered. No complaints.   Hypertension:  amlodipine   Therapy evaluations ordered.  Discharging to SNF.  Dysphagia 1 diet, thin liquids.  Needs trained caregiver to feed patient.  Full supervision/cueing for compensatory strategies. Diet recommendations: Dysphagia 1 (puree);Thin liquid Liquids provided via: Cup Medication Administration: Crushed with puree Supervision: Trained caregiver to feed patient;Full supervision/cueing for compensatory strategies Compensations: Slow rate;Small sips/bites;Minimize environmental distractions;Follow solids with liquid;Other (Comment) Postural Changes and/or Swallow Maneuvers: Seated upright 90 degrees Oral care BID;Oral care before and after PO Frequent or constant Supervision/Assistance    Procedures: none   Consultations: none  Discharge Exam: Vitals:   12/05/23 2049 12/06/23 0456  BP: 122/75 133/67  Pulse: 73 74  Resp: 17 17  Temp: 98.5 F (36.9 C) 98.9 F (37.2 C)  SpO2: 100% 100%   Pleasantly confused No complaints  General: No acute distress. Cardiovascular: RRR Lungs: unlabored Abdomen: Soft, nontender, nondistended  Neurological: pleasantly confused, difficult to understand, says he's watching TV Extremities: No clubbing or cyanosis. No edema.  Discharge Instructions   Discharge Instructions     Discharge instructions    Complete by: As directed    You were admitted for an episode of confusion after an unwitnessed fall.    Your confusion was thought to be related to dehydration or progression of your dementia.  Your workup was otherwise reassuring.  You should follow up with neurology as an outpatient.  Continue [name] dysphagia 1 diet.  You'll need full supervision and assistance during feeding.   Return for new, recurrent or worsening symptoms.  Please ask your PCP to request records from this hospitalization so they know what was done and what the next steps will be.      Allergies as of 12/06/2023   No Known Allergies      Medication List     STOP taking these medications    dutasteride 0.5 MG capsule Commonly known as: AVODART   memantine 10 MG tablet Commonly known as: NAMENDA   polyethylene glycol 17 g packet Commonly known as: MIRALAX / GLYCOLAX   QUEtiapine 50 MG tablet Commonly known as: SEROQUEL       TAKE these medications    acetaminophen 650 MG CR tablet Commonly known as: TYLENOL Take 650 mg by mouth in the morning and at bedtime.   amLODipine 5 MG tablet Commonly known as: NORVASC Take 1 tablet (5 mg total) by mouth daily.   aspirin EC 81 MG tablet Take 1 tablet (81 mg total) by mouth daily. Swallow whole.   cyanocobalamin 1000 MCG tablet Commonly known as: VITAMIN B12 Take 1 tablet (1,000 mcg total) by mouth daily. Follow with outpatient provider for follow up of B12 level.   divalproex 125 MG capsule Commonly known as: DEPAKOTE SPRINKLE Take 125 mg by mouth at bedtime.   feeding supplement Liqd Take 237 mLs by mouth 2 (two) times daily between meals.   finasteride 5 MG tablet Commonly known as: PROSCAR Take 5 mg by mouth daily.   melatonin 3 MG Tabs tablet Take 3 mg by mouth at bedtime.   methocarbamol 500 MG tablet Commonly known as: ROBAXIN Take 500 mg by mouth daily at 12 noon.   MULTIVITAL PO Take 1 tablet by mouth daily.   Vitamin D  (Cholecalciferol) 25 MCG (1000 UT) Caps Take 1 tablet by mouth daily.       No Known Allergies  Contact information for after-discharge care     Destination     [facility] Preferred SNF .   Service: Skilled Nursing Contact information: [address] [place] [address] [phone]                      The results of significant diagnostics from this hospitalization (including imaging, microbiology, ancillary and laboratory) are listed below for reference.    Significant Diagnostic Studies: EEG adult Result Date: 11/30/2023 Charlsie Quest, MD     11/30/2023 10:02 PM Patient Name: SHIRLEY BOLLE MRN: [id number] Epilepsy Attending: Charlsie Quest Referring Physician/Provider: Eduard Clos, MD Date: 11/30/2023 Duration: 25.04 mins Patient history: 83yo M with ams. EEG to evaluate for seizure Level of alertness: Awake AEDs during EEG study: None Technical aspects: This EEG study was done with scalp electrodes positioned according to the 10-20 International system of electrode placement. Electrical activity was reviewed with band pass filter of 1-70Hz , sensitivity of 7 uV/mm, display speed of 50mm/sec with Hutchinson Isenberg 60Hz  notched filter applied as appropriate. EEG data were recorded continuously and digitally  stored.  Video monitoring was available and reviewed as appropriate. Description: The posterior dominant rhythm consists of 8 Hz activity of moderate voltage (25-35 uV) seen predominantly in posterior head regions, symmetric and reactive to eye opening and eye closing. EEG showed intermittent generalized 3 to 6 Hz theta-delta slowing. Hyperventilation and photic stimulation were not performed.   ABNORMALITY - Intermittent slow, generalized IMPRESSION: This study is suggestive of mild diffuse encephalopathy. No seizures or epileptiform discharges were seen throughout the recording. Charlsie Quest   MR BRAIN WO CONTRAST Result  Date: 11/30/2023 CLINICAL DATA:  Initial evaluation for acute neuro deficit, stroke suspected. EXAM: MRI HEAD WITHOUT CONTRAST TECHNIQUE: Multiplanar, multiecho pulse sequences of the brain and surrounding structures were obtained without intravenous contrast. COMPARISON:  CT from 11/29/2023 FINDINGS: Brain: Examination severely degraded by motion artifact. Additionally, patient was unable to tolerate the full length of the exam. Mild age-related cerebral atrophy. No evidence for acute or subacute infarct. No visible areas of chronic cortical infarction. No visible acute or chronic intracranial blood products. No mass lesion, midline shift or mass effect. No hydrocephalus or extra-axial fluid collection. Pituitary gland grossly within normal limits. Vascular: Major intracranial vascular flow voids are grossly maintained at the skull base, although poorly evaluated on this limited exam. Skull and upper cervical spine: Craniocervical junction within normal limits. Bone marrow signal intensity grossly normal. No visible scalp soft tissue abnormality. Sinuses/Orbits: Prior bowel ocular lens replacement. Paranasal sinuses and mastoid air cells appear largely clear. Other: None. IMPRESSION: 1. Severely motion degraded and limited exam. 2. Grossly negative brain MRI. No acute intracranial abnormality. Electronically Signed   By: Rise Mu M.D.   On: 11/30/2023 03:34   DG Chest Portable 1 View Result Date: 11/29/2023 CLINICAL DATA:  Fall, concern for fracture. EXAM: PORTABLE CHEST 1 VIEW, PORTABLE PELVIS COMPARISON:  01/18/2023. FINDINGS: Chest: The heart size and mediastinal contours are within normal limits. There is atherosclerotic calcification of the aorta. No consolidation, effusion, or pneumothorax is seen. No acute osseous abnormality is identified. Pelvis: No acute fracture or dislocation is seen. Mild degenerative changes are present at the hips bilaterally. Degenerative changes are noted in the  lower lumbar spine IMPRESSION: 1. No acute cardiopulmonary process. 2. No acute fracture or dislocation at the pelvis. Electronically Signed   By: Thornell Sartorius M.D.   On: 11/29/2023 20:26   DG Pelvis Portable Result Date: 11/29/2023 CLINICAL DATA:  Fall, concern for fracture. EXAM: PORTABLE CHEST 1 VIEW, PORTABLE PELVIS COMPARISON:  01/18/2023. FINDINGS: Chest: The heart size and mediastinal contours are within normal limits. There is atherosclerotic calcification of the aorta. No consolidation, effusion, or pneumothorax is seen. No acute osseous abnormality is identified. Pelvis: No acute fracture or dislocation is seen. Mild degenerative changes are present at the hips bilaterally. Degenerative changes are noted in the lower lumbar spine IMPRESSION: 1. No acute cardiopulmonary process. 2. No acute fracture or dislocation at the pelvis. Electronically Signed   By: Thornell Sartorius M.D.   On: 11/29/2023 20:26   CT Head Wo Contrast Result Date: 11/29/2023 CLINICAL DATA:  Increased falls and dysphagia. EXAM: CT HEAD WITHOUT CONTRAST TECHNIQUE: Contiguous axial images were obtained from the base of the skull through the vertex without intravenous contrast. RADIATION DOSE REDUCTION: This exam was performed according to the departmental dose-optimization program which includes automated exposure control, adjustment of the mA and/or kV according to patient size and/or use of iterative reconstruction technique. COMPARISON:  Jan 18, 2023 FINDINGS: Brain: There is mild cerebral  atrophy with widening of the extra-axial spaces and ventricular dilatation. There are areas of decreased attenuation within the white matter tracts of the supratentorial brain, consistent with microvascular disease changes. Vascular: Mild to moderate severity bilateral cavernous carotid artery calcification is noted. Skull: Normal. Negative for fracture or focal lesion. Sinuses/Orbits: No acute finding. Other: None. IMPRESSION: 1. No acute  intracranial abnormality. 2. Generalized cerebral atrophy and microvascular disease changes of the supratentorial brain. Electronically Signed   By: Aram Candela M.D.   On: 11/29/2023 19:20    Microbiology: No results found for this or any previous visit (from the past 240 hours).   Labs: Basic Metabolic Panel: Recent Labs  Lab 11/29/23 1824 11/30/23 0618 12/01/23 0854 12/03/23 1700 12/05/23 0549  NA 139 139 140 140 141  K 4.2 4.4 3.8 4.1 4.1  CL 103 107 108 107 108  CO2 27 25 26 27 22   GLUCOSE 105* 80 105* 102* 132*  BUN 17 16 17 13 21   CREATININE 1.38* 1.28* 1.23 1.25* 1.09  CALCIUM 9.4 9.0 8.9 9.0 9.0  MG  --  2.1  --   --  1.8  PHOS  --   --   --   --  2.7   Liver Function Tests: Recent Labs  Lab 11/29/23 1824 11/30/23 0618  AST 17 20  ALT 11 12  ALKPHOS 84 77  BILITOT 0.7 1.3*  PROT 7.3 6.3*  ALBUMIN 4.2 3.4*   No results for input(s): "LIPASE", "AMYLASE" in the last 168 hours. Recent Labs  Lab 11/29/23 1852  AMMONIA 23   CBC: Recent Labs  Lab 11/29/23 1824 11/30/23 0618 12/01/23 0854 12/03/23 1700  WBC 4.0 3.5* 3.8* 4.5  NEUTROABS  --  1.4* 2.0 2.4  HGB 13.2 12.4* 13.3 14.1  HCT 40.5 38.4* 40.5 42.6  MCV 93.1 94.1 91.6 91.8  PLT 191 162 171 162   Cardiac Enzymes: Recent Labs  Lab 11/29/23 1852 12/05/23 0549  CKTOTAL 75 72   BNP: BNP (last 3 results) Recent Labs    01/19/23 0630  BNP 100.1*    ProBNP (last 3 results) No results for input(s): "PROBNP" in the last 8760 hours.  CBG: Recent Labs  Lab 12/05/23 0007 12/05/23 0504 12/05/23 0751 12/05/23 1209 12/05/23 1644  GLUCAP 142* 128* 121* 112* 111*       Signed:  Lacretia Nicks MD.  [facility] 12/06/2023, 10:26 AM

## 2023-12-06 NOTE — TOC Transition Note (Signed)
 Transition of Care [facility]) - Discharge Note   Patient Details  Name: Chase Mccarthy MRN: [id number] Date of Birth: 05-02-1941  Transition [facility] CM/SW Contact:  Otelia Santee, LCSW Phone Number: 12/06/2023, 10:32 AM   Clinical Narrative:    Pt to transfer to [facility] for ST SNF. Pt will be going to room 3214. RN to call report to [phone]. DC packet placed at RN station. Spoke with pt's daughter and confirmed discharge plans. [facility] called at 10:22am.    Final next level of care: Skilled Nursing Facility Barriers to Discharge: Barriers Resolved   Patient Goals and CMS Choice Patient states their goals for this hospitalization and ongoing recovery are:: return to LTC faciltiy          Discharge Placement   Existing PASRR number confirmed : 12/04/23          Patient [facility] bed at: [facility] Patient to be transferred to facility by: [facility] Name of family member notified: Daughter Patient and family notified of of transfer: 12/06/23  Discharge Plan and Services Additional resources added to the After Visit Summary for                  DME Arranged: N/A DME Agency: NA                  Social Drivers of Health (SDOH) Interventions SDOH Screenings   Food Insecurity: Patient Unable To Answer (12/01/2023)  Housing: Patient Unable To Answer (12/01/2023)  Transportation Needs: Patient Unable To Answer (12/01/2023)  Utilities: Patient Unable To Answer (12/01/2023)  Social Connections: Patient Unable To Answer (12/01/2023)  Tobacco Use: High Risk (11/29/2023)     Readmission Risk Interventions     No data to display

## 2023-12-06 NOTE — Progress Notes (Signed)
 Judene Companion, RN called from [facility]. Patient report given to Conley Rolls and Conley Rolls is aware [facility] is in route to [facility].

## 2023-12-06 NOTE — Progress Notes (Signed)
 Attempted to call report to [facility], nurse was unavailable. Left number for call back. [facility] is here to transport patient now.

## 2023-12-07 DIAGNOSIS — E568 Deficiency of other vitamins: Secondary | ICD-10-CM | POA: Diagnosis not present

## 2023-12-07 DIAGNOSIS — J984 Other disorders of lung: Secondary | ICD-10-CM | POA: Diagnosis not present

## 2023-12-07 DIAGNOSIS — I129 Hypertensive chronic kidney disease with stage 1 through stage 4 chronic kidney disease, or unspecified chronic kidney disease: Secondary | ICD-10-CM | POA: Diagnosis not present

## 2023-12-07 DIAGNOSIS — E785 Hyperlipidemia, unspecified: Secondary | ICD-10-CM | POA: Diagnosis not present

## 2023-12-07 DIAGNOSIS — N183 Chronic kidney disease, stage 3 unspecified: Secondary | ICD-10-CM | POA: Diagnosis not present

## 2023-12-07 DIAGNOSIS — E1122 Type 2 diabetes mellitus with diabetic chronic kidney disease: Secondary | ICD-10-CM | POA: Diagnosis not present

## 2023-12-07 DIAGNOSIS — E43 Unspecified severe protein-calorie malnutrition: Secondary | ICD-10-CM | POA: Diagnosis not present

## 2023-12-07 DIAGNOSIS — N401 Enlarged prostate with lower urinary tract symptoms: Secondary | ICD-10-CM | POA: Diagnosis not present

## 2023-12-07 DIAGNOSIS — N138 Other obstructive and reflux uropathy: Secondary | ICD-10-CM | POA: Diagnosis not present

## 2023-12-10 DIAGNOSIS — F03B0 Unspecified dementia, moderate, without behavioral disturbance, psychotic disturbance, mood disturbance, and anxiety: Secondary | ICD-10-CM | POA: Diagnosis not present

## 2023-12-10 DIAGNOSIS — E119 Type 2 diabetes mellitus without complications: Secondary | ICD-10-CM | POA: Diagnosis not present

## 2023-12-10 DIAGNOSIS — N138 Other obstructive and reflux uropathy: Secondary | ICD-10-CM | POA: Diagnosis not present

## 2023-12-10 DIAGNOSIS — E785 Hyperlipidemia, unspecified: Secondary | ICD-10-CM | POA: Diagnosis not present

## 2023-12-10 DIAGNOSIS — E568 Deficiency of other vitamins: Secondary | ICD-10-CM | POA: Diagnosis not present

## 2023-12-10 DIAGNOSIS — N401 Enlarged prostate with lower urinary tract symptoms: Secondary | ICD-10-CM | POA: Diagnosis not present

## 2023-12-10 DIAGNOSIS — G9340 Encephalopathy, unspecified: Secondary | ICD-10-CM | POA: Diagnosis not present

## 2023-12-10 DIAGNOSIS — I10 Essential (primary) hypertension: Secondary | ICD-10-CM | POA: Diagnosis not present

## 2023-12-10 DIAGNOSIS — N183 Chronic kidney disease, stage 3 unspecified: Secondary | ICD-10-CM | POA: Diagnosis not present

## 2023-12-10 DIAGNOSIS — J984 Other disorders of lung: Secondary | ICD-10-CM | POA: Diagnosis not present

## 2023-12-10 DIAGNOSIS — E43 Unspecified severe protein-calorie malnutrition: Secondary | ICD-10-CM | POA: Diagnosis not present

## 2023-12-11 DIAGNOSIS — E785 Hyperlipidemia, unspecified: Secondary | ICD-10-CM | POA: Diagnosis not present

## 2023-12-11 DIAGNOSIS — E43 Unspecified severe protein-calorie malnutrition: Secondary | ICD-10-CM | POA: Diagnosis not present

## 2023-12-11 DIAGNOSIS — I10 Essential (primary) hypertension: Secondary | ICD-10-CM | POA: Diagnosis not present

## 2023-12-11 DIAGNOSIS — E568 Deficiency of other vitamins: Secondary | ICD-10-CM | POA: Diagnosis not present

## 2023-12-11 DIAGNOSIS — E119 Type 2 diabetes mellitus without complications: Secondary | ICD-10-CM | POA: Diagnosis not present

## 2023-12-11 DIAGNOSIS — N401 Enlarged prostate with lower urinary tract symptoms: Secondary | ICD-10-CM | POA: Diagnosis not present

## 2023-12-11 DIAGNOSIS — N183 Chronic kidney disease, stage 3 unspecified: Secondary | ICD-10-CM | POA: Diagnosis not present

## 2023-12-11 DIAGNOSIS — N138 Other obstructive and reflux uropathy: Secondary | ICD-10-CM | POA: Diagnosis not present

## 2023-12-11 DIAGNOSIS — J984 Other disorders of lung: Secondary | ICD-10-CM | POA: Diagnosis not present

## 2023-12-12 DIAGNOSIS — N183 Chronic kidney disease, stage 3 unspecified: Secondary | ICD-10-CM | POA: Diagnosis not present

## 2023-12-12 DIAGNOSIS — N401 Enlarged prostate with lower urinary tract symptoms: Secondary | ICD-10-CM | POA: Diagnosis not present

## 2023-12-12 DIAGNOSIS — E1122 Type 2 diabetes mellitus with diabetic chronic kidney disease: Secondary | ICD-10-CM | POA: Diagnosis not present

## 2023-12-12 DIAGNOSIS — N138 Other obstructive and reflux uropathy: Secondary | ICD-10-CM | POA: Diagnosis not present

## 2023-12-12 DIAGNOSIS — J984 Other disorders of lung: Secondary | ICD-10-CM | POA: Diagnosis not present

## 2023-12-12 DIAGNOSIS — I129 Hypertensive chronic kidney disease with stage 1 through stage 4 chronic kidney disease, or unspecified chronic kidney disease: Secondary | ICD-10-CM | POA: Diagnosis not present

## 2023-12-12 DIAGNOSIS — E43 Unspecified severe protein-calorie malnutrition: Secondary | ICD-10-CM | POA: Diagnosis not present

## 2023-12-12 DIAGNOSIS — E568 Deficiency of other vitamins: Secondary | ICD-10-CM | POA: Diagnosis not present

## 2023-12-12 DIAGNOSIS — E785 Hyperlipidemia, unspecified: Secondary | ICD-10-CM | POA: Diagnosis not present

## 2023-12-14 DIAGNOSIS — E43 Unspecified severe protein-calorie malnutrition: Secondary | ICD-10-CM | POA: Diagnosis not present

## 2023-12-14 DIAGNOSIS — N401 Enlarged prostate with lower urinary tract symptoms: Secondary | ICD-10-CM | POA: Diagnosis not present

## 2023-12-14 DIAGNOSIS — J984 Other disorders of lung: Secondary | ICD-10-CM | POA: Diagnosis not present

## 2023-12-14 DIAGNOSIS — N138 Other obstructive and reflux uropathy: Secondary | ICD-10-CM | POA: Diagnosis not present

## 2023-12-14 DIAGNOSIS — E568 Deficiency of other vitamins: Secondary | ICD-10-CM | POA: Diagnosis not present

## 2023-12-14 DIAGNOSIS — N183 Chronic kidney disease, stage 3 unspecified: Secondary | ICD-10-CM | POA: Diagnosis not present

## 2023-12-14 DIAGNOSIS — E785 Hyperlipidemia, unspecified: Secondary | ICD-10-CM | POA: Diagnosis not present

## 2023-12-14 DIAGNOSIS — E119 Type 2 diabetes mellitus without complications: Secondary | ICD-10-CM | POA: Diagnosis not present

## 2023-12-14 DIAGNOSIS — I129 Hypertensive chronic kidney disease with stage 1 through stage 4 chronic kidney disease, or unspecified chronic kidney disease: Secondary | ICD-10-CM | POA: Diagnosis not present

## 2023-12-15 DIAGNOSIS — N183 Chronic kidney disease, stage 3 unspecified: Secondary | ICD-10-CM | POA: Diagnosis not present

## 2023-12-15 DIAGNOSIS — E785 Hyperlipidemia, unspecified: Secondary | ICD-10-CM | POA: Diagnosis not present

## 2023-12-15 DIAGNOSIS — N138 Other obstructive and reflux uropathy: Secondary | ICD-10-CM | POA: Diagnosis not present

## 2023-12-15 DIAGNOSIS — N401 Enlarged prostate with lower urinary tract symptoms: Secondary | ICD-10-CM | POA: Diagnosis not present

## 2023-12-15 DIAGNOSIS — I129 Hypertensive chronic kidney disease with stage 1 through stage 4 chronic kidney disease, or unspecified chronic kidney disease: Secondary | ICD-10-CM | POA: Diagnosis not present

## 2023-12-15 DIAGNOSIS — E119 Type 2 diabetes mellitus without complications: Secondary | ICD-10-CM | POA: Diagnosis not present

## 2023-12-15 DIAGNOSIS — J984 Other disorders of lung: Secondary | ICD-10-CM | POA: Diagnosis not present

## 2023-12-15 DIAGNOSIS — E43 Unspecified severe protein-calorie malnutrition: Secondary | ICD-10-CM | POA: Diagnosis not present

## 2023-12-15 DIAGNOSIS — E568 Deficiency of other vitamins: Secondary | ICD-10-CM | POA: Diagnosis not present

## 2023-12-17 DIAGNOSIS — I129 Hypertensive chronic kidney disease with stage 1 through stage 4 chronic kidney disease, or unspecified chronic kidney disease: Secondary | ICD-10-CM | POA: Diagnosis not present

## 2023-12-17 DIAGNOSIS — J984 Other disorders of lung: Secondary | ICD-10-CM | POA: Diagnosis not present

## 2023-12-17 DIAGNOSIS — E785 Hyperlipidemia, unspecified: Secondary | ICD-10-CM | POA: Diagnosis not present

## 2023-12-17 DIAGNOSIS — N401 Enlarged prostate with lower urinary tract symptoms: Secondary | ICD-10-CM | POA: Diagnosis not present

## 2023-12-17 DIAGNOSIS — N183 Chronic kidney disease, stage 3 unspecified: Secondary | ICD-10-CM | POA: Diagnosis not present

## 2023-12-17 DIAGNOSIS — N138 Other obstructive and reflux uropathy: Secondary | ICD-10-CM | POA: Diagnosis not present

## 2023-12-17 DIAGNOSIS — E43 Unspecified severe protein-calorie malnutrition: Secondary | ICD-10-CM | POA: Diagnosis not present

## 2023-12-17 DIAGNOSIS — E568 Deficiency of other vitamins: Secondary | ICD-10-CM | POA: Diagnosis not present

## 2023-12-17 DIAGNOSIS — E119 Type 2 diabetes mellitus without complications: Secondary | ICD-10-CM | POA: Diagnosis not present

## 2023-12-19 DIAGNOSIS — E568 Deficiency of other vitamins: Secondary | ICD-10-CM | POA: Diagnosis not present

## 2023-12-19 DIAGNOSIS — N401 Enlarged prostate with lower urinary tract symptoms: Secondary | ICD-10-CM | POA: Diagnosis not present

## 2023-12-19 DIAGNOSIS — J984 Other disorders of lung: Secondary | ICD-10-CM | POA: Diagnosis not present

## 2023-12-19 DIAGNOSIS — E43 Unspecified severe protein-calorie malnutrition: Secondary | ICD-10-CM | POA: Diagnosis not present

## 2023-12-19 DIAGNOSIS — E1122 Type 2 diabetes mellitus with diabetic chronic kidney disease: Secondary | ICD-10-CM | POA: Diagnosis not present

## 2023-12-19 DIAGNOSIS — E785 Hyperlipidemia, unspecified: Secondary | ICD-10-CM | POA: Diagnosis not present

## 2023-12-19 DIAGNOSIS — E119 Type 2 diabetes mellitus without complications: Secondary | ICD-10-CM | POA: Diagnosis not present

## 2023-12-19 DIAGNOSIS — N138 Other obstructive and reflux uropathy: Secondary | ICD-10-CM | POA: Diagnosis not present

## 2023-12-19 DIAGNOSIS — N183 Chronic kidney disease, stage 3 unspecified: Secondary | ICD-10-CM | POA: Diagnosis not present

## 2023-12-19 DIAGNOSIS — I129 Hypertensive chronic kidney disease with stage 1 through stage 4 chronic kidney disease, or unspecified chronic kidney disease: Secondary | ICD-10-CM | POA: Diagnosis not present

## 2023-12-21 DIAGNOSIS — N183 Chronic kidney disease, stage 3 unspecified: Secondary | ICD-10-CM | POA: Diagnosis not present

## 2023-12-21 DIAGNOSIS — N138 Other obstructive and reflux uropathy: Secondary | ICD-10-CM | POA: Diagnosis not present

## 2023-12-21 DIAGNOSIS — E119 Type 2 diabetes mellitus without complications: Secondary | ICD-10-CM | POA: Diagnosis not present

## 2023-12-21 DIAGNOSIS — N401 Enlarged prostate with lower urinary tract symptoms: Secondary | ICD-10-CM | POA: Diagnosis not present

## 2023-12-21 DIAGNOSIS — J984 Other disorders of lung: Secondary | ICD-10-CM | POA: Diagnosis not present

## 2023-12-21 DIAGNOSIS — E43 Unspecified severe protein-calorie malnutrition: Secondary | ICD-10-CM | POA: Diagnosis not present

## 2023-12-21 DIAGNOSIS — E568 Deficiency of other vitamins: Secondary | ICD-10-CM | POA: Diagnosis not present

## 2023-12-21 DIAGNOSIS — I129 Hypertensive chronic kidney disease with stage 1 through stage 4 chronic kidney disease, or unspecified chronic kidney disease: Secondary | ICD-10-CM | POA: Diagnosis not present

## 2023-12-21 DIAGNOSIS — E785 Hyperlipidemia, unspecified: Secondary | ICD-10-CM | POA: Diagnosis not present

## 2023-12-23 ENCOUNTER — Emergency Department (HOSPITAL_COMMUNITY)

## 2023-12-23 ENCOUNTER — Other Ambulatory Visit: Payer: Self-pay

## 2023-12-23 ENCOUNTER — Emergency Department (HOSPITAL_COMMUNITY): Admission: EM | Admit: 2023-12-23 | Discharge: 2023-12-24 | Disposition: A | Discharge status: Home / Self Care

## 2023-12-23 ENCOUNTER — Encounter (HOSPITAL_COMMUNITY): Payer: Self-pay

## 2023-12-23 DIAGNOSIS — I10 Essential (primary) hypertension: Secondary | ICD-10-CM | POA: Diagnosis not present

## 2023-12-23 DIAGNOSIS — E119 Type 2 diabetes mellitus without complications: Secondary | ICD-10-CM | POA: Diagnosis not present

## 2023-12-23 DIAGNOSIS — W19XXXA Unspecified fall, initial encounter: Secondary | ICD-10-CM | POA: Diagnosis not present

## 2023-12-23 DIAGNOSIS — Z79899 Other long term (current) drug therapy: Secondary | ICD-10-CM | POA: Insufficient documentation

## 2023-12-23 DIAGNOSIS — R410 Disorientation, unspecified: Secondary | ICD-10-CM | POA: Insufficient documentation

## 2023-12-23 DIAGNOSIS — R296 Repeated falls: Secondary | ICD-10-CM | POA: Diagnosis not present

## 2023-12-23 DIAGNOSIS — R4182 Altered mental status, unspecified: Secondary | ICD-10-CM | POA: Diagnosis not present

## 2023-12-23 DIAGNOSIS — M47816 Spondylosis without myelopathy or radiculopathy, lumbar region: Secondary | ICD-10-CM | POA: Diagnosis not present

## 2023-12-23 DIAGNOSIS — F0390 Unspecified dementia without behavioral disturbance: Secondary | ICD-10-CM | POA: Diagnosis not present

## 2023-12-23 DIAGNOSIS — Z7982 Long term (current) use of aspirin: Secondary | ICD-10-CM | POA: Insufficient documentation

## 2023-12-23 DIAGNOSIS — Z043 Encounter for examination and observation following other accident: Secondary | ICD-10-CM | POA: Diagnosis not present

## 2023-12-23 DIAGNOSIS — S0990XA Unspecified injury of head, initial encounter: Secondary | ICD-10-CM | POA: Diagnosis not present

## 2023-12-23 LAB — CBG MONITORING, ED: Glucose-Capillary: 87 mg/dL (ref 70–99)

## 2023-12-23 LAB — URINALYSIS, ROUTINE W REFLEX MICROSCOPIC
Bilirubin Urine: NEGATIVE
Glucose, UA: NEGATIVE mg/dL
Hgb urine dipstick: NEGATIVE
Ketones, ur: NEGATIVE mg/dL
Leukocytes,Ua: NEGATIVE
Nitrite: NEGATIVE
Protein, ur: NEGATIVE mg/dL
Specific Gravity, Urine: 1.006 (ref 1.005–1.030)
pH: 7 (ref 5.0–8.0)

## 2023-12-23 LAB — CBC
HCT: 40 % (ref 39.0–52.0)
Hemoglobin: 12.8 g/dL — ABNORMAL LOW (ref 13.0–17.0)
MCH: 29.4 pg (ref 26.0–34.0)
MCHC: 32 g/dL (ref 30.0–36.0)
MCV: 91.7 fL (ref 80.0–100.0)
Platelets: 284 10*3/uL (ref 150–400)
RBC: 4.36 MIL/uL (ref 4.22–5.81)
RDW: 12.6 % (ref 11.5–15.5)
WBC: 4.6 10*3/uL (ref 4.0–10.5)
nRBC: 0 % (ref 0.0–0.2)

## 2023-12-23 LAB — BASIC METABOLIC PANEL WITH GFR
Anion gap: 12 (ref 5–15)
BUN: 11 mg/dL (ref 8–23)
CO2: 25 mmol/L (ref 22–32)
Calcium: 8.9 mg/dL (ref 8.9–10.3)
Chloride: 104 mmol/L (ref 98–111)
Creatinine, Ser: 1.21 mg/dL (ref 0.61–1.24)
GFR, Estimated: 59 mL/min — ABNORMAL LOW (ref >60)
Glucose, Bld: 81 mg/dL (ref 70–99)
Potassium: 4 mmol/L (ref 3.5–5.1)
Sodium: 141 mmol/L (ref 135–145)

## 2023-12-23 MED ORDER — HALOPERIDOL LACTATE 5 MG/ML IJ SOLN
2.0000 mg | Freq: Once | INTRAMUSCULAR | Status: DC
  Filled 2023-12-23: qty 1

## 2023-12-23 MED ORDER — HALOPERIDOL LACTATE 5 MG/ML IJ SOLN
2.0000 mg | Freq: Once | INTRAMUSCULAR | Status: AC
  Administered 2023-12-23: 2 mg via INTRAMUSCULAR

## 2023-12-23 NOTE — Discharge Instructions (Signed)
 Please follow-up with your primary doctor.  Return immediately if develop fevers, chills, sudden onset headache, facial droop, unilateral weakness, passout, chest pain, shortness of breath or any new or worsening symptoms that are concerning to you.

## 2023-12-23 NOTE — ED Provider Notes (Signed)
 [place] EMERGENCY DEPARTMENT AT [facility] Provider Note   CSN: [id number] Arrival date & time: 12/23/23  1322     History  Chief Complaint  Patient presents with   Chase Mccarthy is a 83 y.o. male.  Presented from [facility] nursing facility for rehab who is being treated for multiple falls.  Over the past week he is had 5 falls, most recently today.  Facility does not feel that he is safe and needs memory care unit.  Patient reportedly more confused, but near baseline.  No specific complaints on my exam.   Fall       Home Medications Prior to Admission medications   Medication Sig Start Date End Date Taking? Authorizing Provider  acetaminophen (TYLENOL) 650 MG CR tablet Take 650 mg by mouth in the morning and at bedtime.    [provider]  amLODipine (NORVASC) 5 MG tablet Take 1 tablet (5 mg total) by mouth daily. 12/05/23   Akula, Vijaya, MD  aspirin EC 81 MG tablet Take 1 tablet (81 mg total) by mouth daily. Swallow whole. 01/25/23   Adhikari, Amrit, MD  cyanocobalamin (VITAMIN B12) 1000 MCG tablet Take 1 tablet (1,000 mcg total) by mouth daily. Follow with outpatient provider for follow up of B12 level. 12/06/23   Etter Hermann., MD  divalproex (DEPAKOTE SPRINKLE) 125 MG capsule Take 125 mg by mouth at bedtime.    [provider]  feeding supplement (ENSURE ENLIVE / ENSURE PLUS) LIQD Take 237 mLs by mouth 2 (two) times daily between meals. Patient not taking: Reported on 11/30/2023 01/24/23   Adhikari, Amrit, MD  finasteride (PROSCAR) 5 MG tablet Take 5 mg by mouth daily.    [provider]  melatonin 3 MG TABS tablet Take 3 mg by mouth at bedtime.    [provider]  methocarbamol (ROBAXIN) 500 MG tablet Take 500 mg by mouth daily at 12 noon.    [provider]  Multiple Vitamins-Minerals (MULTIVITAL PO) Take 1 tablet by mouth daily.    [provider]  Vitamin D, Cholecalciferol, 25 MCG (1000  UT) CAPS Take 1 tablet by mouth daily.    [provider]      Allergies    Patient has no known allergies.    Review of Systems   Review of Systems  Physical Exam Updated Vital Signs BP (!) 147/112   Pulse 72   Temp (!) 97.5 F (36.4 C) (Oral)   Resp 16   SpO2 99%  Physical Exam Vitals and nursing note reviewed.  Constitutional:      General: He is not in acute distress.    Appearance: He is not toxic-appearing.  HENT:     Head: Normocephalic.     Nose: Nose normal.     Mouth/Throat:     Mouth: Mucous membranes are moist.  Eyes:     Conjunctiva/sclera: Conjunctivae normal.     Pupils: Pupils are equal, round, and reactive to light.  Cardiovascular:     Rate and Rhythm: Normal rate.     Pulses: Normal pulses.  Pulmonary:     Effort: Pulmonary effort is normal.     Breath sounds: Normal breath sounds.  Abdominal:     General: Abdomen is flat. There is no distension.     Palpations: Abdomen is soft.     Tenderness: There is no abdominal tenderness. There is no guarding or rebound.  Musculoskeletal:  General: Normal range of motion.     Cervical back: Normal range of motion. No tenderness.  Skin:    General: Skin is warm and dry.     Capillary Refill: Capillary refill takes less than 2 seconds.  Neurological:     General: No focal deficit present.     Mental Status: He is alert.  Psychiatric:        Mood and Affect: Mood normal.        Behavior: Behavior normal.     ED Results / Procedures / Treatments   Labs (all labs ordered are listed, but only abnormal results are displayed) Labs Reviewed  CBC - Abnormal; Notable for the following components:      Result Value   Hemoglobin 12.8 (*)    All other components within normal limits  BASIC METABOLIC PANEL WITH GFR - Abnormal; Notable for the following components:   GFR, Estimated 59 (*)    All other components within normal limits  URINALYSIS, ROUTINE W REFLEX MICROSCOPIC - Abnormal; Notable  for the following components:   Color, Urine STRAW (*)    All other components within normal limits    EKG None  Radiology CT Head Wo Contrast Result Date: 12/23/2023 CLINICAL DATA:  Head trauma, minor (Age >= 65y).  Fall. EXAM: CT HEAD WITHOUT CONTRAST TECHNIQUE: Contiguous axial images were obtained from the base of the skull through the vertex without intravenous contrast. RADIATION DOSE REDUCTION: This exam was performed according to the departmental dose-optimization program which includes automated exposure control, adjustment of the mA and/or kV according to patient size and/or use of iterative reconstruction technique. COMPARISON:  Head CT 11/29/2023 and MRI 11/30/2023 FINDINGS: Brain: There is no evidence of an acute infarct, intracranial hemorrhage, mass, midline shift, or extra-axial fluid collection. Mild cerebral atrophy is within normal limits for age. Vascular: Calcified atherosclerosis at the skull base. No hyperdense vessel. Skull: No acute fracture or suspicious lesion. Sinuses/Orbits: Visualized paranasal sinuses and mastoid air cells are clear. Bilateral cataract extraction. Other: None. IMPRESSION: No evidence of acute intracranial abnormality. Electronically Signed   By: Aundra Lee M.D.   On: 12/23/2023 15:43   DG Pelvis 1-2 Views Result Date: 12/23/2023 CLINICAL DATA:  Multiple falls. EXAM: PELVIS - 1-2 VIEW COMPARISON:  11/29/2023 FINDINGS: There is no evidence of pelvic fracture or diastasis. No hip dislocation. Mild spondylitic changes in the visualized lower lumbar spine. IMPRESSION: 1. No acute findings. 2. Mild lumbar spondylosis. Electronically Signed   By: Nicoletta Barrier M.D.   On: 12/23/2023 14:07   DG Chest Portable 1 View Result Date: 12/23/2023 CLINICAL DATA:  Multiple falls EXAM: PORTABLE CHEST - 1 VIEW COMPARISON:  11/29/2023 FINDINGS: Lungs are clear.  No pneumothorax. Heart size and mediastinal contours are within normal limits. Aortic Atherosclerosis  (ICD10-170.0). No effusion. Visualized bones unremarkable. IMPRESSION: No acute cardiopulmonary disease. Electronically Signed   By: Nicoletta Barrier M.D.   On: 12/23/2023 14:06    Procedures Procedures    Medications Ordered in ED Medications  haloperidol lactate (HALDOL) injection 2 mg (2 mg Intramuscular Given 12/23/23 1812)    ED Course/ Medical Decision Making/ A&P                                 Medical Decision Making This is an 83 year old male with hypertension diabetes, dementia.  Recent hospitalization per chart review for encephalopathy thought to be secondary to dehydration.  Multiple falls  over the past week.  Does not appear to be in distress or have obvious signs of trauma.  No blood thinner my review of chart.  Facility sent for evaluation and possible placement to memory care unit.  Will get basic labs as he is reported mildly more confused than normal.  Will also get CT head, chest x-ray and pelvis x-ray given unreliable historian and reported falls.  Update MDM.  Workup negative for trauma.  No metabolic derangements.  No infectious etiology identified.  No leukocytosis on CBC.  Chest x-ray without pneumonia.  Also no traumatic injury.  Wife present at bedside state patient is essentially at his baseline.  Stable for discharge back to facility.  Amount and/or Complexity of Data Reviewed Independent Historian:     Details: Stable vitals around External Data Reviewed:     Details: See above Labs: ordered. Decision-making details documented in ED Course. Radiology: ordered. Decision-making details documented in ED Course. ECG/medicine tests:  Decision-making details documented in ED Course.  Risk Prescription drug management. Decision regarding hospitalization.         Final Clinical Impression(s) / ED Diagnoses Final diagnoses:  Fall, initial encounter    Rx / DC Orders ED Discharge Orders     None         Rolinda Climes, DO 12/23/23 1817

## 2023-12-23 NOTE — ED Triage Notes (Signed)
 Pt BIB [facility] from [facility] for rehab from an assisted living after having multiple falls. Antonia Battiest states in the last 7 days he has had 5 falls. Pt does have a hx of dementia but seems to be more confused then baseline. At baseline he is alert to self. Antonia Battiest did give a dose of haldol this AM.

## 2023-12-23 NOTE — ED Notes (Signed)
 Pt moved to  room 2 for better visibility one  seizure pad placed unablt to find any others  lads on thje floor   in case he gets over the rails

## 2023-12-23 NOTE — ED Notes (Signed)
 Pt had feet over the side rails he is not following commands no tech help today I have requested a Recruitment consultant

## 2023-12-23 NOTE — ED Notes (Signed)
 The pt has been in constant motion for hours he keeps attempting to get off the bed still waiting for  [facility] to take him  back to [facility]

## 2023-12-23 NOTE — ED Notes (Signed)
 Daughter at the bedside for the past hour  she fed him  some ice cream

## 2023-12-23 NOTE — ED Notes (Signed)
 The pt is going back to [facility]  report attempted  report given to [facility]

## 2023-12-24 DIAGNOSIS — I129 Hypertensive chronic kidney disease with stage 1 through stage 4 chronic kidney disease, or unspecified chronic kidney disease: Secondary | ICD-10-CM | POA: Diagnosis not present

## 2023-12-24 DIAGNOSIS — J984 Other disorders of lung: Secondary | ICD-10-CM | POA: Diagnosis not present

## 2023-12-24 DIAGNOSIS — E43 Unspecified severe protein-calorie malnutrition: Secondary | ICD-10-CM | POA: Diagnosis not present

## 2023-12-24 DIAGNOSIS — N183 Chronic kidney disease, stage 3 unspecified: Secondary | ICD-10-CM | POA: Diagnosis not present

## 2023-12-24 DIAGNOSIS — E119 Type 2 diabetes mellitus without complications: Secondary | ICD-10-CM | POA: Diagnosis not present

## 2023-12-24 DIAGNOSIS — Z515 Encounter for palliative care: Secondary | ICD-10-CM | POA: Diagnosis not present

## 2023-12-24 DIAGNOSIS — N401 Enlarged prostate with lower urinary tract symptoms: Secondary | ICD-10-CM | POA: Diagnosis not present

## 2023-12-24 DIAGNOSIS — E785 Hyperlipidemia, unspecified: Secondary | ICD-10-CM | POA: Diagnosis not present

## 2023-12-24 DIAGNOSIS — E568 Deficiency of other vitamins: Secondary | ICD-10-CM | POA: Diagnosis not present

## 2023-12-24 DIAGNOSIS — N138 Other obstructive and reflux uropathy: Secondary | ICD-10-CM | POA: Diagnosis not present

## 2023-12-26 DIAGNOSIS — I129 Hypertensive chronic kidney disease with stage 1 through stage 4 chronic kidney disease, or unspecified chronic kidney disease: Secondary | ICD-10-CM | POA: Diagnosis not present

## 2023-12-26 DIAGNOSIS — N183 Chronic kidney disease, stage 3 unspecified: Secondary | ICD-10-CM | POA: Diagnosis not present

## 2023-12-26 DIAGNOSIS — E785 Hyperlipidemia, unspecified: Secondary | ICD-10-CM | POA: Diagnosis not present

## 2023-12-26 DIAGNOSIS — E119 Type 2 diabetes mellitus without complications: Secondary | ICD-10-CM | POA: Diagnosis not present

## 2023-12-26 DIAGNOSIS — E568 Deficiency of other vitamins: Secondary | ICD-10-CM | POA: Diagnosis not present

## 2023-12-26 DIAGNOSIS — N401 Enlarged prostate with lower urinary tract symptoms: Secondary | ICD-10-CM | POA: Diagnosis not present

## 2023-12-26 DIAGNOSIS — E43 Unspecified severe protein-calorie malnutrition: Secondary | ICD-10-CM | POA: Diagnosis not present

## 2023-12-26 DIAGNOSIS — N138 Other obstructive and reflux uropathy: Secondary | ICD-10-CM | POA: Diagnosis not present

## 2023-12-26 DIAGNOSIS — J984 Other disorders of lung: Secondary | ICD-10-CM | POA: Diagnosis not present

## 2023-12-27 DIAGNOSIS — E43 Unspecified severe protein-calorie malnutrition: Secondary | ICD-10-CM | POA: Diagnosis not present

## 2023-12-27 DIAGNOSIS — R059 Cough, unspecified: Secondary | ICD-10-CM | POA: Diagnosis not present

## 2023-12-27 DIAGNOSIS — E568 Deficiency of other vitamins: Secondary | ICD-10-CM | POA: Diagnosis not present

## 2023-12-27 DIAGNOSIS — N401 Enlarged prostate with lower urinary tract symptoms: Secondary | ICD-10-CM | POA: Diagnosis not present

## 2023-12-27 DIAGNOSIS — N183 Chronic kidney disease, stage 3 unspecified: Secondary | ICD-10-CM | POA: Diagnosis not present

## 2023-12-27 DIAGNOSIS — E785 Hyperlipidemia, unspecified: Secondary | ICD-10-CM | POA: Diagnosis not present

## 2023-12-27 DIAGNOSIS — N138 Other obstructive and reflux uropathy: Secondary | ICD-10-CM | POA: Diagnosis not present

## 2023-12-27 DIAGNOSIS — I10 Essential (primary) hypertension: Secondary | ICD-10-CM | POA: Diagnosis not present

## 2023-12-27 DIAGNOSIS — E119 Type 2 diabetes mellitus without complications: Secondary | ICD-10-CM | POA: Diagnosis not present

## 2023-12-27 DIAGNOSIS — J984 Other disorders of lung: Secondary | ICD-10-CM | POA: Diagnosis not present

## 2023-12-28 DIAGNOSIS — N138 Other obstructive and reflux uropathy: Secondary | ICD-10-CM | POA: Diagnosis not present

## 2023-12-28 DIAGNOSIS — J984 Other disorders of lung: Secondary | ICD-10-CM | POA: Diagnosis not present

## 2023-12-28 DIAGNOSIS — E119 Type 2 diabetes mellitus without complications: Secondary | ICD-10-CM | POA: Diagnosis not present

## 2023-12-28 DIAGNOSIS — N401 Enlarged prostate with lower urinary tract symptoms: Secondary | ICD-10-CM | POA: Diagnosis not present

## 2023-12-28 DIAGNOSIS — E785 Hyperlipidemia, unspecified: Secondary | ICD-10-CM | POA: Diagnosis not present

## 2023-12-28 DIAGNOSIS — E568 Deficiency of other vitamins: Secondary | ICD-10-CM | POA: Diagnosis not present

## 2023-12-28 DIAGNOSIS — N183 Chronic kidney disease, stage 3 unspecified: Secondary | ICD-10-CM | POA: Diagnosis not present

## 2023-12-28 DIAGNOSIS — E43 Unspecified severe protein-calorie malnutrition: Secondary | ICD-10-CM | POA: Diagnosis not present

## 2023-12-28 DIAGNOSIS — I10 Essential (primary) hypertension: Secondary | ICD-10-CM | POA: Diagnosis not present

## 2023-12-30 DIAGNOSIS — M6281 Muscle weakness (generalized): Secondary | ICD-10-CM | POA: Diagnosis not present

## 2023-12-30 DIAGNOSIS — G9340 Encephalopathy, unspecified: Secondary | ICD-10-CM | POA: Diagnosis not present

## 2023-12-30 DIAGNOSIS — R41841 Cognitive communication deficit: Secondary | ICD-10-CM | POA: Diagnosis not present

## 2023-12-30 DIAGNOSIS — R1311 Dysphagia, oral phase: Secondary | ICD-10-CM | POA: Diagnosis not present

## 2023-12-31 DIAGNOSIS — I129 Hypertensive chronic kidney disease with stage 1 through stage 4 chronic kidney disease, or unspecified chronic kidney disease: Secondary | ICD-10-CM | POA: Diagnosis not present

## 2023-12-31 DIAGNOSIS — N138 Other obstructive and reflux uropathy: Secondary | ICD-10-CM | POA: Diagnosis not present

## 2023-12-31 DIAGNOSIS — J984 Other disorders of lung: Secondary | ICD-10-CM | POA: Diagnosis not present

## 2023-12-31 DIAGNOSIS — E785 Hyperlipidemia, unspecified: Secondary | ICD-10-CM | POA: Diagnosis not present

## 2023-12-31 DIAGNOSIS — N401 Enlarged prostate with lower urinary tract symptoms: Secondary | ICD-10-CM | POA: Diagnosis not present

## 2023-12-31 DIAGNOSIS — R1311 Dysphagia, oral phase: Secondary | ICD-10-CM | POA: Diagnosis not present

## 2023-12-31 DIAGNOSIS — R41841 Cognitive communication deficit: Secondary | ICD-10-CM | POA: Diagnosis not present

## 2023-12-31 DIAGNOSIS — E568 Deficiency of other vitamins: Secondary | ICD-10-CM | POA: Diagnosis not present

## 2023-12-31 DIAGNOSIS — N183 Chronic kidney disease, stage 3 unspecified: Secondary | ICD-10-CM | POA: Diagnosis not present

## 2023-12-31 DIAGNOSIS — M6281 Muscle weakness (generalized): Secondary | ICD-10-CM | POA: Diagnosis not present

## 2023-12-31 DIAGNOSIS — E43 Unspecified severe protein-calorie malnutrition: Secondary | ICD-10-CM | POA: Diagnosis not present

## 2023-12-31 DIAGNOSIS — E119 Type 2 diabetes mellitus without complications: Secondary | ICD-10-CM | POA: Diagnosis not present

## 2023-12-31 DIAGNOSIS — G9340 Encephalopathy, unspecified: Secondary | ICD-10-CM | POA: Diagnosis not present

## 2024-01-01 DIAGNOSIS — I129 Hypertensive chronic kidney disease with stage 1 through stage 4 chronic kidney disease, or unspecified chronic kidney disease: Secondary | ICD-10-CM | POA: Diagnosis not present

## 2024-01-01 DIAGNOSIS — N138 Other obstructive and reflux uropathy: Secondary | ICD-10-CM | POA: Diagnosis not present

## 2024-01-01 DIAGNOSIS — E785 Hyperlipidemia, unspecified: Secondary | ICD-10-CM | POA: Diagnosis not present

## 2024-01-01 DIAGNOSIS — E119 Type 2 diabetes mellitus without complications: Secondary | ICD-10-CM | POA: Diagnosis not present

## 2024-01-01 DIAGNOSIS — N401 Enlarged prostate with lower urinary tract symptoms: Secondary | ICD-10-CM | POA: Diagnosis not present

## 2024-01-01 DIAGNOSIS — G9340 Encephalopathy, unspecified: Secondary | ICD-10-CM | POA: Diagnosis not present

## 2024-01-01 DIAGNOSIS — N183 Chronic kidney disease, stage 3 unspecified: Secondary | ICD-10-CM | POA: Diagnosis not present

## 2024-01-01 DIAGNOSIS — E568 Deficiency of other vitamins: Secondary | ICD-10-CM | POA: Diagnosis not present

## 2024-01-01 DIAGNOSIS — R41841 Cognitive communication deficit: Secondary | ICD-10-CM | POA: Diagnosis not present

## 2024-01-01 DIAGNOSIS — J984 Other disorders of lung: Secondary | ICD-10-CM | POA: Diagnosis not present

## 2024-01-01 DIAGNOSIS — M6281 Muscle weakness (generalized): Secondary | ICD-10-CM | POA: Diagnosis not present

## 2024-01-01 DIAGNOSIS — E43 Unspecified severe protein-calorie malnutrition: Secondary | ICD-10-CM | POA: Diagnosis not present

## 2024-01-01 DIAGNOSIS — R1311 Dysphagia, oral phase: Secondary | ICD-10-CM | POA: Diagnosis not present

## 2024-01-02 DIAGNOSIS — N401 Enlarged prostate with lower urinary tract symptoms: Secondary | ICD-10-CM | POA: Diagnosis not present

## 2024-01-02 DIAGNOSIS — M6281 Muscle weakness (generalized): Secondary | ICD-10-CM | POA: Diagnosis not present

## 2024-01-02 DIAGNOSIS — R41841 Cognitive communication deficit: Secondary | ICD-10-CM | POA: Diagnosis not present

## 2024-01-02 DIAGNOSIS — E568 Deficiency of other vitamins: Secondary | ICD-10-CM | POA: Diagnosis not present

## 2024-01-02 DIAGNOSIS — E119 Type 2 diabetes mellitus without complications: Secondary | ICD-10-CM | POA: Diagnosis not present

## 2024-01-02 DIAGNOSIS — E785 Hyperlipidemia, unspecified: Secondary | ICD-10-CM | POA: Diagnosis not present

## 2024-01-02 DIAGNOSIS — E43 Unspecified severe protein-calorie malnutrition: Secondary | ICD-10-CM | POA: Diagnosis not present

## 2024-01-02 DIAGNOSIS — N138 Other obstructive and reflux uropathy: Secondary | ICD-10-CM | POA: Diagnosis not present

## 2024-01-02 DIAGNOSIS — I129 Hypertensive chronic kidney disease with stage 1 through stage 4 chronic kidney disease, or unspecified chronic kidney disease: Secondary | ICD-10-CM | POA: Diagnosis not present

## 2024-01-02 DIAGNOSIS — G9340 Encephalopathy, unspecified: Secondary | ICD-10-CM | POA: Diagnosis not present

## 2024-01-02 DIAGNOSIS — R1311 Dysphagia, oral phase: Secondary | ICD-10-CM | POA: Diagnosis not present

## 2024-01-02 DIAGNOSIS — J984 Other disorders of lung: Secondary | ICD-10-CM | POA: Diagnosis not present

## 2024-01-02 DIAGNOSIS — N183 Chronic kidney disease, stage 3 unspecified: Secondary | ICD-10-CM | POA: Diagnosis not present

## 2024-01-03 DIAGNOSIS — R41841 Cognitive communication deficit: Secondary | ICD-10-CM | POA: Diagnosis not present

## 2024-01-03 DIAGNOSIS — G9340 Encephalopathy, unspecified: Secondary | ICD-10-CM | POA: Diagnosis not present

## 2024-01-03 DIAGNOSIS — M6281 Muscle weakness (generalized): Secondary | ICD-10-CM | POA: Diagnosis not present

## 2024-01-03 DIAGNOSIS — R1311 Dysphagia, oral phase: Secondary | ICD-10-CM | POA: Diagnosis not present

## 2024-01-04 DIAGNOSIS — M6281 Muscle weakness (generalized): Secondary | ICD-10-CM | POA: Diagnosis not present

## 2024-01-04 DIAGNOSIS — R41841 Cognitive communication deficit: Secondary | ICD-10-CM | POA: Diagnosis not present

## 2024-01-04 DIAGNOSIS — N401 Enlarged prostate with lower urinary tract symptoms: Secondary | ICD-10-CM | POA: Diagnosis not present

## 2024-01-04 DIAGNOSIS — R1311 Dysphagia, oral phase: Secondary | ICD-10-CM | POA: Diagnosis not present

## 2024-01-04 DIAGNOSIS — E568 Deficiency of other vitamins: Secondary | ICD-10-CM | POA: Diagnosis not present

## 2024-01-04 DIAGNOSIS — G9340 Encephalopathy, unspecified: Secondary | ICD-10-CM | POA: Diagnosis not present

## 2024-01-04 DIAGNOSIS — N183 Chronic kidney disease, stage 3 unspecified: Secondary | ICD-10-CM | POA: Diagnosis not present

## 2024-01-04 DIAGNOSIS — E43 Unspecified severe protein-calorie malnutrition: Secondary | ICD-10-CM | POA: Diagnosis not present

## 2024-01-04 DIAGNOSIS — N138 Other obstructive and reflux uropathy: Secondary | ICD-10-CM | POA: Diagnosis not present

## 2024-01-04 DIAGNOSIS — I129 Hypertensive chronic kidney disease with stage 1 through stage 4 chronic kidney disease, or unspecified chronic kidney disease: Secondary | ICD-10-CM | POA: Diagnosis not present

## 2024-01-04 DIAGNOSIS — E119 Type 2 diabetes mellitus without complications: Secondary | ICD-10-CM | POA: Diagnosis not present

## 2024-01-04 DIAGNOSIS — E785 Hyperlipidemia, unspecified: Secondary | ICD-10-CM | POA: Diagnosis not present

## 2024-01-04 DIAGNOSIS — J984 Other disorders of lung: Secondary | ICD-10-CM | POA: Diagnosis not present

## 2024-01-07 DIAGNOSIS — E568 Deficiency of other vitamins: Secondary | ICD-10-CM | POA: Diagnosis not present

## 2024-01-07 DIAGNOSIS — E785 Hyperlipidemia, unspecified: Secondary | ICD-10-CM | POA: Diagnosis not present

## 2024-01-07 DIAGNOSIS — N183 Chronic kidney disease, stage 3 unspecified: Secondary | ICD-10-CM | POA: Diagnosis not present

## 2024-01-07 DIAGNOSIS — I129 Hypertensive chronic kidney disease with stage 1 through stage 4 chronic kidney disease, or unspecified chronic kidney disease: Secondary | ICD-10-CM | POA: Diagnosis not present

## 2024-01-07 DIAGNOSIS — N138 Other obstructive and reflux uropathy: Secondary | ICD-10-CM | POA: Diagnosis not present

## 2024-01-07 DIAGNOSIS — E43 Unspecified severe protein-calorie malnutrition: Secondary | ICD-10-CM | POA: Diagnosis not present

## 2024-01-07 DIAGNOSIS — N401 Enlarged prostate with lower urinary tract symptoms: Secondary | ICD-10-CM | POA: Diagnosis not present

## 2024-01-07 DIAGNOSIS — E119 Type 2 diabetes mellitus without complications: Secondary | ICD-10-CM | POA: Diagnosis not present

## 2024-01-07 DIAGNOSIS — J984 Other disorders of lung: Secondary | ICD-10-CM | POA: Diagnosis not present

## 2024-01-08 DIAGNOSIS — R1311 Dysphagia, oral phase: Secondary | ICD-10-CM | POA: Diagnosis not present

## 2024-01-08 DIAGNOSIS — E568 Deficiency of other vitamins: Secondary | ICD-10-CM | POA: Diagnosis not present

## 2024-01-08 DIAGNOSIS — N401 Enlarged prostate with lower urinary tract symptoms: Secondary | ICD-10-CM | POA: Diagnosis not present

## 2024-01-08 DIAGNOSIS — E785 Hyperlipidemia, unspecified: Secondary | ICD-10-CM | POA: Diagnosis not present

## 2024-01-08 DIAGNOSIS — N183 Chronic kidney disease, stage 3 unspecified: Secondary | ICD-10-CM | POA: Diagnosis not present

## 2024-01-08 DIAGNOSIS — E43 Unspecified severe protein-calorie malnutrition: Secondary | ICD-10-CM | POA: Diagnosis not present

## 2024-01-08 DIAGNOSIS — J984 Other disorders of lung: Secondary | ICD-10-CM | POA: Diagnosis not present

## 2024-01-08 DIAGNOSIS — E119 Type 2 diabetes mellitus without complications: Secondary | ICD-10-CM | POA: Diagnosis not present

## 2024-01-08 DIAGNOSIS — R41841 Cognitive communication deficit: Secondary | ICD-10-CM | POA: Diagnosis not present

## 2024-01-08 DIAGNOSIS — I10 Essential (primary) hypertension: Secondary | ICD-10-CM | POA: Diagnosis not present

## 2024-01-08 DIAGNOSIS — N138 Other obstructive and reflux uropathy: Secondary | ICD-10-CM | POA: Diagnosis not present

## 2024-01-08 DIAGNOSIS — G9340 Encephalopathy, unspecified: Secondary | ICD-10-CM | POA: Diagnosis not present

## 2024-01-08 DIAGNOSIS — M6281 Muscle weakness (generalized): Secondary | ICD-10-CM | POA: Diagnosis not present

## 2024-01-09 DIAGNOSIS — N138 Other obstructive and reflux uropathy: Secondary | ICD-10-CM | POA: Diagnosis not present

## 2024-01-09 DIAGNOSIS — E119 Type 2 diabetes mellitus without complications: Secondary | ICD-10-CM | POA: Diagnosis not present

## 2024-01-09 DIAGNOSIS — R1311 Dysphagia, oral phase: Secondary | ICD-10-CM | POA: Diagnosis not present

## 2024-01-09 DIAGNOSIS — N401 Enlarged prostate with lower urinary tract symptoms: Secondary | ICD-10-CM | POA: Diagnosis not present

## 2024-01-09 DIAGNOSIS — N183 Chronic kidney disease, stage 3 unspecified: Secondary | ICD-10-CM | POA: Diagnosis not present

## 2024-01-09 DIAGNOSIS — M6281 Muscle weakness (generalized): Secondary | ICD-10-CM | POA: Diagnosis not present

## 2024-01-09 DIAGNOSIS — E785 Hyperlipidemia, unspecified: Secondary | ICD-10-CM | POA: Diagnosis not present

## 2024-01-09 DIAGNOSIS — R41841 Cognitive communication deficit: Secondary | ICD-10-CM | POA: Diagnosis not present

## 2024-01-09 DIAGNOSIS — G9340 Encephalopathy, unspecified: Secondary | ICD-10-CM | POA: Diagnosis not present

## 2024-01-09 DIAGNOSIS — E568 Deficiency of other vitamins: Secondary | ICD-10-CM | POA: Diagnosis not present

## 2024-01-09 DIAGNOSIS — E43 Unspecified severe protein-calorie malnutrition: Secondary | ICD-10-CM | POA: Diagnosis not present

## 2024-01-09 DIAGNOSIS — J984 Other disorders of lung: Secondary | ICD-10-CM | POA: Diagnosis not present

## 2024-01-09 DIAGNOSIS — I129 Hypertensive chronic kidney disease with stage 1 through stage 4 chronic kidney disease, or unspecified chronic kidney disease: Secondary | ICD-10-CM | POA: Diagnosis not present

## 2024-01-10 DIAGNOSIS — E43 Unspecified severe protein-calorie malnutrition: Secondary | ICD-10-CM | POA: Diagnosis not present

## 2024-01-10 DIAGNOSIS — J984 Other disorders of lung: Secondary | ICD-10-CM | POA: Diagnosis not present

## 2024-01-10 DIAGNOSIS — I129 Hypertensive chronic kidney disease with stage 1 through stage 4 chronic kidney disease, or unspecified chronic kidney disease: Secondary | ICD-10-CM | POA: Diagnosis not present

## 2024-01-10 DIAGNOSIS — E568 Deficiency of other vitamins: Secondary | ICD-10-CM | POA: Diagnosis not present

## 2024-01-10 DIAGNOSIS — N138 Other obstructive and reflux uropathy: Secondary | ICD-10-CM | POA: Diagnosis not present

## 2024-01-10 DIAGNOSIS — N183 Chronic kidney disease, stage 3 unspecified: Secondary | ICD-10-CM | POA: Diagnosis not present

## 2024-01-10 DIAGNOSIS — M6281 Muscle weakness (generalized): Secondary | ICD-10-CM | POA: Diagnosis not present

## 2024-01-10 DIAGNOSIS — E785 Hyperlipidemia, unspecified: Secondary | ICD-10-CM | POA: Diagnosis not present

## 2024-01-10 DIAGNOSIS — E119 Type 2 diabetes mellitus without complications: Secondary | ICD-10-CM | POA: Diagnosis not present

## 2024-01-10 DIAGNOSIS — N401 Enlarged prostate with lower urinary tract symptoms: Secondary | ICD-10-CM | POA: Diagnosis not present

## 2024-01-10 DIAGNOSIS — G9340 Encephalopathy, unspecified: Secondary | ICD-10-CM | POA: Diagnosis not present

## 2024-01-10 DIAGNOSIS — R1311 Dysphagia, oral phase: Secondary | ICD-10-CM | POA: Diagnosis not present

## 2024-01-10 DIAGNOSIS — R41841 Cognitive communication deficit: Secondary | ICD-10-CM | POA: Diagnosis not present

## 2024-01-11 DIAGNOSIS — R41841 Cognitive communication deficit: Secondary | ICD-10-CM | POA: Diagnosis not present

## 2024-01-11 DIAGNOSIS — N138 Other obstructive and reflux uropathy: Secondary | ICD-10-CM | POA: Diagnosis not present

## 2024-01-11 DIAGNOSIS — M6281 Muscle weakness (generalized): Secondary | ICD-10-CM | POA: Diagnosis not present

## 2024-01-11 DIAGNOSIS — J984 Other disorders of lung: Secondary | ICD-10-CM | POA: Diagnosis not present

## 2024-01-11 DIAGNOSIS — N401 Enlarged prostate with lower urinary tract symptoms: Secondary | ICD-10-CM | POA: Diagnosis not present

## 2024-01-11 DIAGNOSIS — E785 Hyperlipidemia, unspecified: Secondary | ICD-10-CM | POA: Diagnosis not present

## 2024-01-11 DIAGNOSIS — G9340 Encephalopathy, unspecified: Secondary | ICD-10-CM | POA: Diagnosis not present

## 2024-01-11 DIAGNOSIS — E568 Deficiency of other vitamins: Secondary | ICD-10-CM | POA: Diagnosis not present

## 2024-01-11 DIAGNOSIS — E1122 Type 2 diabetes mellitus with diabetic chronic kidney disease: Secondary | ICD-10-CM | POA: Diagnosis not present

## 2024-01-11 DIAGNOSIS — E43 Unspecified severe protein-calorie malnutrition: Secondary | ICD-10-CM | POA: Diagnosis not present

## 2024-01-11 DIAGNOSIS — I129 Hypertensive chronic kidney disease with stage 1 through stage 4 chronic kidney disease, or unspecified chronic kidney disease: Secondary | ICD-10-CM | POA: Diagnosis not present

## 2024-01-11 DIAGNOSIS — N183 Chronic kidney disease, stage 3 unspecified: Secondary | ICD-10-CM | POA: Diagnosis not present

## 2024-01-11 DIAGNOSIS — R1311 Dysphagia, oral phase: Secondary | ICD-10-CM | POA: Diagnosis not present

## 2024-01-12 DIAGNOSIS — G9340 Encephalopathy, unspecified: Secondary | ICD-10-CM | POA: Diagnosis not present

## 2024-01-12 DIAGNOSIS — R1311 Dysphagia, oral phase: Secondary | ICD-10-CM | POA: Diagnosis not present

## 2024-01-12 DIAGNOSIS — M6281 Muscle weakness (generalized): Secondary | ICD-10-CM | POA: Diagnosis not present

## 2024-01-14 DIAGNOSIS — N183 Chronic kidney disease, stage 3 unspecified: Secondary | ICD-10-CM | POA: Diagnosis not present

## 2024-01-14 DIAGNOSIS — I129 Hypertensive chronic kidney disease with stage 1 through stage 4 chronic kidney disease, or unspecified chronic kidney disease: Secondary | ICD-10-CM | POA: Diagnosis not present

## 2024-01-14 DIAGNOSIS — E785 Hyperlipidemia, unspecified: Secondary | ICD-10-CM | POA: Diagnosis not present

## 2024-01-14 DIAGNOSIS — N138 Other obstructive and reflux uropathy: Secondary | ICD-10-CM | POA: Diagnosis not present

## 2024-01-14 DIAGNOSIS — R1311 Dysphagia, oral phase: Secondary | ICD-10-CM | POA: Diagnosis not present

## 2024-01-14 DIAGNOSIS — J984 Other disorders of lung: Secondary | ICD-10-CM | POA: Diagnosis not present

## 2024-01-14 DIAGNOSIS — E43 Unspecified severe protein-calorie malnutrition: Secondary | ICD-10-CM | POA: Diagnosis not present

## 2024-01-14 DIAGNOSIS — G9340 Encephalopathy, unspecified: Secondary | ICD-10-CM | POA: Diagnosis not present

## 2024-01-14 DIAGNOSIS — N401 Enlarged prostate with lower urinary tract symptoms: Secondary | ICD-10-CM | POA: Diagnosis not present

## 2024-01-14 DIAGNOSIS — M6281 Muscle weakness (generalized): Secondary | ICD-10-CM | POA: Diagnosis not present

## 2024-01-14 DIAGNOSIS — E119 Type 2 diabetes mellitus without complications: Secondary | ICD-10-CM | POA: Diagnosis not present

## 2024-01-14 DIAGNOSIS — R41841 Cognitive communication deficit: Secondary | ICD-10-CM | POA: Diagnosis not present

## 2024-01-14 DIAGNOSIS — E568 Deficiency of other vitamins: Secondary | ICD-10-CM | POA: Diagnosis not present

## 2024-01-15 DIAGNOSIS — M6281 Muscle weakness (generalized): Secondary | ICD-10-CM | POA: Diagnosis not present

## 2024-01-15 DIAGNOSIS — R1311 Dysphagia, oral phase: Secondary | ICD-10-CM | POA: Diagnosis not present

## 2024-01-15 DIAGNOSIS — G9340 Encephalopathy, unspecified: Secondary | ICD-10-CM | POA: Diagnosis not present

## 2024-01-15 DIAGNOSIS — R41841 Cognitive communication deficit: Secondary | ICD-10-CM | POA: Diagnosis not present

## 2024-01-16 DIAGNOSIS — M6281 Muscle weakness (generalized): Secondary | ICD-10-CM | POA: Diagnosis not present

## 2024-01-16 DIAGNOSIS — J984 Other disorders of lung: Secondary | ICD-10-CM | POA: Diagnosis not present

## 2024-01-16 DIAGNOSIS — E43 Unspecified severe protein-calorie malnutrition: Secondary | ICD-10-CM | POA: Diagnosis not present

## 2024-01-16 DIAGNOSIS — N138 Other obstructive and reflux uropathy: Secondary | ICD-10-CM | POA: Diagnosis not present

## 2024-01-16 DIAGNOSIS — G9340 Encephalopathy, unspecified: Secondary | ICD-10-CM | POA: Diagnosis not present

## 2024-01-16 DIAGNOSIS — R1311 Dysphagia, oral phase: Secondary | ICD-10-CM | POA: Diagnosis not present

## 2024-01-16 DIAGNOSIS — N401 Enlarged prostate with lower urinary tract symptoms: Secondary | ICD-10-CM | POA: Diagnosis not present

## 2024-01-16 DIAGNOSIS — E119 Type 2 diabetes mellitus without complications: Secondary | ICD-10-CM | POA: Diagnosis not present

## 2024-01-16 DIAGNOSIS — E785 Hyperlipidemia, unspecified: Secondary | ICD-10-CM | POA: Diagnosis not present

## 2024-01-16 DIAGNOSIS — E568 Deficiency of other vitamins: Secondary | ICD-10-CM | POA: Diagnosis not present

## 2024-01-16 DIAGNOSIS — I129 Hypertensive chronic kidney disease with stage 1 through stage 4 chronic kidney disease, or unspecified chronic kidney disease: Secondary | ICD-10-CM | POA: Diagnosis not present

## 2024-01-16 DIAGNOSIS — N183 Chronic kidney disease, stage 3 unspecified: Secondary | ICD-10-CM | POA: Diagnosis not present

## 2024-01-16 DIAGNOSIS — R41841 Cognitive communication deficit: Secondary | ICD-10-CM | POA: Diagnosis not present

## 2024-01-17 DIAGNOSIS — N401 Enlarged prostate with lower urinary tract symptoms: Secondary | ICD-10-CM | POA: Diagnosis not present

## 2024-01-17 DIAGNOSIS — E43 Unspecified severe protein-calorie malnutrition: Secondary | ICD-10-CM | POA: Diagnosis not present

## 2024-01-17 DIAGNOSIS — J984 Other disorders of lung: Secondary | ICD-10-CM | POA: Diagnosis not present

## 2024-01-17 DIAGNOSIS — M6281 Muscle weakness (generalized): Secondary | ICD-10-CM | POA: Diagnosis not present

## 2024-01-17 DIAGNOSIS — N138 Other obstructive and reflux uropathy: Secondary | ICD-10-CM | POA: Diagnosis not present

## 2024-01-17 DIAGNOSIS — E785 Hyperlipidemia, unspecified: Secondary | ICD-10-CM | POA: Diagnosis not present

## 2024-01-17 DIAGNOSIS — N183 Chronic kidney disease, stage 3 unspecified: Secondary | ICD-10-CM | POA: Diagnosis not present

## 2024-01-17 DIAGNOSIS — R41841 Cognitive communication deficit: Secondary | ICD-10-CM | POA: Diagnosis not present

## 2024-01-17 DIAGNOSIS — R1311 Dysphagia, oral phase: Secondary | ICD-10-CM | POA: Diagnosis not present

## 2024-01-17 DIAGNOSIS — G9340 Encephalopathy, unspecified: Secondary | ICD-10-CM | POA: Diagnosis not present

## 2024-01-17 DIAGNOSIS — E568 Deficiency of other vitamins: Secondary | ICD-10-CM | POA: Diagnosis not present

## 2024-01-17 DIAGNOSIS — E119 Type 2 diabetes mellitus without complications: Secondary | ICD-10-CM | POA: Diagnosis not present

## 2024-01-17 DIAGNOSIS — I129 Hypertensive chronic kidney disease with stage 1 through stage 4 chronic kidney disease, or unspecified chronic kidney disease: Secondary | ICD-10-CM | POA: Diagnosis not present

## 2024-01-18 DIAGNOSIS — M6281 Muscle weakness (generalized): Secondary | ICD-10-CM | POA: Diagnosis not present

## 2024-01-18 DIAGNOSIS — E43 Unspecified severe protein-calorie malnutrition: Secondary | ICD-10-CM | POA: Diagnosis not present

## 2024-01-18 DIAGNOSIS — R1311 Dysphagia, oral phase: Secondary | ICD-10-CM | POA: Diagnosis not present

## 2024-01-18 DIAGNOSIS — E119 Type 2 diabetes mellitus without complications: Secondary | ICD-10-CM | POA: Diagnosis not present

## 2024-01-18 DIAGNOSIS — E568 Deficiency of other vitamins: Secondary | ICD-10-CM | POA: Diagnosis not present

## 2024-01-18 DIAGNOSIS — R41841 Cognitive communication deficit: Secondary | ICD-10-CM | POA: Diagnosis not present

## 2024-01-18 DIAGNOSIS — G9340 Encephalopathy, unspecified: Secondary | ICD-10-CM | POA: Diagnosis not present

## 2024-01-18 DIAGNOSIS — E785 Hyperlipidemia, unspecified: Secondary | ICD-10-CM | POA: Diagnosis not present

## 2024-01-18 DIAGNOSIS — N401 Enlarged prostate with lower urinary tract symptoms: Secondary | ICD-10-CM | POA: Diagnosis not present

## 2024-01-18 DIAGNOSIS — N138 Other obstructive and reflux uropathy: Secondary | ICD-10-CM | POA: Diagnosis not present

## 2024-01-18 DIAGNOSIS — N183 Chronic kidney disease, stage 3 unspecified: Secondary | ICD-10-CM | POA: Diagnosis not present

## 2024-01-18 DIAGNOSIS — I129 Hypertensive chronic kidney disease with stage 1 through stage 4 chronic kidney disease, or unspecified chronic kidney disease: Secondary | ICD-10-CM | POA: Diagnosis not present

## 2024-01-18 DIAGNOSIS — J984 Other disorders of lung: Secondary | ICD-10-CM | POA: Diagnosis not present

## 2024-01-21 DIAGNOSIS — R1311 Dysphagia, oral phase: Secondary | ICD-10-CM | POA: Diagnosis not present

## 2024-01-21 DIAGNOSIS — E785 Hyperlipidemia, unspecified: Secondary | ICD-10-CM | POA: Diagnosis not present

## 2024-01-21 DIAGNOSIS — R41841 Cognitive communication deficit: Secondary | ICD-10-CM | POA: Diagnosis not present

## 2024-01-21 DIAGNOSIS — N183 Chronic kidney disease, stage 3 unspecified: Secondary | ICD-10-CM | POA: Diagnosis not present

## 2024-01-21 DIAGNOSIS — J984 Other disorders of lung: Secondary | ICD-10-CM | POA: Diagnosis not present

## 2024-01-21 DIAGNOSIS — E568 Deficiency of other vitamins: Secondary | ICD-10-CM | POA: Diagnosis not present

## 2024-01-21 DIAGNOSIS — G9340 Encephalopathy, unspecified: Secondary | ICD-10-CM | POA: Diagnosis not present

## 2024-01-21 DIAGNOSIS — I10 Essential (primary) hypertension: Secondary | ICD-10-CM | POA: Diagnosis not present

## 2024-01-21 DIAGNOSIS — E119 Type 2 diabetes mellitus without complications: Secondary | ICD-10-CM | POA: Diagnosis not present

## 2024-01-21 DIAGNOSIS — E43 Unspecified severe protein-calorie malnutrition: Secondary | ICD-10-CM | POA: Diagnosis not present

## 2024-01-21 DIAGNOSIS — M6281 Muscle weakness (generalized): Secondary | ICD-10-CM | POA: Diagnosis not present

## 2024-01-21 DIAGNOSIS — N138 Other obstructive and reflux uropathy: Secondary | ICD-10-CM | POA: Diagnosis not present

## 2024-01-21 DIAGNOSIS — N401 Enlarged prostate with lower urinary tract symptoms: Secondary | ICD-10-CM | POA: Diagnosis not present

## 2024-01-22 DIAGNOSIS — M6281 Muscle weakness (generalized): Secondary | ICD-10-CM | POA: Diagnosis not present

## 2024-01-22 DIAGNOSIS — R41841 Cognitive communication deficit: Secondary | ICD-10-CM | POA: Diagnosis not present

## 2024-01-22 DIAGNOSIS — G9340 Encephalopathy, unspecified: Secondary | ICD-10-CM | POA: Diagnosis not present

## 2024-01-22 DIAGNOSIS — R1311 Dysphagia, oral phase: Secondary | ICD-10-CM | POA: Diagnosis not present

## 2024-01-23 DIAGNOSIS — J984 Other disorders of lung: Secondary | ICD-10-CM | POA: Diagnosis not present

## 2024-01-23 DIAGNOSIS — M6281 Muscle weakness (generalized): Secondary | ICD-10-CM | POA: Diagnosis not present

## 2024-01-23 DIAGNOSIS — G9340 Encephalopathy, unspecified: Secondary | ICD-10-CM | POA: Diagnosis not present

## 2024-01-23 DIAGNOSIS — R41841 Cognitive communication deficit: Secondary | ICD-10-CM | POA: Diagnosis not present

## 2024-01-23 DIAGNOSIS — N183 Chronic kidney disease, stage 3 unspecified: Secondary | ICD-10-CM | POA: Diagnosis not present

## 2024-01-23 DIAGNOSIS — N138 Other obstructive and reflux uropathy: Secondary | ICD-10-CM | POA: Diagnosis not present

## 2024-01-23 DIAGNOSIS — I129 Hypertensive chronic kidney disease with stage 1 through stage 4 chronic kidney disease, or unspecified chronic kidney disease: Secondary | ICD-10-CM | POA: Diagnosis not present

## 2024-01-23 DIAGNOSIS — E43 Unspecified severe protein-calorie malnutrition: Secondary | ICD-10-CM | POA: Diagnosis not present

## 2024-01-23 DIAGNOSIS — E785 Hyperlipidemia, unspecified: Secondary | ICD-10-CM | POA: Diagnosis not present

## 2024-01-23 DIAGNOSIS — E1122 Type 2 diabetes mellitus with diabetic chronic kidney disease: Secondary | ICD-10-CM | POA: Diagnosis not present

## 2024-01-23 DIAGNOSIS — R1311 Dysphagia, oral phase: Secondary | ICD-10-CM | POA: Diagnosis not present

## 2024-01-23 DIAGNOSIS — E568 Deficiency of other vitamins: Secondary | ICD-10-CM | POA: Diagnosis not present

## 2024-01-23 DIAGNOSIS — N401 Enlarged prostate with lower urinary tract symptoms: Secondary | ICD-10-CM | POA: Diagnosis not present

## 2024-01-24 DIAGNOSIS — M6281 Muscle weakness (generalized): Secondary | ICD-10-CM | POA: Diagnosis not present

## 2024-01-24 DIAGNOSIS — I10 Essential (primary) hypertension: Secondary | ICD-10-CM | POA: Diagnosis not present

## 2024-01-24 DIAGNOSIS — N183 Chronic kidney disease, stage 3 unspecified: Secondary | ICD-10-CM | POA: Diagnosis not present

## 2024-01-24 DIAGNOSIS — N401 Enlarged prostate with lower urinary tract symptoms: Secondary | ICD-10-CM | POA: Diagnosis not present

## 2024-01-24 DIAGNOSIS — E785 Hyperlipidemia, unspecified: Secondary | ICD-10-CM | POA: Diagnosis not present

## 2024-01-24 DIAGNOSIS — E119 Type 2 diabetes mellitus without complications: Secondary | ICD-10-CM | POA: Diagnosis not present

## 2024-01-24 DIAGNOSIS — R1311 Dysphagia, oral phase: Secondary | ICD-10-CM | POA: Diagnosis not present

## 2024-01-24 DIAGNOSIS — N138 Other obstructive and reflux uropathy: Secondary | ICD-10-CM | POA: Diagnosis not present

## 2024-01-24 DIAGNOSIS — R41841 Cognitive communication deficit: Secondary | ICD-10-CM | POA: Diagnosis not present

## 2024-01-24 DIAGNOSIS — G9340 Encephalopathy, unspecified: Secondary | ICD-10-CM | POA: Diagnosis not present

## 2024-01-24 DIAGNOSIS — E568 Deficiency of other vitamins: Secondary | ICD-10-CM | POA: Diagnosis not present

## 2024-01-24 DIAGNOSIS — J984 Other disorders of lung: Secondary | ICD-10-CM | POA: Diagnosis not present

## 2024-01-24 DIAGNOSIS — E43 Unspecified severe protein-calorie malnutrition: Secondary | ICD-10-CM | POA: Diagnosis not present

## 2024-01-25 DIAGNOSIS — N183 Chronic kidney disease, stage 3 unspecified: Secondary | ICD-10-CM | POA: Diagnosis not present

## 2024-01-25 DIAGNOSIS — R1311 Dysphagia, oral phase: Secondary | ICD-10-CM | POA: Diagnosis not present

## 2024-01-25 DIAGNOSIS — E119 Type 2 diabetes mellitus without complications: Secondary | ICD-10-CM | POA: Diagnosis not present

## 2024-01-25 DIAGNOSIS — N138 Other obstructive and reflux uropathy: Secondary | ICD-10-CM | POA: Diagnosis not present

## 2024-01-25 DIAGNOSIS — E785 Hyperlipidemia, unspecified: Secondary | ICD-10-CM | POA: Diagnosis not present

## 2024-01-25 DIAGNOSIS — G9340 Encephalopathy, unspecified: Secondary | ICD-10-CM | POA: Diagnosis not present

## 2024-01-25 DIAGNOSIS — J984 Other disorders of lung: Secondary | ICD-10-CM | POA: Diagnosis not present

## 2024-01-25 DIAGNOSIS — R41841 Cognitive communication deficit: Secondary | ICD-10-CM | POA: Diagnosis not present

## 2024-01-25 DIAGNOSIS — E43 Unspecified severe protein-calorie malnutrition: Secondary | ICD-10-CM | POA: Diagnosis not present

## 2024-01-25 DIAGNOSIS — N401 Enlarged prostate with lower urinary tract symptoms: Secondary | ICD-10-CM | POA: Diagnosis not present

## 2024-01-25 DIAGNOSIS — E568 Deficiency of other vitamins: Secondary | ICD-10-CM | POA: Diagnosis not present

## 2024-01-25 DIAGNOSIS — I129 Hypertensive chronic kidney disease with stage 1 through stage 4 chronic kidney disease, or unspecified chronic kidney disease: Secondary | ICD-10-CM | POA: Diagnosis not present

## 2024-01-25 DIAGNOSIS — M6281 Muscle weakness (generalized): Secondary | ICD-10-CM | POA: Diagnosis not present

## 2024-01-26 DIAGNOSIS — R41841 Cognitive communication deficit: Secondary | ICD-10-CM | POA: Diagnosis not present

## 2024-01-26 DIAGNOSIS — R1311 Dysphagia, oral phase: Secondary | ICD-10-CM | POA: Diagnosis not present

## 2024-01-26 DIAGNOSIS — M6281 Muscle weakness (generalized): Secondary | ICD-10-CM | POA: Diagnosis not present

## 2024-01-26 DIAGNOSIS — G9340 Encephalopathy, unspecified: Secondary | ICD-10-CM | POA: Diagnosis not present

## 2024-01-28 DIAGNOSIS — G9340 Encephalopathy, unspecified: Secondary | ICD-10-CM | POA: Diagnosis not present

## 2024-01-28 DIAGNOSIS — N183 Chronic kidney disease, stage 3 unspecified: Secondary | ICD-10-CM | POA: Diagnosis not present

## 2024-01-28 DIAGNOSIS — R1311 Dysphagia, oral phase: Secondary | ICD-10-CM | POA: Diagnosis not present

## 2024-01-28 DIAGNOSIS — E119 Type 2 diabetes mellitus without complications: Secondary | ICD-10-CM | POA: Diagnosis not present

## 2024-01-28 DIAGNOSIS — J984 Other disorders of lung: Secondary | ICD-10-CM | POA: Diagnosis not present

## 2024-01-28 DIAGNOSIS — E785 Hyperlipidemia, unspecified: Secondary | ICD-10-CM | POA: Diagnosis not present

## 2024-01-28 DIAGNOSIS — M6281 Muscle weakness (generalized): Secondary | ICD-10-CM | POA: Diagnosis not present

## 2024-01-28 DIAGNOSIS — I129 Hypertensive chronic kidney disease with stage 1 through stage 4 chronic kidney disease, or unspecified chronic kidney disease: Secondary | ICD-10-CM | POA: Diagnosis not present

## 2024-01-28 DIAGNOSIS — E43 Unspecified severe protein-calorie malnutrition: Secondary | ICD-10-CM | POA: Diagnosis not present

## 2024-01-28 DIAGNOSIS — E568 Deficiency of other vitamins: Secondary | ICD-10-CM | POA: Diagnosis not present

## 2024-01-28 DIAGNOSIS — N401 Enlarged prostate with lower urinary tract symptoms: Secondary | ICD-10-CM | POA: Diagnosis not present

## 2024-01-28 DIAGNOSIS — R41841 Cognitive communication deficit: Secondary | ICD-10-CM | POA: Diagnosis not present

## 2024-01-28 DIAGNOSIS — N138 Other obstructive and reflux uropathy: Secondary | ICD-10-CM | POA: Diagnosis not present

## 2024-01-29 DIAGNOSIS — G9340 Encephalopathy, unspecified: Secondary | ICD-10-CM | POA: Diagnosis not present

## 2024-01-29 DIAGNOSIS — R41841 Cognitive communication deficit: Secondary | ICD-10-CM | POA: Diagnosis not present

## 2024-01-29 DIAGNOSIS — M6281 Muscle weakness (generalized): Secondary | ICD-10-CM | POA: Diagnosis not present

## 2024-01-29 DIAGNOSIS — R1311 Dysphagia, oral phase: Secondary | ICD-10-CM | POA: Diagnosis not present

## 2024-01-30 DIAGNOSIS — I129 Hypertensive chronic kidney disease with stage 1 through stage 4 chronic kidney disease, or unspecified chronic kidney disease: Secondary | ICD-10-CM | POA: Diagnosis not present

## 2024-01-30 DIAGNOSIS — N138 Other obstructive and reflux uropathy: Secondary | ICD-10-CM | POA: Diagnosis not present

## 2024-01-30 DIAGNOSIS — N183 Chronic kidney disease, stage 3 unspecified: Secondary | ICD-10-CM | POA: Diagnosis not present

## 2024-01-30 DIAGNOSIS — R41841 Cognitive communication deficit: Secondary | ICD-10-CM | POA: Diagnosis not present

## 2024-01-30 DIAGNOSIS — G9340 Encephalopathy, unspecified: Secondary | ICD-10-CM | POA: Diagnosis not present

## 2024-01-30 DIAGNOSIS — M6281 Muscle weakness (generalized): Secondary | ICD-10-CM | POA: Diagnosis not present

## 2024-01-30 DIAGNOSIS — R1311 Dysphagia, oral phase: Secondary | ICD-10-CM | POA: Diagnosis not present

## 2024-01-30 DIAGNOSIS — E119 Type 2 diabetes mellitus without complications: Secondary | ICD-10-CM | POA: Diagnosis not present

## 2024-01-30 DIAGNOSIS — E43 Unspecified severe protein-calorie malnutrition: Secondary | ICD-10-CM | POA: Diagnosis not present

## 2024-01-30 DIAGNOSIS — J984 Other disorders of lung: Secondary | ICD-10-CM | POA: Diagnosis not present

## 2024-01-30 DIAGNOSIS — E568 Deficiency of other vitamins: Secondary | ICD-10-CM | POA: Diagnosis not present

## 2024-01-30 DIAGNOSIS — N401 Enlarged prostate with lower urinary tract symptoms: Secondary | ICD-10-CM | POA: Diagnosis not present

## 2024-01-30 DIAGNOSIS — E785 Hyperlipidemia, unspecified: Secondary | ICD-10-CM | POA: Diagnosis not present

## 2024-01-31 DIAGNOSIS — R1311 Dysphagia, oral phase: Secondary | ICD-10-CM | POA: Diagnosis not present

## 2024-01-31 DIAGNOSIS — M6281 Muscle weakness (generalized): Secondary | ICD-10-CM | POA: Diagnosis not present

## 2024-01-31 DIAGNOSIS — R41841 Cognitive communication deficit: Secondary | ICD-10-CM | POA: Diagnosis not present

## 2024-01-31 DIAGNOSIS — G9340 Encephalopathy, unspecified: Secondary | ICD-10-CM | POA: Diagnosis not present

## 2024-02-01 DIAGNOSIS — E119 Type 2 diabetes mellitus without complications: Secondary | ICD-10-CM | POA: Diagnosis not present

## 2024-02-01 DIAGNOSIS — N183 Chronic kidney disease, stage 3 unspecified: Secondary | ICD-10-CM | POA: Diagnosis not present

## 2024-02-01 DIAGNOSIS — M6281 Muscle weakness (generalized): Secondary | ICD-10-CM | POA: Diagnosis not present

## 2024-02-01 DIAGNOSIS — R1311 Dysphagia, oral phase: Secondary | ICD-10-CM | POA: Diagnosis not present

## 2024-02-01 DIAGNOSIS — E785 Hyperlipidemia, unspecified: Secondary | ICD-10-CM | POA: Diagnosis not present

## 2024-02-01 DIAGNOSIS — J984 Other disorders of lung: Secondary | ICD-10-CM | POA: Diagnosis not present

## 2024-02-01 DIAGNOSIS — R41841 Cognitive communication deficit: Secondary | ICD-10-CM | POA: Diagnosis not present

## 2024-02-01 DIAGNOSIS — E43 Unspecified severe protein-calorie malnutrition: Secondary | ICD-10-CM | POA: Diagnosis not present

## 2024-02-01 DIAGNOSIS — E568 Deficiency of other vitamins: Secondary | ICD-10-CM | POA: Diagnosis not present

## 2024-02-01 DIAGNOSIS — I129 Hypertensive chronic kidney disease with stage 1 through stage 4 chronic kidney disease, or unspecified chronic kidney disease: Secondary | ICD-10-CM | POA: Diagnosis not present

## 2024-02-01 DIAGNOSIS — G9340 Encephalopathy, unspecified: Secondary | ICD-10-CM | POA: Diagnosis not present

## 2024-02-01 DIAGNOSIS — N401 Enlarged prostate with lower urinary tract symptoms: Secondary | ICD-10-CM | POA: Diagnosis not present

## 2024-02-01 DIAGNOSIS — N138 Other obstructive and reflux uropathy: Secondary | ICD-10-CM | POA: Diagnosis not present

## 2024-02-02 DIAGNOSIS — M6281 Muscle weakness (generalized): Secondary | ICD-10-CM | POA: Diagnosis not present

## 2024-02-02 DIAGNOSIS — R1311 Dysphagia, oral phase: Secondary | ICD-10-CM | POA: Diagnosis not present

## 2024-02-02 DIAGNOSIS — G9340 Encephalopathy, unspecified: Secondary | ICD-10-CM | POA: Diagnosis not present

## 2024-02-02 DIAGNOSIS — R41841 Cognitive communication deficit: Secondary | ICD-10-CM | POA: Diagnosis not present

## 2024-02-04 DIAGNOSIS — R41841 Cognitive communication deficit: Secondary | ICD-10-CM | POA: Diagnosis not present

## 2024-02-04 DIAGNOSIS — G9340 Encephalopathy, unspecified: Secondary | ICD-10-CM | POA: Diagnosis not present

## 2024-02-04 DIAGNOSIS — M6281 Muscle weakness (generalized): Secondary | ICD-10-CM | POA: Diagnosis not present

## 2024-02-04 DIAGNOSIS — R1311 Dysphagia, oral phase: Secondary | ICD-10-CM | POA: Diagnosis not present

## 2024-02-05 DIAGNOSIS — E43 Unspecified severe protein-calorie malnutrition: Secondary | ICD-10-CM | POA: Diagnosis not present

## 2024-02-05 DIAGNOSIS — J984 Other disorders of lung: Secondary | ICD-10-CM | POA: Diagnosis not present

## 2024-02-05 DIAGNOSIS — R1311 Dysphagia, oral phase: Secondary | ICD-10-CM | POA: Diagnosis not present

## 2024-02-05 DIAGNOSIS — E119 Type 2 diabetes mellitus without complications: Secondary | ICD-10-CM | POA: Diagnosis not present

## 2024-02-05 DIAGNOSIS — N138 Other obstructive and reflux uropathy: Secondary | ICD-10-CM | POA: Diagnosis not present

## 2024-02-05 DIAGNOSIS — N183 Chronic kidney disease, stage 3 unspecified: Secondary | ICD-10-CM | POA: Diagnosis not present

## 2024-02-05 DIAGNOSIS — M6281 Muscle weakness (generalized): Secondary | ICD-10-CM | POA: Diagnosis not present

## 2024-02-05 DIAGNOSIS — G9340 Encephalopathy, unspecified: Secondary | ICD-10-CM | POA: Diagnosis not present

## 2024-02-05 DIAGNOSIS — R41841 Cognitive communication deficit: Secondary | ICD-10-CM | POA: Diagnosis not present

## 2024-02-05 DIAGNOSIS — I129 Hypertensive chronic kidney disease with stage 1 through stage 4 chronic kidney disease, or unspecified chronic kidney disease: Secondary | ICD-10-CM | POA: Diagnosis not present

## 2024-02-05 DIAGNOSIS — E568 Deficiency of other vitamins: Secondary | ICD-10-CM | POA: Diagnosis not present

## 2024-02-05 DIAGNOSIS — E785 Hyperlipidemia, unspecified: Secondary | ICD-10-CM | POA: Diagnosis not present

## 2024-02-05 DIAGNOSIS — N401 Enlarged prostate with lower urinary tract symptoms: Secondary | ICD-10-CM | POA: Diagnosis not present

## 2024-02-06 DIAGNOSIS — G9340 Encephalopathy, unspecified: Secondary | ICD-10-CM | POA: Diagnosis not present

## 2024-02-06 DIAGNOSIS — R41841 Cognitive communication deficit: Secondary | ICD-10-CM | POA: Diagnosis not present

## 2024-02-06 DIAGNOSIS — E43 Unspecified severe protein-calorie malnutrition: Secondary | ICD-10-CM | POA: Diagnosis not present

## 2024-02-06 DIAGNOSIS — E119 Type 2 diabetes mellitus without complications: Secondary | ICD-10-CM | POA: Diagnosis not present

## 2024-02-06 DIAGNOSIS — E568 Deficiency of other vitamins: Secondary | ICD-10-CM | POA: Diagnosis not present

## 2024-02-06 DIAGNOSIS — N138 Other obstructive and reflux uropathy: Secondary | ICD-10-CM | POA: Diagnosis not present

## 2024-02-06 DIAGNOSIS — J984 Other disorders of lung: Secondary | ICD-10-CM | POA: Diagnosis not present

## 2024-02-06 DIAGNOSIS — N401 Enlarged prostate with lower urinary tract symptoms: Secondary | ICD-10-CM | POA: Diagnosis not present

## 2024-02-06 DIAGNOSIS — I129 Hypertensive chronic kidney disease with stage 1 through stage 4 chronic kidney disease, or unspecified chronic kidney disease: Secondary | ICD-10-CM | POA: Diagnosis not present

## 2024-02-06 DIAGNOSIS — R1311 Dysphagia, oral phase: Secondary | ICD-10-CM | POA: Diagnosis not present

## 2024-02-06 DIAGNOSIS — N183 Chronic kidney disease, stage 3 unspecified: Secondary | ICD-10-CM | POA: Diagnosis not present

## 2024-02-06 DIAGNOSIS — E785 Hyperlipidemia, unspecified: Secondary | ICD-10-CM | POA: Diagnosis not present

## 2024-02-06 DIAGNOSIS — M6281 Muscle weakness (generalized): Secondary | ICD-10-CM | POA: Diagnosis not present

## 2024-02-07 DIAGNOSIS — G9340 Encephalopathy, unspecified: Secondary | ICD-10-CM | POA: Diagnosis not present

## 2024-02-07 DIAGNOSIS — R1311 Dysphagia, oral phase: Secondary | ICD-10-CM | POA: Diagnosis not present

## 2024-02-07 DIAGNOSIS — R41841 Cognitive communication deficit: Secondary | ICD-10-CM | POA: Diagnosis not present

## 2024-02-07 DIAGNOSIS — M6281 Muscle weakness (generalized): Secondary | ICD-10-CM | POA: Diagnosis not present

## 2024-02-08 DIAGNOSIS — E119 Type 2 diabetes mellitus without complications: Secondary | ICD-10-CM | POA: Diagnosis not present

## 2024-02-08 DIAGNOSIS — E568 Deficiency of other vitamins: Secondary | ICD-10-CM | POA: Diagnosis not present

## 2024-02-08 DIAGNOSIS — I10 Essential (primary) hypertension: Secondary | ICD-10-CM | POA: Diagnosis not present

## 2024-02-08 DIAGNOSIS — N183 Chronic kidney disease, stage 3 unspecified: Secondary | ICD-10-CM | POA: Diagnosis not present

## 2024-02-08 DIAGNOSIS — J984 Other disorders of lung: Secondary | ICD-10-CM | POA: Diagnosis not present

## 2024-02-08 DIAGNOSIS — E43 Unspecified severe protein-calorie malnutrition: Secondary | ICD-10-CM | POA: Diagnosis not present

## 2024-02-08 DIAGNOSIS — E785 Hyperlipidemia, unspecified: Secondary | ICD-10-CM | POA: Diagnosis not present

## 2024-02-08 DIAGNOSIS — N138 Other obstructive and reflux uropathy: Secondary | ICD-10-CM | POA: Diagnosis not present

## 2024-02-08 DIAGNOSIS — M6281 Muscle weakness (generalized): Secondary | ICD-10-CM | POA: Diagnosis not present

## 2024-02-08 DIAGNOSIS — G9340 Encephalopathy, unspecified: Secondary | ICD-10-CM | POA: Diagnosis not present

## 2024-02-08 DIAGNOSIS — N401 Enlarged prostate with lower urinary tract symptoms: Secondary | ICD-10-CM | POA: Diagnosis not present

## 2024-02-08 DIAGNOSIS — R1311 Dysphagia, oral phase: Secondary | ICD-10-CM | POA: Diagnosis not present

## 2024-02-08 DIAGNOSIS — R41841 Cognitive communication deficit: Secondary | ICD-10-CM | POA: Diagnosis not present

## 2024-02-09 DIAGNOSIS — G9340 Encephalopathy, unspecified: Secondary | ICD-10-CM | POA: Diagnosis not present

## 2024-02-09 DIAGNOSIS — R41841 Cognitive communication deficit: Secondary | ICD-10-CM | POA: Diagnosis not present

## 2024-02-09 DIAGNOSIS — M6281 Muscle weakness (generalized): Secondary | ICD-10-CM | POA: Diagnosis not present

## 2024-02-09 DIAGNOSIS — R1311 Dysphagia, oral phase: Secondary | ICD-10-CM | POA: Diagnosis not present

## 2024-02-11 DIAGNOSIS — E785 Hyperlipidemia, unspecified: Secondary | ICD-10-CM | POA: Diagnosis not present

## 2024-02-11 DIAGNOSIS — I10 Essential (primary) hypertension: Secondary | ICD-10-CM | POA: Diagnosis not present

## 2024-02-11 DIAGNOSIS — N183 Chronic kidney disease, stage 3 unspecified: Secondary | ICD-10-CM | POA: Diagnosis not present

## 2024-02-11 DIAGNOSIS — M6281 Muscle weakness (generalized): Secondary | ICD-10-CM | POA: Diagnosis not present

## 2024-02-11 DIAGNOSIS — J984 Other disorders of lung: Secondary | ICD-10-CM | POA: Diagnosis not present

## 2024-02-11 DIAGNOSIS — G9340 Encephalopathy, unspecified: Secondary | ICD-10-CM | POA: Diagnosis not present

## 2024-02-11 DIAGNOSIS — N138 Other obstructive and reflux uropathy: Secondary | ICD-10-CM | POA: Diagnosis not present

## 2024-02-11 DIAGNOSIS — E568 Deficiency of other vitamins: Secondary | ICD-10-CM | POA: Diagnosis not present

## 2024-02-11 DIAGNOSIS — N401 Enlarged prostate with lower urinary tract symptoms: Secondary | ICD-10-CM | POA: Diagnosis not present

## 2024-02-11 DIAGNOSIS — E119 Type 2 diabetes mellitus without complications: Secondary | ICD-10-CM | POA: Diagnosis not present

## 2024-02-11 DIAGNOSIS — E43 Unspecified severe protein-calorie malnutrition: Secondary | ICD-10-CM | POA: Diagnosis not present

## 2024-02-12 DIAGNOSIS — M6281 Muscle weakness (generalized): Secondary | ICD-10-CM | POA: Diagnosis not present

## 2024-02-12 DIAGNOSIS — R41841 Cognitive communication deficit: Secondary | ICD-10-CM | POA: Diagnosis not present

## 2024-02-12 DIAGNOSIS — R1311 Dysphagia, oral phase: Secondary | ICD-10-CM | POA: Diagnosis not present

## 2024-02-12 DIAGNOSIS — G9340 Encephalopathy, unspecified: Secondary | ICD-10-CM | POA: Diagnosis not present

## 2024-02-13 DIAGNOSIS — G9340 Encephalopathy, unspecified: Secondary | ICD-10-CM | POA: Diagnosis not present

## 2024-02-13 DIAGNOSIS — E43 Unspecified severe protein-calorie malnutrition: Secondary | ICD-10-CM | POA: Diagnosis not present

## 2024-02-13 DIAGNOSIS — R41841 Cognitive communication deficit: Secondary | ICD-10-CM | POA: Diagnosis not present

## 2024-02-13 DIAGNOSIS — N138 Other obstructive and reflux uropathy: Secondary | ICD-10-CM | POA: Diagnosis not present

## 2024-02-13 DIAGNOSIS — J984 Other disorders of lung: Secondary | ICD-10-CM | POA: Diagnosis not present

## 2024-02-13 DIAGNOSIS — E119 Type 2 diabetes mellitus without complications: Secondary | ICD-10-CM | POA: Diagnosis not present

## 2024-02-13 DIAGNOSIS — N401 Enlarged prostate with lower urinary tract symptoms: Secondary | ICD-10-CM | POA: Diagnosis not present

## 2024-02-13 DIAGNOSIS — R1311 Dysphagia, oral phase: Secondary | ICD-10-CM | POA: Diagnosis not present

## 2024-02-13 DIAGNOSIS — E568 Deficiency of other vitamins: Secondary | ICD-10-CM | POA: Diagnosis not present

## 2024-02-13 DIAGNOSIS — E785 Hyperlipidemia, unspecified: Secondary | ICD-10-CM | POA: Diagnosis not present

## 2024-02-13 DIAGNOSIS — N183 Chronic kidney disease, stage 3 unspecified: Secondary | ICD-10-CM | POA: Diagnosis not present

## 2024-02-13 DIAGNOSIS — M6281 Muscle weakness (generalized): Secondary | ICD-10-CM | POA: Diagnosis not present

## 2024-02-13 DIAGNOSIS — I129 Hypertensive chronic kidney disease with stage 1 through stage 4 chronic kidney disease, or unspecified chronic kidney disease: Secondary | ICD-10-CM | POA: Diagnosis not present

## 2024-02-14 DIAGNOSIS — M6281 Muscle weakness (generalized): Secondary | ICD-10-CM | POA: Diagnosis not present

## 2024-02-14 DIAGNOSIS — G9340 Encephalopathy, unspecified: Secondary | ICD-10-CM | POA: Diagnosis not present

## 2024-02-15 DIAGNOSIS — I129 Hypertensive chronic kidney disease with stage 1 through stage 4 chronic kidney disease, or unspecified chronic kidney disease: Secondary | ICD-10-CM | POA: Diagnosis not present

## 2024-02-15 DIAGNOSIS — N138 Other obstructive and reflux uropathy: Secondary | ICD-10-CM | POA: Diagnosis not present

## 2024-02-15 DIAGNOSIS — R1311 Dysphagia, oral phase: Secondary | ICD-10-CM | POA: Diagnosis not present

## 2024-02-15 DIAGNOSIS — R41841 Cognitive communication deficit: Secondary | ICD-10-CM | POA: Diagnosis not present

## 2024-02-15 DIAGNOSIS — E43 Unspecified severe protein-calorie malnutrition: Secondary | ICD-10-CM | POA: Diagnosis not present

## 2024-02-15 DIAGNOSIS — E119 Type 2 diabetes mellitus without complications: Secondary | ICD-10-CM | POA: Diagnosis not present

## 2024-02-15 DIAGNOSIS — G9340 Encephalopathy, unspecified: Secondary | ICD-10-CM | POA: Diagnosis not present

## 2024-02-15 DIAGNOSIS — N183 Chronic kidney disease, stage 3 unspecified: Secondary | ICD-10-CM | POA: Diagnosis not present

## 2024-02-15 DIAGNOSIS — M6281 Muscle weakness (generalized): Secondary | ICD-10-CM | POA: Diagnosis not present

## 2024-02-15 DIAGNOSIS — J984 Other disorders of lung: Secondary | ICD-10-CM | POA: Diagnosis not present

## 2024-02-15 DIAGNOSIS — E785 Hyperlipidemia, unspecified: Secondary | ICD-10-CM | POA: Diagnosis not present

## 2024-02-15 DIAGNOSIS — E568 Deficiency of other vitamins: Secondary | ICD-10-CM | POA: Diagnosis not present

## 2024-02-15 DIAGNOSIS — N401 Enlarged prostate with lower urinary tract symptoms: Secondary | ICD-10-CM | POA: Diagnosis not present

## 2024-02-18 DIAGNOSIS — N401 Enlarged prostate with lower urinary tract symptoms: Secondary | ICD-10-CM | POA: Diagnosis not present

## 2024-02-18 DIAGNOSIS — N138 Other obstructive and reflux uropathy: Secondary | ICD-10-CM | POA: Diagnosis not present

## 2024-02-18 DIAGNOSIS — E785 Hyperlipidemia, unspecified: Secondary | ICD-10-CM | POA: Diagnosis not present

## 2024-02-18 DIAGNOSIS — N183 Chronic kidney disease, stage 3 unspecified: Secondary | ICD-10-CM | POA: Diagnosis not present

## 2024-02-18 DIAGNOSIS — E43 Unspecified severe protein-calorie malnutrition: Secondary | ICD-10-CM | POA: Diagnosis not present

## 2024-02-18 DIAGNOSIS — G9340 Encephalopathy, unspecified: Secondary | ICD-10-CM | POA: Diagnosis not present

## 2024-02-18 DIAGNOSIS — R41841 Cognitive communication deficit: Secondary | ICD-10-CM | POA: Diagnosis not present

## 2024-02-18 DIAGNOSIS — I10 Essential (primary) hypertension: Secondary | ICD-10-CM | POA: Diagnosis not present

## 2024-02-18 DIAGNOSIS — R1311 Dysphagia, oral phase: Secondary | ICD-10-CM | POA: Diagnosis not present

## 2024-02-18 DIAGNOSIS — J984 Other disorders of lung: Secondary | ICD-10-CM | POA: Diagnosis not present

## 2024-02-18 DIAGNOSIS — E119 Type 2 diabetes mellitus without complications: Secondary | ICD-10-CM | POA: Diagnosis not present

## 2024-02-18 DIAGNOSIS — M6281 Muscle weakness (generalized): Secondary | ICD-10-CM | POA: Diagnosis not present

## 2024-02-18 DIAGNOSIS — E568 Deficiency of other vitamins: Secondary | ICD-10-CM | POA: Diagnosis not present

## 2024-02-19 DIAGNOSIS — G9340 Encephalopathy, unspecified: Secondary | ICD-10-CM | POA: Diagnosis not present

## 2024-02-19 DIAGNOSIS — R1311 Dysphagia, oral phase: Secondary | ICD-10-CM | POA: Diagnosis not present

## 2024-02-19 DIAGNOSIS — N183 Chronic kidney disease, stage 3 unspecified: Secondary | ICD-10-CM | POA: Diagnosis not present

## 2024-02-19 DIAGNOSIS — E568 Deficiency of other vitamins: Secondary | ICD-10-CM | POA: Diagnosis not present

## 2024-02-19 DIAGNOSIS — E785 Hyperlipidemia, unspecified: Secondary | ICD-10-CM | POA: Diagnosis not present

## 2024-02-19 DIAGNOSIS — R41841 Cognitive communication deficit: Secondary | ICD-10-CM | POA: Diagnosis not present

## 2024-02-19 DIAGNOSIS — N138 Other obstructive and reflux uropathy: Secondary | ICD-10-CM | POA: Diagnosis not present

## 2024-02-19 DIAGNOSIS — J984 Other disorders of lung: Secondary | ICD-10-CM | POA: Diagnosis not present

## 2024-02-19 DIAGNOSIS — E119 Type 2 diabetes mellitus without complications: Secondary | ICD-10-CM | POA: Diagnosis not present

## 2024-02-19 DIAGNOSIS — M6281 Muscle weakness (generalized): Secondary | ICD-10-CM | POA: Diagnosis not present

## 2024-02-19 DIAGNOSIS — N401 Enlarged prostate with lower urinary tract symptoms: Secondary | ICD-10-CM | POA: Diagnosis not present

## 2024-02-19 DIAGNOSIS — E43 Unspecified severe protein-calorie malnutrition: Secondary | ICD-10-CM | POA: Diagnosis not present

## 2024-02-19 DIAGNOSIS — I10 Essential (primary) hypertension: Secondary | ICD-10-CM | POA: Diagnosis not present

## 2024-02-20 DIAGNOSIS — I129 Hypertensive chronic kidney disease with stage 1 through stage 4 chronic kidney disease, or unspecified chronic kidney disease: Secondary | ICD-10-CM | POA: Diagnosis not present

## 2024-02-20 DIAGNOSIS — R1311 Dysphagia, oral phase: Secondary | ICD-10-CM | POA: Diagnosis not present

## 2024-02-20 DIAGNOSIS — N401 Enlarged prostate with lower urinary tract symptoms: Secondary | ICD-10-CM | POA: Diagnosis not present

## 2024-02-20 DIAGNOSIS — E119 Type 2 diabetes mellitus without complications: Secondary | ICD-10-CM | POA: Diagnosis not present

## 2024-02-20 DIAGNOSIS — G9340 Encephalopathy, unspecified: Secondary | ICD-10-CM | POA: Diagnosis not present

## 2024-02-20 DIAGNOSIS — N138 Other obstructive and reflux uropathy: Secondary | ICD-10-CM | POA: Diagnosis not present

## 2024-02-20 DIAGNOSIS — R41841 Cognitive communication deficit: Secondary | ICD-10-CM | POA: Diagnosis not present

## 2024-02-20 DIAGNOSIS — E43 Unspecified severe protein-calorie malnutrition: Secondary | ICD-10-CM | POA: Diagnosis not present

## 2024-02-20 DIAGNOSIS — N183 Chronic kidney disease, stage 3 unspecified: Secondary | ICD-10-CM | POA: Diagnosis not present

## 2024-02-20 DIAGNOSIS — J984 Other disorders of lung: Secondary | ICD-10-CM | POA: Diagnosis not present

## 2024-02-20 DIAGNOSIS — E785 Hyperlipidemia, unspecified: Secondary | ICD-10-CM | POA: Diagnosis not present

## 2024-02-20 DIAGNOSIS — M6281 Muscle weakness (generalized): Secondary | ICD-10-CM | POA: Diagnosis not present

## 2024-02-20 DIAGNOSIS — E568 Deficiency of other vitamins: Secondary | ICD-10-CM | POA: Diagnosis not present

## 2024-02-21 DIAGNOSIS — R41841 Cognitive communication deficit: Secondary | ICD-10-CM | POA: Diagnosis not present

## 2024-02-21 DIAGNOSIS — M6281 Muscle weakness (generalized): Secondary | ICD-10-CM | POA: Diagnosis not present

## 2024-02-21 DIAGNOSIS — R1311 Dysphagia, oral phase: Secondary | ICD-10-CM | POA: Diagnosis not present

## 2024-02-21 DIAGNOSIS — G9340 Encephalopathy, unspecified: Secondary | ICD-10-CM | POA: Diagnosis not present

## 2024-02-22 DIAGNOSIS — E785 Hyperlipidemia, unspecified: Secondary | ICD-10-CM | POA: Diagnosis not present

## 2024-02-22 DIAGNOSIS — N138 Other obstructive and reflux uropathy: Secondary | ICD-10-CM | POA: Diagnosis not present

## 2024-02-22 DIAGNOSIS — R41841 Cognitive communication deficit: Secondary | ICD-10-CM | POA: Diagnosis not present

## 2024-02-22 DIAGNOSIS — N183 Chronic kidney disease, stage 3 unspecified: Secondary | ICD-10-CM | POA: Diagnosis not present

## 2024-02-22 DIAGNOSIS — E568 Deficiency of other vitamins: Secondary | ICD-10-CM | POA: Diagnosis not present

## 2024-02-22 DIAGNOSIS — I10 Essential (primary) hypertension: Secondary | ICD-10-CM | POA: Diagnosis not present

## 2024-02-22 DIAGNOSIS — N401 Enlarged prostate with lower urinary tract symptoms: Secondary | ICD-10-CM | POA: Diagnosis not present

## 2024-02-22 DIAGNOSIS — G9340 Encephalopathy, unspecified: Secondary | ICD-10-CM | POA: Diagnosis not present

## 2024-02-22 DIAGNOSIS — E119 Type 2 diabetes mellitus without complications: Secondary | ICD-10-CM | POA: Diagnosis not present

## 2024-02-22 DIAGNOSIS — M6281 Muscle weakness (generalized): Secondary | ICD-10-CM | POA: Diagnosis not present

## 2024-02-22 DIAGNOSIS — J984 Other disorders of lung: Secondary | ICD-10-CM | POA: Diagnosis not present

## 2024-02-22 DIAGNOSIS — R1311 Dysphagia, oral phase: Secondary | ICD-10-CM | POA: Diagnosis not present

## 2024-02-22 DIAGNOSIS — E43 Unspecified severe protein-calorie malnutrition: Secondary | ICD-10-CM | POA: Diagnosis not present

## 2024-02-24 DIAGNOSIS — M6281 Muscle weakness (generalized): Secondary | ICD-10-CM | POA: Diagnosis not present

## 2024-02-24 DIAGNOSIS — R41841 Cognitive communication deficit: Secondary | ICD-10-CM | POA: Diagnosis not present

## 2024-02-24 DIAGNOSIS — R1311 Dysphagia, oral phase: Secondary | ICD-10-CM | POA: Diagnosis not present

## 2024-02-24 DIAGNOSIS — G9340 Encephalopathy, unspecified: Secondary | ICD-10-CM | POA: Diagnosis not present

## 2024-02-25 DIAGNOSIS — E568 Deficiency of other vitamins: Secondary | ICD-10-CM | POA: Diagnosis not present

## 2024-02-25 DIAGNOSIS — N401 Enlarged prostate with lower urinary tract symptoms: Secondary | ICD-10-CM | POA: Diagnosis not present

## 2024-02-25 DIAGNOSIS — N138 Other obstructive and reflux uropathy: Secondary | ICD-10-CM | POA: Diagnosis not present

## 2024-02-25 DIAGNOSIS — R1311 Dysphagia, oral phase: Secondary | ICD-10-CM | POA: Diagnosis not present

## 2024-02-25 DIAGNOSIS — R41841 Cognitive communication deficit: Secondary | ICD-10-CM | POA: Diagnosis not present

## 2024-02-25 DIAGNOSIS — E43 Unspecified severe protein-calorie malnutrition: Secondary | ICD-10-CM | POA: Diagnosis not present

## 2024-02-25 DIAGNOSIS — G9340 Encephalopathy, unspecified: Secondary | ICD-10-CM | POA: Diagnosis not present

## 2024-02-25 DIAGNOSIS — E785 Hyperlipidemia, unspecified: Secondary | ICD-10-CM | POA: Diagnosis not present

## 2024-02-25 DIAGNOSIS — E119 Type 2 diabetes mellitus without complications: Secondary | ICD-10-CM | POA: Diagnosis not present

## 2024-02-25 DIAGNOSIS — N183 Chronic kidney disease, stage 3 unspecified: Secondary | ICD-10-CM | POA: Diagnosis not present

## 2024-02-25 DIAGNOSIS — J984 Other disorders of lung: Secondary | ICD-10-CM | POA: Diagnosis not present

## 2024-02-25 DIAGNOSIS — M6281 Muscle weakness (generalized): Secondary | ICD-10-CM | POA: Diagnosis not present

## 2024-02-25 DIAGNOSIS — I10 Essential (primary) hypertension: Secondary | ICD-10-CM | POA: Diagnosis not present

## 2024-02-26 ENCOUNTER — Ambulatory Visit: Admitting: Diagnostic Neuroimaging

## 2024-02-26 DIAGNOSIS — R1311 Dysphagia, oral phase: Secondary | ICD-10-CM | POA: Diagnosis not present

## 2024-02-26 DIAGNOSIS — R41841 Cognitive communication deficit: Secondary | ICD-10-CM | POA: Diagnosis not present

## 2024-02-26 DIAGNOSIS — G9340 Encephalopathy, unspecified: Secondary | ICD-10-CM | POA: Diagnosis not present

## 2024-02-26 DIAGNOSIS — M6281 Muscle weakness (generalized): Secondary | ICD-10-CM | POA: Diagnosis not present

## 2024-02-27 DIAGNOSIS — I10 Essential (primary) hypertension: Secondary | ICD-10-CM | POA: Diagnosis not present

## 2024-02-27 DIAGNOSIS — N138 Other obstructive and reflux uropathy: Secondary | ICD-10-CM | POA: Diagnosis not present

## 2024-02-27 DIAGNOSIS — E119 Type 2 diabetes mellitus without complications: Secondary | ICD-10-CM | POA: Diagnosis not present

## 2024-02-27 DIAGNOSIS — E568 Deficiency of other vitamins: Secondary | ICD-10-CM | POA: Diagnosis not present

## 2024-02-27 DIAGNOSIS — N183 Chronic kidney disease, stage 3 unspecified: Secondary | ICD-10-CM | POA: Diagnosis not present

## 2024-02-27 DIAGNOSIS — R1311 Dysphagia, oral phase: Secondary | ICD-10-CM | POA: Diagnosis not present

## 2024-02-27 DIAGNOSIS — E43 Unspecified severe protein-calorie malnutrition: Secondary | ICD-10-CM | POA: Diagnosis not present

## 2024-02-27 DIAGNOSIS — G9340 Encephalopathy, unspecified: Secondary | ICD-10-CM | POA: Diagnosis not present

## 2024-02-27 DIAGNOSIS — R41841 Cognitive communication deficit: Secondary | ICD-10-CM | POA: Diagnosis not present

## 2024-02-27 DIAGNOSIS — N401 Enlarged prostate with lower urinary tract symptoms: Secondary | ICD-10-CM | POA: Diagnosis not present

## 2024-02-27 DIAGNOSIS — E785 Hyperlipidemia, unspecified: Secondary | ICD-10-CM | POA: Diagnosis not present

## 2024-02-27 DIAGNOSIS — J984 Other disorders of lung: Secondary | ICD-10-CM | POA: Diagnosis not present

## 2024-02-27 DIAGNOSIS — M6281 Muscle weakness (generalized): Secondary | ICD-10-CM | POA: Diagnosis not present

## 2024-02-28 DIAGNOSIS — R41841 Cognitive communication deficit: Secondary | ICD-10-CM | POA: Diagnosis not present

## 2024-02-28 DIAGNOSIS — M79641 Pain in right hand: Secondary | ICD-10-CM | POA: Diagnosis not present

## 2024-02-28 DIAGNOSIS — N138 Other obstructive and reflux uropathy: Secondary | ICD-10-CM | POA: Diagnosis not present

## 2024-02-28 DIAGNOSIS — J984 Other disorders of lung: Secondary | ICD-10-CM | POA: Diagnosis not present

## 2024-02-28 DIAGNOSIS — E119 Type 2 diabetes mellitus without complications: Secondary | ICD-10-CM | POA: Diagnosis not present

## 2024-02-28 DIAGNOSIS — R1311 Dysphagia, oral phase: Secondary | ICD-10-CM | POA: Diagnosis not present

## 2024-02-28 DIAGNOSIS — E568 Deficiency of other vitamins: Secondary | ICD-10-CM | POA: Diagnosis not present

## 2024-02-28 DIAGNOSIS — I10 Essential (primary) hypertension: Secondary | ICD-10-CM | POA: Diagnosis not present

## 2024-02-28 DIAGNOSIS — M6281 Muscle weakness (generalized): Secondary | ICD-10-CM | POA: Diagnosis not present

## 2024-02-28 DIAGNOSIS — G9340 Encephalopathy, unspecified: Secondary | ICD-10-CM | POA: Diagnosis not present

## 2024-02-28 DIAGNOSIS — E785 Hyperlipidemia, unspecified: Secondary | ICD-10-CM | POA: Diagnosis not present

## 2024-02-28 DIAGNOSIS — N183 Chronic kidney disease, stage 3 unspecified: Secondary | ICD-10-CM | POA: Diagnosis not present

## 2024-02-28 DIAGNOSIS — N401 Enlarged prostate with lower urinary tract symptoms: Secondary | ICD-10-CM | POA: Diagnosis not present

## 2024-02-28 DIAGNOSIS — E43 Unspecified severe protein-calorie malnutrition: Secondary | ICD-10-CM | POA: Diagnosis not present

## 2024-02-29 DIAGNOSIS — E43 Unspecified severe protein-calorie malnutrition: Secondary | ICD-10-CM | POA: Diagnosis not present

## 2024-02-29 DIAGNOSIS — N138 Other obstructive and reflux uropathy: Secondary | ICD-10-CM | POA: Diagnosis not present

## 2024-02-29 DIAGNOSIS — N183 Chronic kidney disease, stage 3 unspecified: Secondary | ICD-10-CM | POA: Diagnosis not present

## 2024-02-29 DIAGNOSIS — G9340 Encephalopathy, unspecified: Secondary | ICD-10-CM | POA: Diagnosis not present

## 2024-02-29 DIAGNOSIS — R1311 Dysphagia, oral phase: Secondary | ICD-10-CM | POA: Diagnosis not present

## 2024-02-29 DIAGNOSIS — E785 Hyperlipidemia, unspecified: Secondary | ICD-10-CM | POA: Diagnosis not present

## 2024-02-29 DIAGNOSIS — J984 Other disorders of lung: Secondary | ICD-10-CM | POA: Diagnosis not present

## 2024-02-29 DIAGNOSIS — E568 Deficiency of other vitamins: Secondary | ICD-10-CM | POA: Diagnosis not present

## 2024-02-29 DIAGNOSIS — N401 Enlarged prostate with lower urinary tract symptoms: Secondary | ICD-10-CM | POA: Diagnosis not present

## 2024-02-29 DIAGNOSIS — R41841 Cognitive communication deficit: Secondary | ICD-10-CM | POA: Diagnosis not present

## 2024-02-29 DIAGNOSIS — I10 Essential (primary) hypertension: Secondary | ICD-10-CM | POA: Diagnosis not present

## 2024-02-29 DIAGNOSIS — E119 Type 2 diabetes mellitus without complications: Secondary | ICD-10-CM | POA: Diagnosis not present

## 2024-02-29 DIAGNOSIS — M6281 Muscle weakness (generalized): Secondary | ICD-10-CM | POA: Diagnosis not present

## 2024-03-01 DIAGNOSIS — M158 Other polyosteoarthritis: Secondary | ICD-10-CM | POA: Diagnosis not present

## 2024-03-01 DIAGNOSIS — N138 Other obstructive and reflux uropathy: Secondary | ICD-10-CM | POA: Diagnosis not present

## 2024-03-01 DIAGNOSIS — M6281 Muscle weakness (generalized): Secondary | ICD-10-CM | POA: Diagnosis not present

## 2024-03-01 DIAGNOSIS — G9340 Encephalopathy, unspecified: Secondary | ICD-10-CM | POA: Diagnosis not present

## 2024-03-01 DIAGNOSIS — E119 Type 2 diabetes mellitus without complications: Secondary | ICD-10-CM | POA: Diagnosis not present

## 2024-03-01 DIAGNOSIS — J984 Other disorders of lung: Secondary | ICD-10-CM | POA: Diagnosis not present

## 2024-03-01 DIAGNOSIS — N183 Chronic kidney disease, stage 3 unspecified: Secondary | ICD-10-CM | POA: Diagnosis not present

## 2024-03-01 DIAGNOSIS — G9341 Metabolic encephalopathy: Secondary | ICD-10-CM | POA: Diagnosis not present

## 2024-03-01 DIAGNOSIS — E43 Unspecified severe protein-calorie malnutrition: Secondary | ICD-10-CM | POA: Diagnosis not present

## 2024-03-03 ENCOUNTER — Ambulatory Visit (INDEPENDENT_AMBULATORY_CARE_PROVIDER_SITE_OTHER): Admitting: Orthopedic Surgery

## 2024-03-03 ENCOUNTER — Other Ambulatory Visit (INDEPENDENT_AMBULATORY_CARE_PROVIDER_SITE_OTHER): Payer: Self-pay

## 2024-03-03 DIAGNOSIS — N183 Chronic kidney disease, stage 3 unspecified: Secondary | ICD-10-CM | POA: Diagnosis not present

## 2024-03-03 DIAGNOSIS — M79641 Pain in right hand: Secondary | ICD-10-CM

## 2024-03-03 DIAGNOSIS — S62356A Nondisplaced fracture of shaft of fifth metacarpal bone, right hand, initial encounter for closed fracture: Secondary | ICD-10-CM

## 2024-03-03 DIAGNOSIS — J984 Other disorders of lung: Secondary | ICD-10-CM | POA: Diagnosis not present

## 2024-03-03 DIAGNOSIS — E785 Hyperlipidemia, unspecified: Secondary | ICD-10-CM | POA: Diagnosis not present

## 2024-03-03 DIAGNOSIS — E43 Unspecified severe protein-calorie malnutrition: Secondary | ICD-10-CM | POA: Diagnosis not present

## 2024-03-03 DIAGNOSIS — M6281 Muscle weakness (generalized): Secondary | ICD-10-CM | POA: Diagnosis not present

## 2024-03-03 DIAGNOSIS — R1311 Dysphagia, oral phase: Secondary | ICD-10-CM | POA: Diagnosis not present

## 2024-03-03 DIAGNOSIS — R41841 Cognitive communication deficit: Secondary | ICD-10-CM | POA: Diagnosis not present

## 2024-03-03 DIAGNOSIS — I129 Hypertensive chronic kidney disease with stage 1 through stage 4 chronic kidney disease, or unspecified chronic kidney disease: Secondary | ICD-10-CM | POA: Diagnosis not present

## 2024-03-03 DIAGNOSIS — S62324A Displaced fracture of shaft of fourth metacarpal bone, right hand, initial encounter for closed fracture: Secondary | ICD-10-CM

## 2024-03-03 DIAGNOSIS — N401 Enlarged prostate with lower urinary tract symptoms: Secondary | ICD-10-CM | POA: Diagnosis not present

## 2024-03-03 DIAGNOSIS — N138 Other obstructive and reflux uropathy: Secondary | ICD-10-CM | POA: Diagnosis not present

## 2024-03-03 DIAGNOSIS — E119 Type 2 diabetes mellitus without complications: Secondary | ICD-10-CM | POA: Diagnosis not present

## 2024-03-03 DIAGNOSIS — E568 Deficiency of other vitamins: Secondary | ICD-10-CM | POA: Diagnosis not present

## 2024-03-03 DIAGNOSIS — G9340 Encephalopathy, unspecified: Secondary | ICD-10-CM | POA: Diagnosis not present

## 2024-03-03 NOTE — Progress Notes (Signed)
 Chase Mccarthy - 83 y.o. male MRN [id number]  Date of birth: 10/20/1940  Office Visit Note: Visit Date: 03/03/2024 PCP: Addie Camellia CROME, MD Referred by: Addie Camellia CROME, MD  Subjective: No chief complaint on file.  HPI: Chase Mccarthy is a pleasant 83 y.o. male who presents today for evaluation of right hand injury sustained approximately 2 weeks prior.  He is currently in an assisted living facility, he presents today with his daughter.  Per the daughter, there was a fall approximately 2 weeks prior, unsure if it was a witnessed fall or not.  He has had associated hand swelling since that time and associated pain.  Has been able to do range of motion, however is having difficulty with utilization of the hand.  She states that she did believe he underwent x-rays, however we do not have access to these images.  Is unclear whether or not he was placed into appropriate immobilization at that time based on the x-ray findings.  Pertinent ROS were reviewed with the patient and found to be negative unless otherwise specified above in HPI.   Visit Reason: Right hand pain Duration of symptoms: two weeks ago-had fall Hand dominance: left Occupation: Diabetic: Yes Smoking:  Heart/Lung History: restrictive lung disease Blood Thinners: none  Prior Testing/EMG:xrays at facility Injections (Date):none Treatments: given splint from facility Prior Surgery:none  Assessment & Plan: Visit Diagnoses:  1. Closed displaced fracture of shaft of fourth metacarpal bone of right hand, initial encounter   2. Pain in right hand   3. Closed nondisplaced fracture of shaft of fifth metacarpal bone of right hand, initial encounter     Plan: Based on clinical and radiographic workup today, patient has sustained metacarpal fractures to the ring and small finger.  The ring finger fracture is oblique in nature with slight shortening and displacement.  The small finger fracture is less displaced in nature, there is slight  angulation appreciated.  Fortunately, there is no significant rotational abnormality, patient is able to perform limited range of motion without any significant digital overlap.  We discussed treatment modalities ranging from conservative to surgical.  Given the patient's underlying comorbidities and functional needs, patient is appropriate candidate for nonoperative treatment of the right hand ring and small finger metacarpal fractures.  Given that there is no significant rotational abnormality and the displacement and shortening are within acceptable parameters, we can allow for ongoing healing in this position.  I did explain that it is quite important to immobilize the hand for the time being in order to allow for ongoing healing.  My concern is that given the fact that he was likely not immobilized during the initial 2-week period, there may have been some interval displacement from his initial injuries.  I discussed this in detail with the daughter who expressed full understanding.  Ulnar gutter TKO brace was applied today.  Strict instructions for full-time use were discussed, okay to remove for hygiene only.  Follow-up in 2 weeks for repeat clinical and radiographic check.  I spent 45 minutes in the care of this patient today including review of previous documentation, imaging obtained, face-to-face time discussing all options regarding treatment and documenting the encounter.   Follow-up: No follow-ups on file.   Meds & Orders: No orders of the defined types were placed in this encounter.   Orders Placed This Encounter  Procedures   XR Hand Complete Right     Procedures: No procedures performed      Clinical  History: No specialty comments available.  He reports that he has been smoking cigars. He has never used smokeless tobacco.  Recent Labs    12/02/23 1542  HGBA1C 5.6    Objective:   Vital Signs: There were no vitals taken for this visit.  Physical Exam  Gen:  Well-appearing, in no acute distress; non-toxic CV: Regular Rate. Well-perfused. Warm.  Resp: Breathing unlabored on room air; no wheezing. Psych: Fluid speech in conversation; appropriate affect; normal thought process  Ortho Exam Right hand: - Notable swelling over the dorsal aspect of the hand, most pronounced ulnarly, associated tenderness over the ring and small finger metacarpal regions - Attempted composite fist with limited range of motion, no significant signs of digital overlap, no rotational abnormalities notable to the ring and small finger - Sensations intact distally median/radial/ulnar, AIN/PIN/interosseous intact - Hand remains warm well-perfused  Imaging: XR Hand Complete Right Result Date: 03/03/2024 X-rays of the right hand demonstrate metacarpal fractures of the ring and small finger.  Ring finger fracture is oblique in nature, midshaft region with displacement and slight shortening of approximately 4 mm.  Small finger fracture to the metacarpal neck/shaft region with mild angulation and minimal shortening.  MCP joints remain well located in all planes.   Past Medical/Family/Surgical/Social History: Medications & Allergies reviewed per EMR, new medications updated. Patient Active Problem List   Diagnosis Date Noted   Acute encephalopathy 11/30/2023   CKD (chronic kidney disease) stage 3, GFR 30-59 ml/min (HCC) 11/30/2023   Protein-calorie malnutrition, severe 01/19/2023   AKI (acute kidney injury) (HCC) 01/18/2023   Hypertension    Diabetes mellitus (HCC)    Restrictive lung disease    Onychomycosis    Essential hypertension 01/13/2011    Class: Chronic   Diabetes mellitus type 2 in nonobese (HCC) 01/13/2011   Vitamin D deficiency 08/17/2009    Class: History of   Benign prostatic hyperplasia with urinary obstruction 01/13/2004    Class: Chronic   Hyperlipidemia with target LDL less than 100 10/28/1999    Class: History of   Tobacco abuse 10/15/1992     Class: Chronic   Past Medical History:  Diagnosis Date   BPH (benign prostatic hyperplasia)    mild   Dermatitis    axillary   Diabetes mellitus (HCC)    Dyslipidemia    Hypertension    stable   Onychomycosis    Restrictive lung disease    Tobacco use    Tricuspid valve regurgitation    Family History  Problem Relation Age of Onset   Heart failure Mother    Cancer Father    Past Surgical History:  Procedure Laterality Date   CARDIAC CATHETERIZATION  11/20/2003   normal coronaries   NM MYOCAR PERF WALL MOTION  03/27/2012   normal   US  ECHOCARDIOGRAPHY  09/23/2009   mild-mod LVH,EF =>55%,mild MVP,trace MR,mild to mod TR, AOV mildly sclerotic.   Social History   Occupational History   Not on file  Tobacco Use   Smoking status: Every Day    Types: Cigars   Smokeless tobacco: Never   Tobacco comments:    3 cigars daily  Substance and Sexual Activity   Alcohol use: No   Drug use: No   Sexual activity: Not Currently    Dannae Kato Estela) Arlinda, M.D. [facility], Hand Surgery

## 2024-03-04 DIAGNOSIS — I10 Essential (primary) hypertension: Secondary | ICD-10-CM | POA: Diagnosis not present

## 2024-03-04 DIAGNOSIS — J984 Other disorders of lung: Secondary | ICD-10-CM | POA: Diagnosis not present

## 2024-03-04 DIAGNOSIS — M6281 Muscle weakness (generalized): Secondary | ICD-10-CM | POA: Diagnosis not present

## 2024-03-04 DIAGNOSIS — R41841 Cognitive communication deficit: Secondary | ICD-10-CM | POA: Diagnosis not present

## 2024-03-04 DIAGNOSIS — E785 Hyperlipidemia, unspecified: Secondary | ICD-10-CM | POA: Diagnosis not present

## 2024-03-04 DIAGNOSIS — N138 Other obstructive and reflux uropathy: Secondary | ICD-10-CM | POA: Diagnosis not present

## 2024-03-04 DIAGNOSIS — E119 Type 2 diabetes mellitus without complications: Secondary | ICD-10-CM | POA: Diagnosis not present

## 2024-03-04 DIAGNOSIS — R1311 Dysphagia, oral phase: Secondary | ICD-10-CM | POA: Diagnosis not present

## 2024-03-04 DIAGNOSIS — E568 Deficiency of other vitamins: Secondary | ICD-10-CM | POA: Diagnosis not present

## 2024-03-04 DIAGNOSIS — E43 Unspecified severe protein-calorie malnutrition: Secondary | ICD-10-CM | POA: Diagnosis not present

## 2024-03-04 DIAGNOSIS — G9340 Encephalopathy, unspecified: Secondary | ICD-10-CM | POA: Diagnosis not present

## 2024-03-04 DIAGNOSIS — N401 Enlarged prostate with lower urinary tract symptoms: Secondary | ICD-10-CM | POA: Diagnosis not present

## 2024-03-04 DIAGNOSIS — N183 Chronic kidney disease, stage 3 unspecified: Secondary | ICD-10-CM | POA: Diagnosis not present

## 2024-03-05 DIAGNOSIS — R41841 Cognitive communication deficit: Secondary | ICD-10-CM | POA: Diagnosis not present

## 2024-03-05 DIAGNOSIS — G9340 Encephalopathy, unspecified: Secondary | ICD-10-CM | POA: Diagnosis not present

## 2024-03-05 DIAGNOSIS — R1311 Dysphagia, oral phase: Secondary | ICD-10-CM | POA: Diagnosis not present

## 2024-03-05 DIAGNOSIS — N401 Enlarged prostate with lower urinary tract symptoms: Secondary | ICD-10-CM | POA: Diagnosis not present

## 2024-03-05 DIAGNOSIS — E43 Unspecified severe protein-calorie malnutrition: Secondary | ICD-10-CM | POA: Diagnosis not present

## 2024-03-05 DIAGNOSIS — M6281 Muscle weakness (generalized): Secondary | ICD-10-CM | POA: Diagnosis not present

## 2024-03-05 DIAGNOSIS — I129 Hypertensive chronic kidney disease with stage 1 through stage 4 chronic kidney disease, or unspecified chronic kidney disease: Secondary | ICD-10-CM | POA: Diagnosis not present

## 2024-03-05 DIAGNOSIS — N138 Other obstructive and reflux uropathy: Secondary | ICD-10-CM | POA: Diagnosis not present

## 2024-03-05 DIAGNOSIS — E119 Type 2 diabetes mellitus without complications: Secondary | ICD-10-CM | POA: Diagnosis not present

## 2024-03-05 DIAGNOSIS — N183 Chronic kidney disease, stage 3 unspecified: Secondary | ICD-10-CM | POA: Diagnosis not present

## 2024-03-05 DIAGNOSIS — E568 Deficiency of other vitamins: Secondary | ICD-10-CM | POA: Diagnosis not present

## 2024-03-05 DIAGNOSIS — E785 Hyperlipidemia, unspecified: Secondary | ICD-10-CM | POA: Diagnosis not present

## 2024-03-05 DIAGNOSIS — J984 Other disorders of lung: Secondary | ICD-10-CM | POA: Diagnosis not present

## 2024-03-06 DIAGNOSIS — R1311 Dysphagia, oral phase: Secondary | ICD-10-CM | POA: Diagnosis not present

## 2024-03-06 DIAGNOSIS — M6281 Muscle weakness (generalized): Secondary | ICD-10-CM | POA: Diagnosis not present

## 2024-03-06 DIAGNOSIS — G9340 Encephalopathy, unspecified: Secondary | ICD-10-CM | POA: Diagnosis not present

## 2024-03-06 DIAGNOSIS — R41841 Cognitive communication deficit: Secondary | ICD-10-CM | POA: Diagnosis not present

## 2024-03-07 DIAGNOSIS — N183 Chronic kidney disease, stage 3 unspecified: Secondary | ICD-10-CM | POA: Diagnosis not present

## 2024-03-07 DIAGNOSIS — M6281 Muscle weakness (generalized): Secondary | ICD-10-CM | POA: Diagnosis not present

## 2024-03-07 DIAGNOSIS — R41841 Cognitive communication deficit: Secondary | ICD-10-CM | POA: Diagnosis not present

## 2024-03-07 DIAGNOSIS — I10 Essential (primary) hypertension: Secondary | ICD-10-CM | POA: Diagnosis not present

## 2024-03-07 DIAGNOSIS — R1311 Dysphagia, oral phase: Secondary | ICD-10-CM | POA: Diagnosis not present

## 2024-03-07 DIAGNOSIS — N401 Enlarged prostate with lower urinary tract symptoms: Secondary | ICD-10-CM | POA: Diagnosis not present

## 2024-03-07 DIAGNOSIS — N138 Other obstructive and reflux uropathy: Secondary | ICD-10-CM | POA: Diagnosis not present

## 2024-03-07 DIAGNOSIS — G9340 Encephalopathy, unspecified: Secondary | ICD-10-CM | POA: Diagnosis not present

## 2024-03-07 DIAGNOSIS — E43 Unspecified severe protein-calorie malnutrition: Secondary | ICD-10-CM | POA: Diagnosis not present

## 2024-03-07 DIAGNOSIS — E119 Type 2 diabetes mellitus without complications: Secondary | ICD-10-CM | POA: Diagnosis not present

## 2024-03-07 DIAGNOSIS — E568 Deficiency of other vitamins: Secondary | ICD-10-CM | POA: Diagnosis not present

## 2024-03-07 DIAGNOSIS — J984 Other disorders of lung: Secondary | ICD-10-CM | POA: Diagnosis not present

## 2024-03-07 DIAGNOSIS — E785 Hyperlipidemia, unspecified: Secondary | ICD-10-CM | POA: Diagnosis not present

## 2024-03-10 DIAGNOSIS — R41841 Cognitive communication deficit: Secondary | ICD-10-CM | POA: Diagnosis not present

## 2024-03-10 DIAGNOSIS — N183 Chronic kidney disease, stage 3 unspecified: Secondary | ICD-10-CM | POA: Diagnosis not present

## 2024-03-10 DIAGNOSIS — I10 Essential (primary) hypertension: Secondary | ICD-10-CM | POA: Diagnosis not present

## 2024-03-10 DIAGNOSIS — M6281 Muscle weakness (generalized): Secondary | ICD-10-CM | POA: Diagnosis not present

## 2024-03-10 DIAGNOSIS — G9340 Encephalopathy, unspecified: Secondary | ICD-10-CM | POA: Diagnosis not present

## 2024-03-10 DIAGNOSIS — J984 Other disorders of lung: Secondary | ICD-10-CM | POA: Diagnosis not present

## 2024-03-10 DIAGNOSIS — R1311 Dysphagia, oral phase: Secondary | ICD-10-CM | POA: Diagnosis not present

## 2024-03-10 DIAGNOSIS — E43 Unspecified severe protein-calorie malnutrition: Secondary | ICD-10-CM | POA: Diagnosis not present

## 2024-03-10 DIAGNOSIS — N401 Enlarged prostate with lower urinary tract symptoms: Secondary | ICD-10-CM | POA: Diagnosis not present

## 2024-03-10 DIAGNOSIS — E568 Deficiency of other vitamins: Secondary | ICD-10-CM | POA: Diagnosis not present

## 2024-03-10 DIAGNOSIS — E785 Hyperlipidemia, unspecified: Secondary | ICD-10-CM | POA: Diagnosis not present

## 2024-03-10 DIAGNOSIS — E119 Type 2 diabetes mellitus without complications: Secondary | ICD-10-CM | POA: Diagnosis not present

## 2024-03-10 DIAGNOSIS — N138 Other obstructive and reflux uropathy: Secondary | ICD-10-CM | POA: Diagnosis not present

## 2024-03-11 DIAGNOSIS — N138 Other obstructive and reflux uropathy: Secondary | ICD-10-CM | POA: Diagnosis not present

## 2024-03-11 DIAGNOSIS — E568 Deficiency of other vitamins: Secondary | ICD-10-CM | POA: Diagnosis not present

## 2024-03-11 DIAGNOSIS — M6281 Muscle weakness (generalized): Secondary | ICD-10-CM | POA: Diagnosis not present

## 2024-03-11 DIAGNOSIS — E785 Hyperlipidemia, unspecified: Secondary | ICD-10-CM | POA: Diagnosis not present

## 2024-03-11 DIAGNOSIS — N401 Enlarged prostate with lower urinary tract symptoms: Secondary | ICD-10-CM | POA: Diagnosis not present

## 2024-03-11 DIAGNOSIS — E119 Type 2 diabetes mellitus without complications: Secondary | ICD-10-CM | POA: Diagnosis not present

## 2024-03-11 DIAGNOSIS — R1311 Dysphagia, oral phase: Secondary | ICD-10-CM | POA: Diagnosis not present

## 2024-03-11 DIAGNOSIS — J984 Other disorders of lung: Secondary | ICD-10-CM | POA: Diagnosis not present

## 2024-03-11 DIAGNOSIS — E43 Unspecified severe protein-calorie malnutrition: Secondary | ICD-10-CM | POA: Diagnosis not present

## 2024-03-11 DIAGNOSIS — N183 Chronic kidney disease, stage 3 unspecified: Secondary | ICD-10-CM | POA: Diagnosis not present

## 2024-03-11 DIAGNOSIS — I10 Essential (primary) hypertension: Secondary | ICD-10-CM | POA: Diagnosis not present

## 2024-03-11 DIAGNOSIS — R41841 Cognitive communication deficit: Secondary | ICD-10-CM | POA: Diagnosis not present

## 2024-03-12 DIAGNOSIS — G9340 Encephalopathy, unspecified: Secondary | ICD-10-CM | POA: Diagnosis not present

## 2024-03-12 DIAGNOSIS — R1311 Dysphagia, oral phase: Secondary | ICD-10-CM | POA: Diagnosis not present

## 2024-03-12 DIAGNOSIS — M6281 Muscle weakness (generalized): Secondary | ICD-10-CM | POA: Diagnosis not present

## 2024-03-12 DIAGNOSIS — R41841 Cognitive communication deficit: Secondary | ICD-10-CM | POA: Diagnosis not present

## 2024-03-13 DIAGNOSIS — E43 Unspecified severe protein-calorie malnutrition: Secondary | ICD-10-CM | POA: Diagnosis not present

## 2024-03-13 DIAGNOSIS — M6281 Muscle weakness (generalized): Secondary | ICD-10-CM | POA: Diagnosis not present

## 2024-03-13 DIAGNOSIS — R41841 Cognitive communication deficit: Secondary | ICD-10-CM | POA: Diagnosis not present

## 2024-03-13 DIAGNOSIS — N138 Other obstructive and reflux uropathy: Secondary | ICD-10-CM | POA: Diagnosis not present

## 2024-03-13 DIAGNOSIS — I129 Hypertensive chronic kidney disease with stage 1 through stage 4 chronic kidney disease, or unspecified chronic kidney disease: Secondary | ICD-10-CM | POA: Diagnosis not present

## 2024-03-13 DIAGNOSIS — E785 Hyperlipidemia, unspecified: Secondary | ICD-10-CM | POA: Diagnosis not present

## 2024-03-13 DIAGNOSIS — J984 Other disorders of lung: Secondary | ICD-10-CM | POA: Diagnosis not present

## 2024-03-13 DIAGNOSIS — N183 Chronic kidney disease, stage 3 unspecified: Secondary | ICD-10-CM | POA: Diagnosis not present

## 2024-03-13 DIAGNOSIS — R1311 Dysphagia, oral phase: Secondary | ICD-10-CM | POA: Diagnosis not present

## 2024-03-13 DIAGNOSIS — E1122 Type 2 diabetes mellitus with diabetic chronic kidney disease: Secondary | ICD-10-CM | POA: Diagnosis not present

## 2024-03-13 DIAGNOSIS — E568 Deficiency of other vitamins: Secondary | ICD-10-CM | POA: Diagnosis not present

## 2024-03-13 DIAGNOSIS — N401 Enlarged prostate with lower urinary tract symptoms: Secondary | ICD-10-CM | POA: Diagnosis not present

## 2024-03-14 DIAGNOSIS — N401 Enlarged prostate with lower urinary tract symptoms: Secondary | ICD-10-CM | POA: Diagnosis not present

## 2024-03-14 DIAGNOSIS — G9340 Encephalopathy, unspecified: Secondary | ICD-10-CM | POA: Diagnosis not present

## 2024-03-14 DIAGNOSIS — I129 Hypertensive chronic kidney disease with stage 1 through stage 4 chronic kidney disease, or unspecified chronic kidney disease: Secondary | ICD-10-CM | POA: Diagnosis not present

## 2024-03-14 DIAGNOSIS — E568 Deficiency of other vitamins: Secondary | ICD-10-CM | POA: Diagnosis not present

## 2024-03-14 DIAGNOSIS — E43 Unspecified severe protein-calorie malnutrition: Secondary | ICD-10-CM | POA: Diagnosis not present

## 2024-03-14 DIAGNOSIS — M6281 Muscle weakness (generalized): Secondary | ICD-10-CM | POA: Diagnosis not present

## 2024-03-14 DIAGNOSIS — R1311 Dysphagia, oral phase: Secondary | ICD-10-CM | POA: Diagnosis not present

## 2024-03-14 DIAGNOSIS — N138 Other obstructive and reflux uropathy: Secondary | ICD-10-CM | POA: Diagnosis not present

## 2024-03-14 DIAGNOSIS — J984 Other disorders of lung: Secondary | ICD-10-CM | POA: Diagnosis not present

## 2024-03-14 DIAGNOSIS — E785 Hyperlipidemia, unspecified: Secondary | ICD-10-CM | POA: Diagnosis not present

## 2024-03-14 DIAGNOSIS — N183 Chronic kidney disease, stage 3 unspecified: Secondary | ICD-10-CM | POA: Diagnosis not present

## 2024-03-14 DIAGNOSIS — R41841 Cognitive communication deficit: Secondary | ICD-10-CM | POA: Diagnosis not present

## 2024-03-14 DIAGNOSIS — E1122 Type 2 diabetes mellitus with diabetic chronic kidney disease: Secondary | ICD-10-CM | POA: Diagnosis not present

## 2024-03-17 ENCOUNTER — Other Ambulatory Visit (INDEPENDENT_AMBULATORY_CARE_PROVIDER_SITE_OTHER): Payer: Self-pay

## 2024-03-17 ENCOUNTER — Ambulatory Visit (INDEPENDENT_AMBULATORY_CARE_PROVIDER_SITE_OTHER): Admitting: Orthopedic Surgery

## 2024-03-17 DIAGNOSIS — E119 Type 2 diabetes mellitus without complications: Secondary | ICD-10-CM | POA: Diagnosis not present

## 2024-03-17 DIAGNOSIS — N401 Enlarged prostate with lower urinary tract symptoms: Secondary | ICD-10-CM | POA: Diagnosis not present

## 2024-03-17 DIAGNOSIS — S62324A Displaced fracture of shaft of fourth metacarpal bone, right hand, initial encounter for closed fracture: Secondary | ICD-10-CM

## 2024-03-17 DIAGNOSIS — E568 Deficiency of other vitamins: Secondary | ICD-10-CM | POA: Diagnosis not present

## 2024-03-17 DIAGNOSIS — M6281 Muscle weakness (generalized): Secondary | ICD-10-CM | POA: Diagnosis not present

## 2024-03-17 DIAGNOSIS — I10 Essential (primary) hypertension: Secondary | ICD-10-CM | POA: Diagnosis not present

## 2024-03-17 DIAGNOSIS — R1311 Dysphagia, oral phase: Secondary | ICD-10-CM | POA: Diagnosis not present

## 2024-03-17 DIAGNOSIS — N183 Chronic kidney disease, stage 3 unspecified: Secondary | ICD-10-CM | POA: Diagnosis not present

## 2024-03-17 DIAGNOSIS — N138 Other obstructive and reflux uropathy: Secondary | ICD-10-CM | POA: Diagnosis not present

## 2024-03-17 DIAGNOSIS — E43 Unspecified severe protein-calorie malnutrition: Secondary | ICD-10-CM | POA: Diagnosis not present

## 2024-03-17 DIAGNOSIS — R41841 Cognitive communication deficit: Secondary | ICD-10-CM | POA: Diagnosis not present

## 2024-03-17 DIAGNOSIS — E785 Hyperlipidemia, unspecified: Secondary | ICD-10-CM | POA: Diagnosis not present

## 2024-03-17 DIAGNOSIS — J984 Other disorders of lung: Secondary | ICD-10-CM | POA: Diagnosis not present

## 2024-03-17 NOTE — Progress Notes (Signed)
 Chase Mccarthy - 83 y.o. male MRN [id number]  Date of birth: 1940-10-01  Office Visit Note: Visit Date: 03/17/2024 PCP: Addie Camellia CROME, MD Referred by: Addie Camellia CROME, MD  Subjective: No chief complaint on file.  HPI: Chase Mccarthy is a pleasant 83 y.o. male who returns today for evaluation of right hand injury sustained approximately 4 weeks prior.  He was diagnosed with metacarpal shaft fractures of the ring and small finger.  Has been placed into a TKO ulnar gutter brace.  Pertinent ROS were reviewed with the patient and found to be negative unless otherwise specified above in HPI.    Assessment & Plan: Visit Diagnoses:  1. Closed displaced fracture of shaft of fourth metacarpal bone of right hand, initial encounter     Plan: Repeat x-rays today show stable appearance of the metacarpal fracture of the ring and small finger.  On examination today, he is able to perform active digital range of motion of the hand with full composite fist without significant restriction.  There is no evidence of digital overlap or rotational abnormality.  New TKO ulnar gutter brace was given today to the patient given the contamination of the previous one.  I did emphasize the importance of hygiene and utilization of the brace moving forward.  Continue with immobilization for additional 2 weeks.  At that juncture, can begin range of motion per protocol.  I will plan on following up with him in 2 weeks to track his progress.    Follow-up: No follow-ups on file.   Meds & Orders: No orders of the defined types were placed in this encounter.   Orders Placed This Encounter  Procedures   XR Hand Complete Right     Procedures: No procedures performed      Clinical History: No specialty comments available.  He reports that he has been smoking cigars. He has never used smokeless tobacco.  Recent Labs    12/02/23 1542  HGBA1C 5.6    Objective:   Vital Signs: There were no vitals taken for this  visit.  Physical Exam  Gen: Well-appearing, in no acute distress; non-toxic CV: Regular Rate. Well-perfused. Warm.  Resp: Breathing unlabored on room air; no wheezing. Psych: Fluid speech in conversation; appropriate affect; normal thought process  Ortho Exam Right hand: - Minimal swelling over the dorsal aspect of the hand, minimal tenderness over the ring and small finger metacarpal region -Able to perform composite fist, no significant signs of digital overlap, no rotational abnormalities notable to the ring and small finger - Sensation intact distally median/radial/ulnar, AIN/PIN/interosseous intact - Hand remains warm well-perfused  Imaging: XR Hand Complete Right Result Date: 03/17/2024 X-rays of the right hand demonstrate previously known ring and small finger metacarpal shaft fractures.  There is evidence of bony consolidation indicating interval healing in these regions.  No interval displacement in comparison to prior films.    Past Medical/Family/Surgical/Social History: Medications & Allergies reviewed per EMR, new medications updated. Patient Active Problem List   Diagnosis Date Noted   Acute encephalopathy 11/30/2023   CKD (chronic kidney disease) stage 3, GFR 30-59 ml/min (HCC) 11/30/2023   Protein-calorie malnutrition, severe 01/19/2023   AKI (acute kidney injury) (HCC) 01/18/2023   Hypertension    Diabetes mellitus (HCC)    Restrictive lung disease    Onychomycosis    Essential hypertension 01/13/2011    Class: Chronic   Diabetes mellitus type 2 in nonobese (HCC) 01/13/2011   Vitamin D deficiency 08/17/2009  Class: History of   Benign prostatic hyperplasia with urinary obstruction 01/13/2004    Class: Chronic   Hyperlipidemia with target LDL less than 100 10/28/1999    Class: History of   Tobacco abuse 10/15/1992    Class: Chronic   Past Medical History:  Diagnosis Date   BPH (benign prostatic hyperplasia)    mild   Dermatitis    axillary    Diabetes mellitus (HCC)    Dyslipidemia    Hypertension    stable   Onychomycosis    Restrictive lung disease    Tobacco use    Tricuspid valve regurgitation    Family History  Problem Relation Age of Onset   Heart failure Mother    Cancer Father    Past Surgical History:  Procedure Laterality Date   CARDIAC CATHETERIZATION  11/20/2003   normal coronaries   NM MYOCAR PERF WALL MOTION  03/27/2012   normal   US  ECHOCARDIOGRAPHY  09/23/2009   mild-mod LVH,EF =>55%,mild MVP,trace MR,mild to mod TR, AOV mildly sclerotic.   Social History   Occupational History   Not on file  Tobacco Use   Smoking status: Every Day    Types: Cigars   Smokeless tobacco: Never   Tobacco comments:    3 cigars daily  Substance and Sexual Activity   Alcohol use: No   Drug use: No   Sexual activity: Not Currently    Chase Mccarthy) Chase Mccarthy, M.D. [facility], Hand Surgery

## 2024-03-18 DIAGNOSIS — I129 Hypertensive chronic kidney disease with stage 1 through stage 4 chronic kidney disease, or unspecified chronic kidney disease: Secondary | ICD-10-CM | POA: Diagnosis not present

## 2024-03-18 DIAGNOSIS — E119 Type 2 diabetes mellitus without complications: Secondary | ICD-10-CM | POA: Diagnosis not present

## 2024-03-18 DIAGNOSIS — M6281 Muscle weakness (generalized): Secondary | ICD-10-CM | POA: Diagnosis not present

## 2024-03-18 DIAGNOSIS — N401 Enlarged prostate with lower urinary tract symptoms: Secondary | ICD-10-CM | POA: Diagnosis not present

## 2024-03-18 DIAGNOSIS — E568 Deficiency of other vitamins: Secondary | ICD-10-CM | POA: Diagnosis not present

## 2024-03-18 DIAGNOSIS — N138 Other obstructive and reflux uropathy: Secondary | ICD-10-CM | POA: Diagnosis not present

## 2024-03-18 DIAGNOSIS — E43 Unspecified severe protein-calorie malnutrition: Secondary | ICD-10-CM | POA: Diagnosis not present

## 2024-03-18 DIAGNOSIS — R1311 Dysphagia, oral phase: Secondary | ICD-10-CM | POA: Diagnosis not present

## 2024-03-18 DIAGNOSIS — R41841 Cognitive communication deficit: Secondary | ICD-10-CM | POA: Diagnosis not present

## 2024-03-18 DIAGNOSIS — E785 Hyperlipidemia, unspecified: Secondary | ICD-10-CM | POA: Diagnosis not present

## 2024-03-18 DIAGNOSIS — N183 Chronic kidney disease, stage 3 unspecified: Secondary | ICD-10-CM | POA: Diagnosis not present

## 2024-03-18 DIAGNOSIS — J984 Other disorders of lung: Secondary | ICD-10-CM | POA: Diagnosis not present

## 2024-03-19 DIAGNOSIS — R1311 Dysphagia, oral phase: Secondary | ICD-10-CM | POA: Diagnosis not present

## 2024-03-19 DIAGNOSIS — M6281 Muscle weakness (generalized): Secondary | ICD-10-CM | POA: Diagnosis not present

## 2024-03-19 DIAGNOSIS — N401 Enlarged prostate with lower urinary tract symptoms: Secondary | ICD-10-CM | POA: Diagnosis not present

## 2024-03-19 DIAGNOSIS — I129 Hypertensive chronic kidney disease with stage 1 through stage 4 chronic kidney disease, or unspecified chronic kidney disease: Secondary | ICD-10-CM | POA: Diagnosis not present

## 2024-03-19 DIAGNOSIS — R41841 Cognitive communication deficit: Secondary | ICD-10-CM | POA: Diagnosis not present

## 2024-03-19 DIAGNOSIS — E43 Unspecified severe protein-calorie malnutrition: Secondary | ICD-10-CM | POA: Diagnosis not present

## 2024-03-19 DIAGNOSIS — N183 Chronic kidney disease, stage 3 unspecified: Secondary | ICD-10-CM | POA: Diagnosis not present

## 2024-03-19 DIAGNOSIS — E568 Deficiency of other vitamins: Secondary | ICD-10-CM | POA: Diagnosis not present

## 2024-03-19 DIAGNOSIS — N138 Other obstructive and reflux uropathy: Secondary | ICD-10-CM | POA: Diagnosis not present

## 2024-03-19 DIAGNOSIS — E1122 Type 2 diabetes mellitus with diabetic chronic kidney disease: Secondary | ICD-10-CM | POA: Diagnosis not present

## 2024-03-19 DIAGNOSIS — J984 Other disorders of lung: Secondary | ICD-10-CM | POA: Diagnosis not present

## 2024-03-19 DIAGNOSIS — E785 Hyperlipidemia, unspecified: Secondary | ICD-10-CM | POA: Diagnosis not present

## 2024-03-20 DIAGNOSIS — N138 Other obstructive and reflux uropathy: Secondary | ICD-10-CM | POA: Diagnosis not present

## 2024-03-20 DIAGNOSIS — I129 Hypertensive chronic kidney disease with stage 1 through stage 4 chronic kidney disease, or unspecified chronic kidney disease: Secondary | ICD-10-CM | POA: Diagnosis not present

## 2024-03-20 DIAGNOSIS — E785 Hyperlipidemia, unspecified: Secondary | ICD-10-CM | POA: Diagnosis not present

## 2024-03-20 DIAGNOSIS — E119 Type 2 diabetes mellitus without complications: Secondary | ICD-10-CM | POA: Diagnosis not present

## 2024-03-20 DIAGNOSIS — N401 Enlarged prostate with lower urinary tract symptoms: Secondary | ICD-10-CM | POA: Diagnosis not present

## 2024-03-20 DIAGNOSIS — R41841 Cognitive communication deficit: Secondary | ICD-10-CM | POA: Diagnosis not present

## 2024-03-20 DIAGNOSIS — J984 Other disorders of lung: Secondary | ICD-10-CM | POA: Diagnosis not present

## 2024-03-20 DIAGNOSIS — E43 Unspecified severe protein-calorie malnutrition: Secondary | ICD-10-CM | POA: Diagnosis not present

## 2024-03-20 DIAGNOSIS — E568 Deficiency of other vitamins: Secondary | ICD-10-CM | POA: Diagnosis not present

## 2024-03-20 DIAGNOSIS — M6281 Muscle weakness (generalized): Secondary | ICD-10-CM | POA: Diagnosis not present

## 2024-03-20 DIAGNOSIS — R1311 Dysphagia, oral phase: Secondary | ICD-10-CM | POA: Diagnosis not present

## 2024-03-20 DIAGNOSIS — N183 Chronic kidney disease, stage 3 unspecified: Secondary | ICD-10-CM | POA: Diagnosis not present

## 2024-03-21 ENCOUNTER — Emergency Department (HOSPITAL_COMMUNITY)
Admission: EM | Admit: 2024-03-21 | Discharge: 2024-03-22 | Disposition: A | Discharge status: Home / Self Care | Attending: Emergency Medicine | Admitting: Emergency Medicine

## 2024-03-21 ENCOUNTER — Other Ambulatory Visit: Payer: Self-pay

## 2024-03-21 DIAGNOSIS — N138 Other obstructive and reflux uropathy: Secondary | ICD-10-CM | POA: Diagnosis not present

## 2024-03-21 DIAGNOSIS — E43 Unspecified severe protein-calorie malnutrition: Secondary | ICD-10-CM | POA: Diagnosis not present

## 2024-03-21 DIAGNOSIS — N401 Enlarged prostate with lower urinary tract symptoms: Secondary | ICD-10-CM | POA: Diagnosis not present

## 2024-03-21 DIAGNOSIS — N183 Chronic kidney disease, stage 3 unspecified: Secondary | ICD-10-CM | POA: Diagnosis not present

## 2024-03-21 DIAGNOSIS — F0390 Unspecified dementia without behavioral disturbance: Secondary | ICD-10-CM | POA: Diagnosis not present

## 2024-03-21 DIAGNOSIS — Z79899 Other long term (current) drug therapy: Secondary | ICD-10-CM | POA: Diagnosis not present

## 2024-03-21 DIAGNOSIS — S62304A Unspecified fracture of fourth metacarpal bone, right hand, initial encounter for closed fracture: Secondary | ICD-10-CM | POA: Diagnosis not present

## 2024-03-21 DIAGNOSIS — R102 Pelvic and perineal pain: Secondary | ICD-10-CM | POA: Diagnosis not present

## 2024-03-21 DIAGNOSIS — E785 Hyperlipidemia, unspecified: Secondary | ICD-10-CM | POA: Diagnosis not present

## 2024-03-21 DIAGNOSIS — G44309 Post-traumatic headache, unspecified, not intractable: Secondary | ICD-10-CM | POA: Diagnosis not present

## 2024-03-21 DIAGNOSIS — R1311 Dysphagia, oral phase: Secondary | ICD-10-CM | POA: Diagnosis not present

## 2024-03-21 DIAGNOSIS — J984 Other disorders of lung: Secondary | ICD-10-CM | POA: Diagnosis not present

## 2024-03-21 DIAGNOSIS — G9340 Encephalopathy, unspecified: Secondary | ICD-10-CM | POA: Diagnosis not present

## 2024-03-21 DIAGNOSIS — Z7982 Long term (current) use of aspirin: Secondary | ICD-10-CM | POA: Diagnosis not present

## 2024-03-21 DIAGNOSIS — S0990XA Unspecified injury of head, initial encounter: Secondary | ICD-10-CM | POA: Diagnosis not present

## 2024-03-21 DIAGNOSIS — M7989 Other specified soft tissue disorders: Secondary | ICD-10-CM | POA: Diagnosis not present

## 2024-03-21 DIAGNOSIS — R7989 Other specified abnormal findings of blood chemistry: Secondary | ICD-10-CM | POA: Insufficient documentation

## 2024-03-21 DIAGNOSIS — E568 Deficiency of other vitamins: Secondary | ICD-10-CM | POA: Diagnosis not present

## 2024-03-21 DIAGNOSIS — J439 Emphysema, unspecified: Secondary | ICD-10-CM | POA: Diagnosis not present

## 2024-03-21 DIAGNOSIS — M6281 Muscle weakness (generalized): Secondary | ICD-10-CM | POA: Diagnosis not present

## 2024-03-21 DIAGNOSIS — Z043 Encounter for examination and observation following other accident: Secondary | ICD-10-CM | POA: Diagnosis not present

## 2024-03-21 DIAGNOSIS — I10 Essential (primary) hypertension: Secondary | ICD-10-CM | POA: Insufficient documentation

## 2024-03-21 DIAGNOSIS — R41841 Cognitive communication deficit: Secondary | ICD-10-CM | POA: Diagnosis not present

## 2024-03-21 DIAGNOSIS — W19XXXA Unspecified fall, initial encounter: Secondary | ICD-10-CM | POA: Diagnosis not present

## 2024-03-21 DIAGNOSIS — D649 Anemia, unspecified: Secondary | ICD-10-CM | POA: Insufficient documentation

## 2024-03-21 DIAGNOSIS — M542 Cervicalgia: Secondary | ICD-10-CM | POA: Diagnosis not present

## 2024-03-21 DIAGNOSIS — E119 Type 2 diabetes mellitus without complications: Secondary | ICD-10-CM | POA: Diagnosis not present

## 2024-03-21 DIAGNOSIS — E041 Nontoxic single thyroid nodule: Secondary | ICD-10-CM | POA: Diagnosis not present

## 2024-03-21 NOTE — ED Triage Notes (Addendum)
 Pt presents to ED from [facility] via EMS post unwitnessed fall. EMS states daughter walked in and found pt on floor sitting up against the wall. Pt does not remember falling and c/o back of head pain and neck soreness. EMS placed C-collar. EMS reports no blood thinners. Pt has dementia at baseline and facility reports pt being at baseline.   CBG 164 100/60 HR 80 96% RA

## 2024-03-22 ENCOUNTER — Emergency Department (HOSPITAL_COMMUNITY)

## 2024-03-22 DIAGNOSIS — G9340 Encephalopathy, unspecified: Secondary | ICD-10-CM | POA: Diagnosis not present

## 2024-03-22 DIAGNOSIS — M6281 Muscle weakness (generalized): Secondary | ICD-10-CM | POA: Diagnosis not present

## 2024-03-22 DIAGNOSIS — G44309 Post-traumatic headache, unspecified, not intractable: Secondary | ICD-10-CM | POA: Diagnosis not present

## 2024-03-22 DIAGNOSIS — R5383 Other fatigue: Secondary | ICD-10-CM | POA: Diagnosis not present

## 2024-03-22 DIAGNOSIS — S62304A Unspecified fracture of fourth metacarpal bone, right hand, initial encounter for closed fracture: Secondary | ICD-10-CM | POA: Diagnosis not present

## 2024-03-22 DIAGNOSIS — R102 Pelvic and perineal pain: Secondary | ICD-10-CM | POA: Diagnosis not present

## 2024-03-22 DIAGNOSIS — N183 Chronic kidney disease, stage 3 unspecified: Secondary | ICD-10-CM | POA: Diagnosis not present

## 2024-03-22 DIAGNOSIS — E119 Type 2 diabetes mellitus without complications: Secondary | ICD-10-CM | POA: Diagnosis not present

## 2024-03-22 DIAGNOSIS — Z743 Need for continuous supervision: Secondary | ICD-10-CM | POA: Diagnosis not present

## 2024-03-22 DIAGNOSIS — J984 Other disorders of lung: Secondary | ICD-10-CM | POA: Diagnosis not present

## 2024-03-22 DIAGNOSIS — M542 Cervicalgia: Secondary | ICD-10-CM | POA: Diagnosis not present

## 2024-03-22 DIAGNOSIS — E041 Nontoxic single thyroid nodule: Secondary | ICD-10-CM | POA: Diagnosis not present

## 2024-03-22 DIAGNOSIS — M158 Other polyosteoarthritis: Secondary | ICD-10-CM | POA: Diagnosis not present

## 2024-03-22 DIAGNOSIS — M7989 Other specified soft tissue disorders: Secondary | ICD-10-CM | POA: Diagnosis not present

## 2024-03-22 DIAGNOSIS — R531 Weakness: Secondary | ICD-10-CM | POA: Diagnosis not present

## 2024-03-22 DIAGNOSIS — Z043 Encounter for examination and observation following other accident: Secondary | ICD-10-CM | POA: Diagnosis not present

## 2024-03-22 DIAGNOSIS — G9341 Metabolic encephalopathy: Secondary | ICD-10-CM | POA: Diagnosis not present

## 2024-03-22 DIAGNOSIS — J439 Emphysema, unspecified: Secondary | ICD-10-CM | POA: Diagnosis not present

## 2024-03-22 DIAGNOSIS — E43 Unspecified severe protein-calorie malnutrition: Secondary | ICD-10-CM | POA: Diagnosis not present

## 2024-03-22 DIAGNOSIS — N138 Other obstructive and reflux uropathy: Secondary | ICD-10-CM | POA: Diagnosis not present

## 2024-03-22 LAB — CBC WITH DIFFERENTIAL/PLATELET
Abs Immature Granulocytes: 0.02 K/uL (ref 0.00–0.07)
Basophils Absolute: 0.1 K/uL (ref 0.0–0.1)
Basophils Relative: 1 %
Eosinophils Absolute: 0.3 K/uL (ref 0.0–0.5)
Eosinophils Relative: 6 %
HCT: 31.9 % — ABNORMAL LOW (ref 39.0–52.0)
Hemoglobin: 10.1 g/dL — ABNORMAL LOW (ref 13.0–17.0)
Immature Granulocytes: 0 %
Lymphocytes Relative: 26 %
Lymphs Abs: 1.2 K/uL (ref 0.7–4.0)
MCH: 30.8 pg (ref 26.0–34.0)
MCHC: 31.7 g/dL (ref 30.0–36.0)
MCV: 97.3 fL (ref 80.0–100.0)
Monocytes Absolute: 0.4 K/uL (ref 0.1–1.0)
Monocytes Relative: 10 %
Neutro Abs: 2.6 K/uL (ref 1.7–7.7)
Neutrophils Relative %: 57 %
Platelets: 202 K/uL (ref 150–400)
RBC: 3.28 MIL/uL — ABNORMAL LOW (ref 4.22–5.81)
RDW: 15 % (ref 11.5–15.5)
WBC: 4.6 K/uL (ref 4.0–10.5)
nRBC: 0 % (ref 0.0–0.2)

## 2024-03-22 LAB — COMPREHENSIVE METABOLIC PANEL WITH GFR
ALT: 12 U/L (ref 0–44)
AST: 22 U/L (ref 15–41)
Albumin: 3.5 g/dL (ref 3.5–5.0)
Alkaline Phosphatase: 109 U/L (ref 38–126)
Anion gap: 7 (ref 5–15)
BUN: 29 mg/dL — ABNORMAL HIGH (ref 8–23)
CO2: 27 mmol/L (ref 22–32)
Calcium: 8.7 mg/dL — ABNORMAL LOW (ref 8.9–10.3)
Chloride: 108 mmol/L (ref 98–111)
Creatinine, Ser: 1.73 mg/dL — ABNORMAL HIGH (ref 0.61–1.24)
GFR, Estimated: 38 mL/min — ABNORMAL LOW (ref >60)
Glucose, Bld: 108 mg/dL — ABNORMAL HIGH (ref 70–99)
Potassium: 5.1 mmol/L (ref 3.5–5.1)
Sodium: 142 mmol/L (ref 135–145)
Total Bilirubin: 0.6 mg/dL (ref 0.0–1.2)
Total Protein: 6.5 g/dL (ref 6.5–8.1)

## 2024-03-22 LAB — URINALYSIS, W/ REFLEX TO CULTURE (INFECTION SUSPECTED)
Bacteria, UA: NONE SEEN
Bilirubin Urine: NEGATIVE
Glucose, UA: NEGATIVE mg/dL
Hgb urine dipstick: NEGATIVE
Ketones, ur: NEGATIVE mg/dL
Leukocytes,Ua: NEGATIVE
Nitrite: NEGATIVE
Protein, ur: NEGATIVE mg/dL
Specific Gravity, Urine: 1.009 (ref 1.005–1.030)
pH: 7 (ref 5.0–8.0)

## 2024-03-22 LAB — VALPROIC ACID LEVEL: Valproic Acid Lvl: <10 ug/mL — ABNORMAL LOW (ref 50–100)

## 2024-03-22 MED ORDER — SODIUM CHLORIDE 0.9 % IV BOLUS
500.0000 mL | Freq: Once | INTRAVENOUS | Status: AC
  Administered 2024-03-22: 500 mL via INTRAVENOUS

## 2024-03-22 NOTE — ED Notes (Signed)
 Pt attempting to get out of bed. This RN placed bed alarm on pt and attempted to reorient pt to situation.

## 2024-03-22 NOTE — ED Notes (Signed)
This RN called [facility] for transport 

## 2024-03-22 NOTE — ED Provider Notes (Incomplete)
  [place] EMERGENCY DEPARTMENT AT [facility] Provider Note   CSN: [id number] Arrival date & time: 03/21/24  2343     Patient presents with: Chase Mccarthy is a 83 y.o. male.  {Add pertinent medical, surgical, social history, OB history to HPI:32947} HPI     Prior to Admission medications   Medication Sig Start Date End Date Taking? Authorizing Provider  acetaminophen  (TYLENOL ) 650 MG CR tablet Take 650 mg by mouth in the morning and at bedtime.    [provider]  amLODipine  (NORVASC ) 5 MG tablet Take 1 tablet (5 mg total) by mouth daily. 12/05/23   Cherlyn Labella, MD  aspirin  EC 81 MG tablet Take 1 tablet (81 mg total) by mouth daily. Swallow whole. 01/25/23   Jillian Buttery, MD  cyanocobalamin  (VITAMIN B12) 1000 MCG tablet Take 1 tablet (1,000 mcg total) by mouth daily. Follow with outpatient provider for follow up of B12 level. 12/06/23   Perri DELENA Meliton Mickey., MD  divalproex  (DEPAKOTE  SPRINKLE) 125 MG capsule Take 125 mg by mouth at bedtime.    [provider]  feeding supplement (ENSURE ENLIVE / ENSURE PLUS) LIQD Take 237 mLs by mouth 2 (two) times daily between meals. Patient not taking: Reported on 11/30/2023 01/24/23   Adhikari, Amrit, MD  finasteride (PROSCAR) 5 MG tablet Take 5 mg by mouth daily.    [provider]  melatonin 3 MG TABS tablet Take 3 mg by mouth at bedtime.    [provider]  methocarbamol  (ROBAXIN ) 500 MG tablet Take 500 mg by mouth daily at 12 noon.    [provider]  Multiple Vitamins-Minerals (MULTIVITAL PO) Take 1 tablet by mouth daily.    [provider]  Vitamin D, Cholecalciferol, 25 MCG (1000 UT) CAPS Take 1 tablet by mouth daily.    [provider]    Allergies: Patient has no known allergies.    Review of Systems  Updated Vital Signs BP 123/67   Pulse 80   Temp 97.7 F (36.5 C)   SpO2 100%   Physical Exam  (all labs ordered are listed, but only abnormal  results are displayed) Labs Reviewed - No data to display  EKG: None  Radiology: No results found.  {Document cardiac monitor, telemetry assessment procedure when appropriate:32947} Procedures   Medications Ordered in the ED - No data to display    {Click here for ABCD2, HEART and other calculators REFRESH Note before signing:1}                              Medical Decision Making Amount and/or Complexity of Data Reviewed Labs: ordered. Radiology: ordered.   ***  {Document critical care time when appropriate  Document review of labs and clinical decision tools ie CHADS2VASC2, etc  Document your independent review of radiology images and any outside records  Document your discussion with family members, caretakers and with consultants  Document social determinants of health affecting pt's care  Document your decision making why or why not admission, treatments were needed:32947:::1}   Final diagnoses:  None    ED Discharge Orders     None

## 2024-03-22 NOTE — ED Notes (Addendum)
 This RN attempted to call report back to [place] with no answer.

## 2024-03-22 NOTE — Discharge Instructions (Addendum)
 Patient should be wearing a brace on his right hand following visit to ortho last week.  STOP the Amlodipine . Follow up with primary care for lab recheck (specifically hemoglobin and Cr).

## 2024-03-22 NOTE — ED Provider Notes (Signed)
 [place] EMERGENCY DEPARTMENT AT [facility] Provider Note   CSN: [id number] Arrival date & time: 03/21/24  2343     Patient presents with: Chase Mccarthy is a 83 y.o. male.   83 year old male brought in by EMS from care facility. Per EMS- patient was found on the ground by daughter, presumed unwitnessed fall. No obvious injuries, unable to provide any history or answer questions.  Hx HTN, DM, dementia        Prior to Admission medications   Medication Sig Start Date End Date Taking? Authorizing Provider  acetaminophen  (TYLENOL ) 650 MG CR tablet Take 650 mg by mouth in the morning and at bedtime.    [provider]  aspirin  EC 81 MG tablet Take 1 tablet (81 mg total) by mouth daily. Swallow whole. 01/25/23   Jillian Buttery, MD  cyanocobalamin  (VITAMIN B12) 1000 MCG tablet Take 1 tablet (1,000 mcg total) by mouth daily. Follow with outpatient provider for follow up of B12 level. 12/06/23   Perri DELENA Meliton Mickey., MD  divalproex  (DEPAKOTE  SPRINKLE) 125 MG capsule Take 125 mg by mouth at bedtime.    [provider]  feeding supplement (ENSURE ENLIVE / ENSURE PLUS) LIQD Take 237 mLs by mouth 2 (two) times daily between meals. Patient not taking: Reported on 11/30/2023 01/24/23   Adhikari, Amrit, MD  finasteride (PROSCAR) 5 MG tablet Take 5 mg by mouth daily.    [provider]  melatonin 3 MG TABS tablet Take 3 mg by mouth at bedtime.    [provider]  methocarbamol  (ROBAXIN ) 500 MG tablet Take 500 mg by mouth daily at 12 noon.    [provider]  Multiple Vitamins-Minerals (MULTIVITAL PO) Take 1 tablet by mouth daily.    [provider]  Vitamin D, Cholecalciferol, 25 MCG (1000 UT) CAPS Take 1 tablet by mouth daily.    [provider]    Allergies: Patient has no known allergies.    Review of Systems Level 5 caveat for dementia  Updated Vital Signs BP 124/76   Pulse 88   Temp (!) 97.5 F (36.4 C)  (Oral)   Resp 20   SpO2 100%   Physical Exam Vitals and nursing note reviewed.  Constitutional:      General: He is not in acute distress.    Appearance: He is well-developed. He is not diaphoretic.     Interventions: Cervical collar in place.  HENT:     Head: Normocephalic and atraumatic.     Nose: Nose normal.     Mouth/Throat:     Mouth: Mucous membranes are dry.  Eyes:     Pupils: Pupils are equal, round, and reactive to light.  Cardiovascular:     Rate and Rhythm: Normal rate and regular rhythm.     Heart sounds: Normal heart sounds.  Pulmonary:     Effort: Pulmonary effort is normal.     Breath sounds: Normal breath sounds.  Abdominal:     Palpations: Abdomen is soft.     Tenderness: There is no abdominal tenderness.  Musculoskeletal:        General: Swelling present. No tenderness, deformity or signs of injury.     Cervical back: Normal range of motion and neck supple.     Right lower leg: No edema.     Left lower leg: No edema.     Comments: Swelling to right hand, no tenderness or crepitus   Skin:  General: Skin is warm and dry.     Findings: No bruising, erythema or rash.  Neurological:     General: No focal deficit present.     Mental Status: He is alert.  Psychiatric:        Behavior: Behavior normal.     (all labs ordered are listed, but only abnormal results are displayed) Labs Reviewed  COMPREHENSIVE METABOLIC PANEL WITH GFR - Abnormal; Notable for the following components:      Result Value   Glucose, Bld 108 (*)    BUN 29 (*)    Creatinine, Ser 1.73 (*)    Calcium  8.7 (*)    GFR, Estimated 38 (*)    All other components within normal limits  CBC WITH DIFFERENTIAL/PLATELET - Abnormal; Notable for the following components:   RBC 3.28 (*)    Hemoglobin 10.1 (*)    HCT 31.9 (*)    All other components within normal limits  URINALYSIS, W/ REFLEX TO CULTURE (INFECTION SUSPECTED) - Abnormal; Notable for the following components:   Color, Urine  STRAW (*)    All other components within normal limits  VALPROIC ACID  LEVEL - Abnormal; Notable for the following components:   Valproic Acid  Lvl <10 (*)    All other components within normal limits    EKG: None  Radiology: [facility] Chest Port 1 View Result Date: 03/22/2024 CLINICAL DATA:  Recent fall EXAM: PORTABLE CHEST 1 VIEW COMPARISON:  12/23/2023 FINDINGS: Cardiac shadow is stable. Lungs are well aerated bilaterally. No focal infiltrate or effusion is seen. No bony abnormality is noted. IMPRESSION: No acute abnormality seen. Electronically Signed   By: Oneil Devonshire M.D.   On: 03/22/2024 00:59   DG Hand Complete Right Result Date: 03/22/2024 CLINICAL DATA:  Recent fall with right hand pain, initial encounter EXAM: RIGHT HAND - COMPLETE 3+ VIEW COMPARISON:  03/17/2024 FINDINGS: Oblique fractures are again noted through the fourth and fifth metacarpals. Only minimal displacement is seen. Mild interphalangeal degenerative changes are noted. No soft tissue abnormality is seen. IMPRESSION: Stable appearing fourth and fifth metacarpal fractures. Electronically Signed   By: Oneil Devonshire M.D.   On: 03/22/2024 00:59   CT Head Wo Contrast Result Date: 03/22/2024 CLINICAL DATA:  Recent fall with headaches and neck pain, initial encounter EXAM: CT HEAD WITHOUT CONTRAST CT CERVICAL SPINE WITHOUT CONTRAST TECHNIQUE: Multidetector CT imaging of the head and cervical spine was performed following the standard protocol without intravenous contrast. Multiplanar CT image reconstructions of the cervical spine were also generated. RADIATION DOSE REDUCTION: This exam was performed according to the departmental dose-optimization program which includes automated exposure control, adjustment of the mA and/or kV according to patient size and/or use of iterative reconstruction technique. COMPARISON:  12/23/2023 FINDINGS: CT HEAD FINDINGS Brain: No evidence of acute infarction, hemorrhage, hydrocephalus, extra-axial collection  or mass lesion/mass effect. Mild atrophic changes are noted. Vascular: No hyperdense vessel or unexpected calcification. Skull: Normal. Negative for fracture or focal lesion. Sinuses/Orbits: No acute finding. Other: None. CT CERVICAL SPINE FINDINGS Alignment: Mild kyphosis at the cervicothoracic junction. Skull base and vertebrae: 7 cervical segments are well visualized. Vertebral body height is well maintained. Mild facet hypertrophic changes are seen. No acute fracture or acute facet abnormality is noted. Soft tissues and spinal canal: Surrounding soft tissue structures show evidence of a 12 mm right thyroid  nodule. Given the patient's age, no further follow-up is recommended. No other soft tissue abnormality is noted. Upper chest: Visualized lung apices demonstrate emphysematous change. No focal  infiltrate is seen. Other: None IMPRESSION: Degenerative change without acute abnormality. Incidental right thyroid  nodule measuring 1.2 cm. No follow-up imaging is recommended. Reference: J Am Coll Radiol. 2015 Feb;12(2): 143-50 Electronically Signed   By: Oneil Devonshire M.D.   On: 03/22/2024 00:57   CT Cervical Spine Wo Contrast Result Date: 03/22/2024 CLINICAL DATA:  Recent fall with headaches and neck pain, initial encounter EXAM: CT HEAD WITHOUT CONTRAST CT CERVICAL SPINE WITHOUT CONTRAST TECHNIQUE: Multidetector CT imaging of the head and cervical spine was performed following the standard protocol without intravenous contrast. Multiplanar CT image reconstructions of the cervical spine were also generated. RADIATION DOSE REDUCTION: This exam was performed according to the departmental dose-optimization program which includes automated exposure control, adjustment of the mA and/or kV according to patient size and/or use of iterative reconstruction technique. COMPARISON:  12/23/2023 FINDINGS: CT HEAD FINDINGS Brain: No evidence of acute infarction, hemorrhage, hydrocephalus, extra-axial collection or mass lesion/mass  effect. Mild atrophic changes are noted. Vascular: No hyperdense vessel or unexpected calcification. Skull: Normal. Negative for fracture or focal lesion. Sinuses/Orbits: No acute finding. Other: None. CT CERVICAL SPINE FINDINGS Alignment: Mild kyphosis at the cervicothoracic junction. Skull base and vertebrae: 7 cervical segments are well visualized. Vertebral body height is well maintained. Mild facet hypertrophic changes are seen. No acute fracture or acute facet abnormality is noted. Soft tissues and spinal canal: Surrounding soft tissue structures show evidence of a 12 mm right thyroid  nodule. Given the patient's age, no further follow-up is recommended. No other soft tissue abnormality is noted. Upper chest: Visualized lung apices demonstrate emphysematous change. No focal infiltrate is seen. Other: None IMPRESSION: Degenerative change without acute abnormality. Incidental right thyroid  nodule measuring 1.2 cm. No follow-up imaging is recommended. Reference: J Am Coll Radiol. 2015 Feb;12(2): 143-50 Electronically Signed   By: Oneil Devonshire M.D.   On: 03/22/2024 00:57   DG Pelvis Portable Result Date: 03/22/2024 CLINICAL DATA:  Recent fall with pelvic pain, initial encounter EXAM: PORTABLE PELVIS 1 VIEWS COMPARISON:  12/23/2023 FINDINGS: Pelvic ring is intact. No acute fracture or dislocation is seen. No soft tissue changes noted. IMPRESSION: No acute abnormality seen. Electronically Signed   By: Oneil Devonshire M.D.   On: 03/22/2024 00:49     Procedures   Medications Ordered in the ED  sodium chloride  0.9 % bolus 500 mL (0 mLs Intravenous Stopped 03/22/24 0257)  sodium chloride  0.9 % bolus 500 mL (500 mLs Intravenous New Bag/Given 03/22/24 0456)                                    Medical Decision Making Amount and/or Complexity of Data Reviewed Labs: ordered. Radiology: ordered.   This patient presents to the ED for concern of possible fall, this involves an extensive number of treatment  options, and is a complaint that carries with it a high risk of complications and morbidity.  The differential diagnosis includes but not limited to intracranial injury, c-spine injury, metabolic/electrolyte abnormality    Co morbidities / Chronic conditions that complicate the patient evaluation  DM, HTN, dementia    Additional history obtained:  Additional history obtained from EMR, EMS External records from outside source obtained and reviewed including prior labs and imaging on file   Lab Tests:  I Ordered, and personally interpreted labs.  The pertinent results include: CBC with hemoglobin 10.1, gradual downtrend over the past few months, patient is not anticoagulated.  CMP with increased creatinine to 1.73, previously 1.2.  Urinalysis without evidence of infection.  Depakote  level subtherapeutic   Imaging Studies ordered:  I ordered imaging studies including CT head, CT C-spine, portable chest, portable pelvis I independently visualized and interpreted imaging which showed no acute process I agree with the radiologist interpretation   Problem List / ED Course / Critical interventions / Medication management  83 year old male brought in by EMS from facility where patient was found on the floor by his daughter, concern for possible fall although there is no evidence of trauma.  History of dementia, patient is unable to provide any history today.  He is alert to name only.  Range of motion of extremities without evidence of pain.  He does have some swelling in his right hand, record review shows that he has a known metacarpal fracture last seen by Ortho a week ago and is supposed to be wearing a TKO splint.  Recommend patient resume splint application (daughter states patient removes his own splint at facility).  Imaging without any evidence of traumatic findings today.  His lab work reveals a mild hemoglobin, gradual downtrend.  Not on thinners, recommend recheck with PCP, consider  Hemoccult.  CMP with gradual uptrend in creatinine, currently 1.73, previously 1.2.  Was provided with IV fluid bolus.  Blood pressures have been normal to slightly low, will recommend discontinuing patient's amlodipine .  Depakote  is subtherapeutic. I ordered medication including IVF   Reevaluation of the patient after these medicines showed that the patient remained stable  I have reviewed the patients home medicines and have made adjustments as needed   Consultations Obtained:  I requested consultation with the ER attending, Dr. Griselda,  and discussed lab and imaging findings as well as pertinent plan - they recommend: has seen the patient, spoken with daughter who is now at bedside. Recommends additional fluid bolus prior to dc back to facility with plan to follow up with PCP for recheck, dc amlodipine .    Social Determinants of Health:  Lives at facility    Test / Admission - Considered:  Dc back to facility       Final diagnoses:  Fall, initial encounter  Elevated serum creatinine  Anemia, unspecified type    ED Discharge Orders     None          Chase Mccarthy 03/22/24 0610    Griselda Norris, MD 03/22/24 [phone]

## 2024-03-22 NOTE — ED Notes (Signed)
 This RN attempted to call report back to [place] with no answer.

## 2024-03-22 NOTE — ED Notes (Signed)
 Pt attempting to get out of bed multiple times. Not able to be reoriented. Posie belt placed on pt by ED staff.

## 2024-03-22 NOTE — ED Notes (Signed)
 Pt's daughter at bedside. This RN removed posie belt from pt. Pt's daughter agreed to stay with pt.

## 2024-03-24 DIAGNOSIS — J984 Other disorders of lung: Secondary | ICD-10-CM | POA: Diagnosis not present

## 2024-03-24 DIAGNOSIS — R41841 Cognitive communication deficit: Secondary | ICD-10-CM | POA: Diagnosis not present

## 2024-03-24 DIAGNOSIS — N138 Other obstructive and reflux uropathy: Secondary | ICD-10-CM | POA: Diagnosis not present

## 2024-03-24 DIAGNOSIS — E119 Type 2 diabetes mellitus without complications: Secondary | ICD-10-CM | POA: Diagnosis not present

## 2024-03-24 DIAGNOSIS — G9340 Encephalopathy, unspecified: Secondary | ICD-10-CM | POA: Diagnosis not present

## 2024-03-24 DIAGNOSIS — E568 Deficiency of other vitamins: Secondary | ICD-10-CM | POA: Diagnosis not present

## 2024-03-24 DIAGNOSIS — N183 Chronic kidney disease, stage 3 unspecified: Secondary | ICD-10-CM | POA: Diagnosis not present

## 2024-03-24 DIAGNOSIS — R1311 Dysphagia, oral phase: Secondary | ICD-10-CM | POA: Diagnosis not present

## 2024-03-24 DIAGNOSIS — E785 Hyperlipidemia, unspecified: Secondary | ICD-10-CM | POA: Diagnosis not present

## 2024-03-24 DIAGNOSIS — M6281 Muscle weakness (generalized): Secondary | ICD-10-CM | POA: Diagnosis not present

## 2024-03-24 DIAGNOSIS — E43 Unspecified severe protein-calorie malnutrition: Secondary | ICD-10-CM | POA: Diagnosis not present

## 2024-03-24 DIAGNOSIS — N401 Enlarged prostate with lower urinary tract symptoms: Secondary | ICD-10-CM | POA: Diagnosis not present

## 2024-03-24 DIAGNOSIS — I129 Hypertensive chronic kidney disease with stage 1 through stage 4 chronic kidney disease, or unspecified chronic kidney disease: Secondary | ICD-10-CM | POA: Diagnosis not present

## 2024-03-25 DIAGNOSIS — M6281 Muscle weakness (generalized): Secondary | ICD-10-CM | POA: Diagnosis not present

## 2024-03-25 DIAGNOSIS — E785 Hyperlipidemia, unspecified: Secondary | ICD-10-CM | POA: Diagnosis not present

## 2024-03-25 DIAGNOSIS — R1310 Dysphagia, unspecified: Secondary | ICD-10-CM | POA: Diagnosis not present

## 2024-03-25 DIAGNOSIS — N183 Chronic kidney disease, stage 3 unspecified: Secondary | ICD-10-CM | POA: Diagnosis not present

## 2024-03-25 DIAGNOSIS — N401 Enlarged prostate with lower urinary tract symptoms: Secondary | ICD-10-CM | POA: Diagnosis not present

## 2024-03-25 DIAGNOSIS — E568 Deficiency of other vitamins: Secondary | ICD-10-CM | POA: Diagnosis not present

## 2024-03-25 DIAGNOSIS — J984 Other disorders of lung: Secondary | ICD-10-CM | POA: Diagnosis not present

## 2024-03-25 DIAGNOSIS — R1311 Dysphagia, oral phase: Secondary | ICD-10-CM | POA: Diagnosis not present

## 2024-03-25 DIAGNOSIS — N138 Other obstructive and reflux uropathy: Secondary | ICD-10-CM | POA: Diagnosis not present

## 2024-03-25 DIAGNOSIS — E43 Unspecified severe protein-calorie malnutrition: Secondary | ICD-10-CM | POA: Diagnosis not present

## 2024-03-25 DIAGNOSIS — R41841 Cognitive communication deficit: Secondary | ICD-10-CM | POA: Diagnosis not present

## 2024-03-25 DIAGNOSIS — E1122 Type 2 diabetes mellitus with diabetic chronic kidney disease: Secondary | ICD-10-CM | POA: Diagnosis not present

## 2024-03-25 DIAGNOSIS — I129 Hypertensive chronic kidney disease with stage 1 through stage 4 chronic kidney disease, or unspecified chronic kidney disease: Secondary | ICD-10-CM | POA: Diagnosis not present

## 2024-03-26 DIAGNOSIS — R41841 Cognitive communication deficit: Secondary | ICD-10-CM | POA: Diagnosis not present

## 2024-03-26 DIAGNOSIS — I129 Hypertensive chronic kidney disease with stage 1 through stage 4 chronic kidney disease, or unspecified chronic kidney disease: Secondary | ICD-10-CM | POA: Diagnosis not present

## 2024-03-26 DIAGNOSIS — N401 Enlarged prostate with lower urinary tract symptoms: Secondary | ICD-10-CM | POA: Diagnosis not present

## 2024-03-26 DIAGNOSIS — E43 Unspecified severe protein-calorie malnutrition: Secondary | ICD-10-CM | POA: Diagnosis not present

## 2024-03-26 DIAGNOSIS — E785 Hyperlipidemia, unspecified: Secondary | ICD-10-CM | POA: Diagnosis not present

## 2024-03-26 DIAGNOSIS — E1122 Type 2 diabetes mellitus with diabetic chronic kidney disease: Secondary | ICD-10-CM | POA: Diagnosis not present

## 2024-03-26 DIAGNOSIS — N183 Chronic kidney disease, stage 3 unspecified: Secondary | ICD-10-CM | POA: Diagnosis not present

## 2024-03-26 DIAGNOSIS — N138 Other obstructive and reflux uropathy: Secondary | ICD-10-CM | POA: Diagnosis not present

## 2024-03-26 DIAGNOSIS — M6281 Muscle weakness (generalized): Secondary | ICD-10-CM | POA: Diagnosis not present

## 2024-03-26 DIAGNOSIS — J984 Other disorders of lung: Secondary | ICD-10-CM | POA: Diagnosis not present

## 2024-03-26 DIAGNOSIS — R1311 Dysphagia, oral phase: Secondary | ICD-10-CM | POA: Diagnosis not present

## 2024-03-26 DIAGNOSIS — E568 Deficiency of other vitamins: Secondary | ICD-10-CM | POA: Diagnosis not present

## 2024-03-28 DIAGNOSIS — I10 Essential (primary) hypertension: Secondary | ICD-10-CM | POA: Diagnosis not present

## 2024-03-28 DIAGNOSIS — N138 Other obstructive and reflux uropathy: Secondary | ICD-10-CM | POA: Diagnosis not present

## 2024-03-28 DIAGNOSIS — E785 Hyperlipidemia, unspecified: Secondary | ICD-10-CM | POA: Diagnosis not present

## 2024-03-28 DIAGNOSIS — E568 Deficiency of other vitamins: Secondary | ICD-10-CM | POA: Diagnosis not present

## 2024-03-28 DIAGNOSIS — N401 Enlarged prostate with lower urinary tract symptoms: Secondary | ICD-10-CM | POA: Diagnosis not present

## 2024-03-28 DIAGNOSIS — E119 Type 2 diabetes mellitus without complications: Secondary | ICD-10-CM | POA: Diagnosis not present

## 2024-03-28 DIAGNOSIS — E43 Unspecified severe protein-calorie malnutrition: Secondary | ICD-10-CM | POA: Diagnosis not present

## 2024-03-28 DIAGNOSIS — N183 Chronic kidney disease, stage 3 unspecified: Secondary | ICD-10-CM | POA: Diagnosis not present

## 2024-03-28 DIAGNOSIS — J984 Other disorders of lung: Secondary | ICD-10-CM | POA: Diagnosis not present

## 2024-03-29 DIAGNOSIS — M79643 Pain in unspecified hand: Secondary | ICD-10-CM | POA: Diagnosis not present

## 2024-03-29 DIAGNOSIS — S62356D Nondisplaced fracture of shaft of fifth metacarpal bone, right hand, subsequent encounter for fracture with routine healing: Secondary | ICD-10-CM | POA: Insufficient documentation

## 2024-03-29 DIAGNOSIS — M25511 Pain in right shoulder: Secondary | ICD-10-CM | POA: Diagnosis not present

## 2024-03-29 DIAGNOSIS — J439 Emphysema, unspecified: Secondary | ICD-10-CM | POA: Diagnosis not present

## 2024-03-29 DIAGNOSIS — I10 Essential (primary) hypertension: Secondary | ICD-10-CM | POA: Diagnosis not present

## 2024-03-29 DIAGNOSIS — Z7982 Long term (current) use of aspirin: Secondary | ICD-10-CM | POA: Diagnosis not present

## 2024-03-29 DIAGNOSIS — S62314D Displaced fracture of base of fourth metacarpal bone, right hand, subsequent encounter for fracture with routine healing: Secondary | ICD-10-CM | POA: Diagnosis not present

## 2024-03-29 DIAGNOSIS — S199XXA Unspecified injury of neck, initial encounter: Secondary | ICD-10-CM | POA: Diagnosis not present

## 2024-03-29 DIAGNOSIS — E119 Type 2 diabetes mellitus without complications: Secondary | ICD-10-CM | POA: Insufficient documentation

## 2024-03-29 DIAGNOSIS — M79641 Pain in right hand: Secondary | ICD-10-CM | POA: Diagnosis not present

## 2024-03-29 DIAGNOSIS — Z043 Encounter for examination and observation following other accident: Secondary | ICD-10-CM | POA: Diagnosis not present

## 2024-03-29 DIAGNOSIS — M25541 Pain in joints of right hand: Secondary | ICD-10-CM | POA: Diagnosis not present

## 2024-03-29 DIAGNOSIS — I6523 Occlusion and stenosis of bilateral carotid arteries: Secondary | ICD-10-CM | POA: Diagnosis not present

## 2024-03-29 DIAGNOSIS — W19XXXA Unspecified fall, initial encounter: Secondary | ICD-10-CM | POA: Diagnosis not present

## 2024-03-29 DIAGNOSIS — F0390 Unspecified dementia without behavioral disturbance: Secondary | ICD-10-CM | POA: Insufficient documentation

## 2024-03-29 DIAGNOSIS — W19XXXD Unspecified fall, subsequent encounter: Secondary | ICD-10-CM | POA: Diagnosis not present

## 2024-03-29 DIAGNOSIS — S0990XA Unspecified injury of head, initial encounter: Secondary | ICD-10-CM | POA: Diagnosis not present

## 2024-03-29 DIAGNOSIS — S62306D Unspecified fracture of fifth metacarpal bone, right hand, subsequent encounter for fracture with routine healing: Secondary | ICD-10-CM | POA: Diagnosis not present

## 2024-03-29 DIAGNOSIS — S62606D Fracture of unspecified phalanx of right little finger, subsequent encounter for fracture with routine healing: Secondary | ICD-10-CM | POA: Diagnosis not present

## 2024-03-29 NOTE — ED Triage Notes (Signed)
 83 y/o male comes in by EMS after an unwitnessed fall out of his wheelchair. Per EMS, pt was found lying on the ground in between the doorway of his room and c/o right shoulder and hand pain. PT has dementia at baseline and is unable to give any information regarding today's admission. Pt is alert and alert to self only. Pt unable to qualify or quantify his pain; however, this writer notes no obvious injury or trauma and pt is able to move all 4 extremities without difficulty.

## 2024-03-30 ENCOUNTER — Emergency Department (HOSPITAL_COMMUNITY)

## 2024-03-30 ENCOUNTER — Emergency Department (HOSPITAL_COMMUNITY)
Admission: EM | Admit: 2024-03-30 | Discharge: 2024-03-30 | Disposition: A | Discharge status: Home / Self Care | Attending: Emergency Medicine | Admitting: Emergency Medicine

## 2024-03-30 ENCOUNTER — Other Ambulatory Visit: Payer: Self-pay

## 2024-03-30 ENCOUNTER — Encounter (HOSPITAL_COMMUNITY): Payer: Self-pay | Admitting: Emergency Medicine

## 2024-03-30 DIAGNOSIS — Z7401 Bed confinement status: Secondary | ICD-10-CM | POA: Diagnosis not present

## 2024-03-30 DIAGNOSIS — Z043 Encounter for examination and observation following other accident: Secondary | ICD-10-CM | POA: Diagnosis not present

## 2024-03-30 DIAGNOSIS — I6523 Occlusion and stenosis of bilateral carotid arteries: Secondary | ICD-10-CM | POA: Diagnosis not present

## 2024-03-30 DIAGNOSIS — S0990XA Unspecified injury of head, initial encounter: Secondary | ICD-10-CM | POA: Diagnosis not present

## 2024-03-30 DIAGNOSIS — S62306D Unspecified fracture of fifth metacarpal bone, right hand, subsequent encounter for fracture with routine healing: Secondary | ICD-10-CM | POA: Diagnosis not present

## 2024-03-30 DIAGNOSIS — S199XXA Unspecified injury of neck, initial encounter: Secondary | ICD-10-CM | POA: Diagnosis not present

## 2024-03-30 DIAGNOSIS — S62606D Fracture of unspecified phalanx of right little finger, subsequent encounter for fracture with routine healing: Secondary | ICD-10-CM | POA: Diagnosis not present

## 2024-03-30 DIAGNOSIS — S62356D Nondisplaced fracture of shaft of fifth metacarpal bone, right hand, subsequent encounter for fracture with routine healing: Secondary | ICD-10-CM

## 2024-03-30 DIAGNOSIS — S62314D Displaced fracture of base of fourth metacarpal bone, right hand, subsequent encounter for fracture with routine healing: Secondary | ICD-10-CM | POA: Diagnosis not present

## 2024-03-30 DIAGNOSIS — R531 Weakness: Secondary | ICD-10-CM | POA: Diagnosis not present

## 2024-03-30 DIAGNOSIS — W19XXXA Unspecified fall, initial encounter: Secondary | ICD-10-CM

## 2024-03-30 DIAGNOSIS — J439 Emphysema, unspecified: Secondary | ICD-10-CM | POA: Diagnosis not present

## 2024-03-30 NOTE — ED Provider Notes (Signed)
 [place] EMERGENCY DEPARTMENT AT [facility] Provider Note   CSN: [id number] Arrival date & time: 03/29/24  2350     Patient presents with: Chase Mccarthy is a 83 y.o. male.   The history is provided by the EMS personnel and the patient. The history is limited by the condition of the patient (Dementia).  Fall  He has history of hypertension, diabetes, hyperlipidemia, dementia and was sent in by ambulance after an unwitnessed fall at the facility where he resides.  He is reported to be complaining of right shoulder and hand pain although he has no complaints when I talked to him.  He is not on any anticoagulants.    Prior to Admission medications   Medication Sig Start Date End Date Taking? Authorizing Provider  acetaminophen  (TYLENOL ) 650 MG CR tablet Take 650 mg by mouth in the morning and at bedtime.    [provider]  aspirin  EC 81 MG tablet Take 1 tablet (81 mg total) by mouth daily. Swallow whole. 01/25/23   Jillian Buttery, MD  cyanocobalamin  (VITAMIN B12) 1000 MCG tablet Take 1 tablet (1,000 mcg total) by mouth daily. Follow with outpatient provider for follow up of B12 level. 12/06/23   Perri DELENA Meliton Mickey., MD  divalproex  (DEPAKOTE  SPRINKLE) 125 MG capsule Take 125 mg by mouth at bedtime.    [provider]  feeding supplement (ENSURE ENLIVE / ENSURE PLUS) LIQD Take 237 mLs by mouth 2 (two) times daily between meals. Patient not taking: Reported on 11/30/2023 01/24/23   Adhikari, Amrit, MD  finasteride (PROSCAR) 5 MG tablet Take 5 mg by mouth daily.    [provider]  melatonin 3 MG TABS tablet Take 3 mg by mouth at bedtime.    [provider]  methocarbamol  (ROBAXIN ) 500 MG tablet Take 500 mg by mouth daily at 12 noon.    [provider]  Multiple Vitamins-Minerals (MULTIVITAL PO) Take 1 tablet by mouth daily.    [provider]  Vitamin D, Cholecalciferol, 25 MCG (1000 UT) CAPS Take 1 tablet by mouth  daily.    [provider]    Allergies: Patient has no known allergies.    Review of Systems  Unable to perform ROS: Dementia    Updated Vital Signs BP 135/71   Pulse 74   Temp 98.4 F (36.9 C) (Oral)   Resp 17   Ht 6' (1.829 m)   Wt 68 kg   SpO2 95%   BMI 20.34 kg/m   Physical Exam Vitals and nursing note reviewed.   83 year old male, resting comfortably and in no acute distress. Vital signs are normal. Oxygen saturation is 95%, which is normal. Head is normocephalic and atraumatic. PERRLA, EOMI.  Neck is nontender. Back is nontender. Lungs are clear without rales, wheezes, or rhonchi. Chest is nontender. Heart has regular rate and rhythm without murmur. Abdomen is soft, flat, nontender. Extremities have no swelling or deformity.  No tenderness is elicited anywhere.  There is full range of motion of all joints although generalized increased muscle tone is noted diffusely.  There is 2+ pedal edema bilaterally. Skin is warm and dry without rash. Neurologic: Awake and alert, oriented to person but not place or time.  Moves all extremities equally.   Radiology: DG Hand Complete Right Result Date: 03/30/2024 CLINICAL DATA:  fall unwitnessed fall out of his wheelchair. EXAM: RIGHT HAND - COMPLETE 3+ VIEW COMPARISON:  X-ray right hand 03/22/2024, 2 right  hand 03/03/2024 FINDINGS: Redemonstration of acute half shaft width displaced diagonal fourth and fifth digit metacarpal body fractures. Associated developing periosteal reaction. No acute displaced fracture. No dislocation. No evidence of arthropathy or other focal bone abnormality. Soft tissues are unremarkable. IMPRESSION: 1.  No acute displaced fracture or dislocation. 2. Healing subacute fourth and fifth digit metatarsal body fractures. Electronically Signed   By: Morgane  Naveau M.D.   On: 03/30/2024 01:29   DG Shoulder Right Result Date: 03/30/2024 CLINICAL DATA:  fall EXAM: RIGHT SHOULDER - 2+ VIEW COMPARISON:   Chest x-ray 03/22/2024 FINDINGS: There is no evidence of fracture or dislocation. There is no evidence of arthropathy or other focal bone abnormality. Soft tissues are unremarkable. Chronic coarsened interstitial markings of the visualized right upper lobe. IMPRESSION: No acute displaced fracture or dislocation. Electronically Signed   By: Morgane  Naveau M.D.   On: 03/30/2024 01:25   CT Head Wo Contrast Result Date: 03/30/2024 CLINICAL DATA:  Head trauma, minor (Age >= 65y); Neck trauma (Age >= 65y) EXAM: CT HEAD WITHOUT CONTRAST CT CERVICAL SPINE WITHOUT CONTRAST TECHNIQUE: Multidetector CT imaging of the head and cervical spine was performed following the standard protocol without intravenous contrast. Multiplanar CT image reconstructions of the cervical spine were also generated. RADIATION DOSE REDUCTION: This exam was performed according to the departmental dose-optimization program which includes automated exposure control, adjustment of the mA and/or kV according to patient size and/or use of iterative reconstruction technique. COMPARISON:  None Available. FINDINGS: CT HEAD FINDINGS Brain: No evidence of large-territorial acute infarction. No parenchymal hemorrhage. No mass lesion. No extra-axial collection. No mass effect or midline shift. No hydrocephalus. Basilar cisterns are patent. Vascular: No hyperdense vessel. Atherosclerotic calcifications are present within the cavernous internal carotid arteries. Skull: No acute fracture or focal lesion. Sinuses/Orbits: Paranasal sinuses and mastoid air cells are clear. Bilateral lens replacement. Otherwise the orbits are unremarkable. Other: None. CT CERVICAL SPINE FINDINGS Alignment: Reversal of normal cervical lordosis likely due to positioning. Skull base and vertebrae: Multilevel moderate degenerative changes of the spine. Moderate severe osseous neural foraminal stenosis of the left C3-C4 and C4-C5 level. No severe osseous central canal stenosis. Central  canal stenosis. No acute fracture. No aggressive appearing focal osseous lesion or focal pathologic process. Soft tissues and spinal canal: No prevertebral fluid or swelling. No visible canal hematoma. Upper chest: Biapical pleural/pulmonary scarring. Mild paraseptal emphysematous changes. Other: None. IMPRESSION: 1. No acute intracranial abnormality. 2. No acute displaced fracture or traumatic listhesis of the cervical spine. 3.  Emphysema (ICD10-J43.9). Electronically Signed   By: Morgane  Naveau M.D.   On: 03/30/2024 01:18   CT Cervical Spine Wo Contrast Result Date: 03/30/2024 CLINICAL DATA:  Head trauma, minor (Age >= 65y); Neck trauma (Age >= 65y) EXAM: CT HEAD WITHOUT CONTRAST CT CERVICAL SPINE WITHOUT CONTRAST TECHNIQUE: Multidetector CT imaging of the head and cervical spine was performed following the standard protocol without intravenous contrast. Multiplanar CT image reconstructions of the cervical spine were also generated. RADIATION DOSE REDUCTION: This exam was performed according to the departmental dose-optimization program which includes automated exposure control, adjustment of the mA and/or kV according to patient size and/or use of iterative reconstruction technique. COMPARISON:  None Available. FINDINGS: CT HEAD FINDINGS Brain: No evidence of large-territorial acute infarction. No parenchymal hemorrhage. No mass lesion. No extra-axial collection. No mass effect or midline shift. No hydrocephalus. Basilar cisterns are patent. Vascular: No hyperdense vessel. Atherosclerotic calcifications are present within the cavernous internal carotid arteries. Skull: No acute fracture  or focal lesion. Sinuses/Orbits: Paranasal sinuses and mastoid air cells are clear. Bilateral lens replacement. Otherwise the orbits are unremarkable. Other: None. CT CERVICAL SPINE FINDINGS Alignment: Reversal of normal cervical lordosis likely due to positioning. Skull base and vertebrae: Multilevel moderate degenerative  changes of the spine. Moderate severe osseous neural foraminal stenosis of the left C3-C4 and C4-C5 level. No severe osseous central canal stenosis. Central canal stenosis. No acute fracture. No aggressive appearing focal osseous lesion or focal pathologic process. Soft tissues and spinal canal: No prevertebral fluid or swelling. No visible canal hematoma. Upper chest: Biapical pleural/pulmonary scarring. Mild paraseptal emphysematous changes. Other: None. IMPRESSION: 1. No acute intracranial abnormality. 2. No acute displaced fracture or traumatic listhesis of the cervical spine. 3.  Emphysema (ICD10-J43.9). Electronically Signed   By: Morgane  Naveau M.D.   On: 03/30/2024 01:18     Procedures   Medications Ordered in the ED - No data to display                                  Medical Decision Making Amount and/or Complexity of Data Reviewed Radiology: ordered.   Fall with report of injury to right shoulder and right hand.  No evidence of significant trauma on my exam, but I have ordered x-rays.  Also, because of uncertainty of history, I have ordered CT of head and cervical spine.  I have reviewed his past records, and note ED visits on 03/21/2024 and 12/23/2023 for falls.  CT scans show no evidence of acute injury, shoulder x-ray shows no evidence of fracture, hand x-ray shows routine healing of known fractures of the 4th and 5th metacarpals.  Have independently viewed all of the images, and agree with the radiologist's interpretation.  Initial x-ray showing metacarpal fracture was on 03/03/2024.  I am discharging him back to his skilled nursing facility.     Final diagnoses:  Fall at nursing home, initial encounter  Closed fracture of base of fourth metacarpal bone of right hand with routine healing, subsequent encounter  Closed nondisplaced fracture of shaft of fifth metacarpal bone of right hand with routine healing, subsequent encounter    ED Discharge Orders     None           Raford Lenis, MD 03/30/24 [phone]

## 2024-03-31 ENCOUNTER — Encounter: Payer: Self-pay | Admitting: Gastroenterology

## 2024-03-31 DIAGNOSIS — N138 Other obstructive and reflux uropathy: Secondary | ICD-10-CM | POA: Diagnosis not present

## 2024-03-31 DIAGNOSIS — N401 Enlarged prostate with lower urinary tract symptoms: Secondary | ICD-10-CM | POA: Diagnosis not present

## 2024-03-31 DIAGNOSIS — J984 Other disorders of lung: Secondary | ICD-10-CM | POA: Diagnosis not present

## 2024-03-31 DIAGNOSIS — E568 Deficiency of other vitamins: Secondary | ICD-10-CM | POA: Diagnosis not present

## 2024-03-31 DIAGNOSIS — N183 Chronic kidney disease, stage 3 unspecified: Secondary | ICD-10-CM | POA: Diagnosis not present

## 2024-03-31 DIAGNOSIS — E119 Type 2 diabetes mellitus without complications: Secondary | ICD-10-CM | POA: Diagnosis not present

## 2024-03-31 DIAGNOSIS — E43 Unspecified severe protein-calorie malnutrition: Secondary | ICD-10-CM | POA: Diagnosis not present

## 2024-03-31 DIAGNOSIS — E785 Hyperlipidemia, unspecified: Secondary | ICD-10-CM | POA: Diagnosis not present

## 2024-03-31 DIAGNOSIS — I10 Essential (primary) hypertension: Secondary | ICD-10-CM | POA: Diagnosis not present

## 2024-04-02 DIAGNOSIS — E43 Unspecified severe protein-calorie malnutrition: Secondary | ICD-10-CM | POA: Diagnosis not present

## 2024-04-02 DIAGNOSIS — N401 Enlarged prostate with lower urinary tract symptoms: Secondary | ICD-10-CM | POA: Diagnosis not present

## 2024-04-02 DIAGNOSIS — E119 Type 2 diabetes mellitus without complications: Secondary | ICD-10-CM | POA: Diagnosis not present

## 2024-04-02 DIAGNOSIS — J984 Other disorders of lung: Secondary | ICD-10-CM | POA: Diagnosis not present

## 2024-04-02 DIAGNOSIS — I10 Essential (primary) hypertension: Secondary | ICD-10-CM | POA: Diagnosis not present

## 2024-04-02 DIAGNOSIS — N138 Other obstructive and reflux uropathy: Secondary | ICD-10-CM | POA: Diagnosis not present

## 2024-04-02 DIAGNOSIS — E568 Deficiency of other vitamins: Secondary | ICD-10-CM | POA: Diagnosis not present

## 2024-04-02 DIAGNOSIS — N183 Chronic kidney disease, stage 3 unspecified: Secondary | ICD-10-CM | POA: Diagnosis not present

## 2024-04-02 DIAGNOSIS — E785 Hyperlipidemia, unspecified: Secondary | ICD-10-CM | POA: Diagnosis not present

## 2024-04-04 DIAGNOSIS — N401 Enlarged prostate with lower urinary tract symptoms: Secondary | ICD-10-CM | POA: Diagnosis not present

## 2024-04-04 DIAGNOSIS — E43 Unspecified severe protein-calorie malnutrition: Secondary | ICD-10-CM | POA: Diagnosis not present

## 2024-04-04 DIAGNOSIS — I10 Essential (primary) hypertension: Secondary | ICD-10-CM | POA: Diagnosis not present

## 2024-04-04 DIAGNOSIS — N138 Other obstructive and reflux uropathy: Secondary | ICD-10-CM | POA: Diagnosis not present

## 2024-04-04 DIAGNOSIS — J984 Other disorders of lung: Secondary | ICD-10-CM | POA: Diagnosis not present

## 2024-04-04 DIAGNOSIS — E785 Hyperlipidemia, unspecified: Secondary | ICD-10-CM | POA: Diagnosis not present

## 2024-04-04 DIAGNOSIS — E568 Deficiency of other vitamins: Secondary | ICD-10-CM | POA: Diagnosis not present

## 2024-04-04 DIAGNOSIS — N183 Chronic kidney disease, stage 3 unspecified: Secondary | ICD-10-CM | POA: Diagnosis not present

## 2024-04-04 DIAGNOSIS — E119 Type 2 diabetes mellitus without complications: Secondary | ICD-10-CM | POA: Diagnosis not present

## 2024-04-07 ENCOUNTER — Ambulatory Visit: Admitting: Orthopedic Surgery

## 2024-04-07 ENCOUNTER — Ambulatory Visit (INDEPENDENT_AMBULATORY_CARE_PROVIDER_SITE_OTHER): Admitting: Orthopedic Surgery

## 2024-04-07 DIAGNOSIS — S62356A Nondisplaced fracture of shaft of fifth metacarpal bone, right hand, initial encounter for closed fracture: Secondary | ICD-10-CM

## 2024-04-07 DIAGNOSIS — S62324A Displaced fracture of shaft of fourth metacarpal bone, right hand, initial encounter for closed fracture: Secondary | ICD-10-CM

## 2024-04-07 DIAGNOSIS — J984 Other disorders of lung: Secondary | ICD-10-CM | POA: Diagnosis not present

## 2024-04-07 DIAGNOSIS — E43 Unspecified severe protein-calorie malnutrition: Secondary | ICD-10-CM | POA: Diagnosis not present

## 2024-04-07 DIAGNOSIS — N401 Enlarged prostate with lower urinary tract symptoms: Secondary | ICD-10-CM | POA: Diagnosis not present

## 2024-04-07 DIAGNOSIS — N138 Other obstructive and reflux uropathy: Secondary | ICD-10-CM | POA: Diagnosis not present

## 2024-04-07 DIAGNOSIS — E119 Type 2 diabetes mellitus without complications: Secondary | ICD-10-CM | POA: Diagnosis not present

## 2024-04-07 DIAGNOSIS — E785 Hyperlipidemia, unspecified: Secondary | ICD-10-CM | POA: Diagnosis not present

## 2024-04-07 DIAGNOSIS — E568 Deficiency of other vitamins: Secondary | ICD-10-CM | POA: Diagnosis not present

## 2024-04-07 DIAGNOSIS — I129 Hypertensive chronic kidney disease with stage 1 through stage 4 chronic kidney disease, or unspecified chronic kidney disease: Secondary | ICD-10-CM | POA: Diagnosis not present

## 2024-04-07 DIAGNOSIS — N183 Chronic kidney disease, stage 3 unspecified: Secondary | ICD-10-CM | POA: Diagnosis not present

## 2024-04-07 NOTE — Progress Notes (Signed)
 Chase Mccarthy - 83 y.o. male MRN [id number]  Date of birth: 13-Jun-1941  Office Visit Note: Visit Date: 04/07/2024 PCP: Jobie Senior, FNP Referred by: Jobie Senior, FNP  Subjective: No chief complaint on file.  HPI: Chase Mccarthy is a 83 y.o. male who returns today for evaluation of right hand injury sustained approximately 7 weeks prior.  He was diagnosed with metacarpal shaft fractures of the ring and small finger.  He was placed into a TKO ulnar gutter brace at that time, however he presents today without utilization of the brace.  His caregiver is unsure if he has been wearing the brace or not.  He also has had 2 recent falls with, was seen recently in the emergency department setting.  Pertinent ROS were reviewed with the patient and found to be negative unless otherwise specified above in HPI.    Assessment & Plan: Visit Diagnoses:  1. Closed displaced fracture of shaft of fourth metacarpal bone of right hand, initial encounter   2. Closed nondisplaced fracture of shaft of fifth metacarpal bone of right hand, initial encounter      Plan: Repeat x-rays from the emergency department setting 1 week prior once again show persistent metacarpal fracture of the ring and small finger.  On examination today, he is able to perform active digital range of motion of the hand with full composite fist without significant restriction.  There is no evidence of digital overlap or rotational abnormality.  New TKO ulnar gutter brace was given today to the patient given the disappearance of the previous one.  I did emphasize the importance of hygiene and utilization of the brace moving forward.  Continue with immobilization for additional 4 weeks, okay for range of motion outside of brace.  Refrain from weightbearing.  Follow-up in 4 weeks.    Follow-up: No follow-ups on file.   Meds & Orders: No orders of the defined types were placed in this encounter.   No orders of the defined types  were placed in this encounter.    Procedures: No procedures performed      Clinical History: No specialty comments available.  He reports that he has been smoking cigars. He has never used smokeless tobacco.  Recent Labs    12/02/23 1542  HGBA1C 5.6    Objective:   Vital Signs: There were no vitals taken for this visit.  Physical Exam  Gen: Well-appearing, in no acute distress; non-toxic CV: Regular Rate. Well-perfused. Warm.  Resp: Breathing unlabored on room air; no wheezing. Psych: Fluid speech in conversation; appropriate affect; normal thought process  Ortho Exam Right hand: - Moderate swelling over the dorsal aspect of the hand, minimal tenderness over the ring and small finger metacarpal region -Able to perform composite fist, no significant signs of digital overlap, no rotational abnormalities notable to the ring and small finger - Sensation intact distally median/radial/ulnar, AIN/PIN/interosseous intact - Hand remains warm well-perfused  Imaging: No results found.    Past Medical/Family/Surgical/Social History: Medications & Allergies reviewed per EMR, new medications updated. Patient Active Problem List   Diagnosis Date Noted   Acute encephalopathy 11/30/2023   CKD (chronic kidney disease) stage 3, GFR 30-59 ml/min (HCC) 11/30/2023   Protein-calorie malnutrition, severe 01/19/2023   AKI (acute kidney injury) (HCC) 01/18/2023   Hypertension    Diabetes mellitus (HCC)    Restrictive lung disease    Onychomycosis    Essential hypertension 01/13/2011    Class: Chronic   Diabetes mellitus type 2  in nonobese (HCC) 01/13/2011   Vitamin D deficiency 08/17/2009    Class: History of   Benign prostatic hyperplasia with urinary obstruction 01/13/2004    Class: Chronic   Hyperlipidemia with target LDL less than 100 10/28/1999    Class: History of   Tobacco abuse 10/15/1992    Class: Chronic   Past Medical History:  Diagnosis Date   BPH (benign prostatic  hyperplasia)    mild   Dermatitis    axillary   Diabetes mellitus (HCC)    Dyslipidemia    Hypertension    stable   Onychomycosis    Restrictive lung disease    Tobacco use    Tricuspid valve regurgitation    Family History  Problem Relation Age of Onset   Heart failure Mother    Cancer Father    Past Surgical History:  Procedure Laterality Date   CARDIAC CATHETERIZATION  11/20/2003   normal coronaries   NM MYOCAR PERF WALL MOTION  03/27/2012   normal   US  ECHOCARDIOGRAPHY  09/23/2009   mild-mod LVH,EF =>55%,mild MVP,trace MR,mild to mod TR, AOV mildly sclerotic.   Social History   Occupational History   Not on file  Tobacco Use   Smoking status: Every Day    Types: Cigars   Smokeless tobacco: Never   Tobacco comments:    3 cigars daily  Substance and Sexual Activity   Alcohol use: No   Drug use: No   Sexual activity: Not Currently    Braelen Sproule Estela) Arlinda, M.D. [facility], Hand Surgery

## 2024-04-08 DIAGNOSIS — N183 Chronic kidney disease, stage 3 unspecified: Secondary | ICD-10-CM | POA: Diagnosis not present

## 2024-04-08 DIAGNOSIS — E1122 Type 2 diabetes mellitus with diabetic chronic kidney disease: Secondary | ICD-10-CM | POA: Diagnosis not present

## 2024-04-08 DIAGNOSIS — J984 Other disorders of lung: Secondary | ICD-10-CM | POA: Diagnosis not present

## 2024-04-08 DIAGNOSIS — E43 Unspecified severe protein-calorie malnutrition: Secondary | ICD-10-CM | POA: Diagnosis not present

## 2024-04-08 DIAGNOSIS — I129 Hypertensive chronic kidney disease with stage 1 through stage 4 chronic kidney disease, or unspecified chronic kidney disease: Secondary | ICD-10-CM | POA: Diagnosis not present

## 2024-04-08 DIAGNOSIS — N138 Other obstructive and reflux uropathy: Secondary | ICD-10-CM | POA: Diagnosis not present

## 2024-04-08 DIAGNOSIS — N401 Enlarged prostate with lower urinary tract symptoms: Secondary | ICD-10-CM | POA: Diagnosis not present

## 2024-04-08 DIAGNOSIS — E568 Deficiency of other vitamins: Secondary | ICD-10-CM | POA: Diagnosis not present

## 2024-04-08 DIAGNOSIS — E785 Hyperlipidemia, unspecified: Secondary | ICD-10-CM | POA: Diagnosis not present

## 2024-04-09 DIAGNOSIS — N183 Chronic kidney disease, stage 3 unspecified: Secondary | ICD-10-CM | POA: Diagnosis not present

## 2024-04-09 DIAGNOSIS — E43 Unspecified severe protein-calorie malnutrition: Secondary | ICD-10-CM | POA: Diagnosis not present

## 2024-04-09 DIAGNOSIS — E568 Deficiency of other vitamins: Secondary | ICD-10-CM | POA: Diagnosis not present

## 2024-04-09 DIAGNOSIS — J984 Other disorders of lung: Secondary | ICD-10-CM | POA: Diagnosis not present

## 2024-04-09 DIAGNOSIS — E119 Type 2 diabetes mellitus without complications: Secondary | ICD-10-CM | POA: Diagnosis not present

## 2024-04-09 DIAGNOSIS — I129 Hypertensive chronic kidney disease with stage 1 through stage 4 chronic kidney disease, or unspecified chronic kidney disease: Secondary | ICD-10-CM | POA: Diagnosis not present

## 2024-04-09 DIAGNOSIS — N138 Other obstructive and reflux uropathy: Secondary | ICD-10-CM | POA: Diagnosis not present

## 2024-04-09 DIAGNOSIS — N401 Enlarged prostate with lower urinary tract symptoms: Secondary | ICD-10-CM | POA: Diagnosis not present

## 2024-04-09 DIAGNOSIS — E785 Hyperlipidemia, unspecified: Secondary | ICD-10-CM | POA: Diagnosis not present

## 2024-04-10 DIAGNOSIS — E568 Deficiency of other vitamins: Secondary | ICD-10-CM | POA: Diagnosis not present

## 2024-04-10 DIAGNOSIS — N183 Chronic kidney disease, stage 3 unspecified: Secondary | ICD-10-CM | POA: Diagnosis not present

## 2024-04-10 DIAGNOSIS — E43 Unspecified severe protein-calorie malnutrition: Secondary | ICD-10-CM | POA: Diagnosis not present

## 2024-04-10 DIAGNOSIS — E785 Hyperlipidemia, unspecified: Secondary | ICD-10-CM | POA: Diagnosis not present

## 2024-04-10 DIAGNOSIS — N138 Other obstructive and reflux uropathy: Secondary | ICD-10-CM | POA: Diagnosis not present

## 2024-04-10 DIAGNOSIS — E119 Type 2 diabetes mellitus without complications: Secondary | ICD-10-CM | POA: Diagnosis not present

## 2024-04-10 DIAGNOSIS — I10 Essential (primary) hypertension: Secondary | ICD-10-CM | POA: Diagnosis not present

## 2024-04-10 DIAGNOSIS — N401 Enlarged prostate with lower urinary tract symptoms: Secondary | ICD-10-CM | POA: Diagnosis not present

## 2024-04-10 DIAGNOSIS — J984 Other disorders of lung: Secondary | ICD-10-CM | POA: Diagnosis not present

## 2024-04-14 DIAGNOSIS — N183 Chronic kidney disease, stage 3 unspecified: Secondary | ICD-10-CM | POA: Diagnosis not present

## 2024-04-14 DIAGNOSIS — J984 Other disorders of lung: Secondary | ICD-10-CM | POA: Diagnosis not present

## 2024-04-14 DIAGNOSIS — E119 Type 2 diabetes mellitus without complications: Secondary | ICD-10-CM | POA: Diagnosis not present

## 2024-04-14 DIAGNOSIS — N138 Other obstructive and reflux uropathy: Secondary | ICD-10-CM | POA: Diagnosis not present

## 2024-04-14 DIAGNOSIS — E43 Unspecified severe protein-calorie malnutrition: Secondary | ICD-10-CM | POA: Diagnosis not present

## 2024-04-14 DIAGNOSIS — E568 Deficiency of other vitamins: Secondary | ICD-10-CM | POA: Diagnosis not present

## 2024-04-14 DIAGNOSIS — I129 Hypertensive chronic kidney disease with stage 1 through stage 4 chronic kidney disease, or unspecified chronic kidney disease: Secondary | ICD-10-CM | POA: Diagnosis not present

## 2024-04-14 DIAGNOSIS — E785 Hyperlipidemia, unspecified: Secondary | ICD-10-CM | POA: Diagnosis not present

## 2024-04-14 DIAGNOSIS — N401 Enlarged prostate with lower urinary tract symptoms: Secondary | ICD-10-CM | POA: Diagnosis not present

## 2024-04-15 DIAGNOSIS — N138 Other obstructive and reflux uropathy: Secondary | ICD-10-CM | POA: Diagnosis not present

## 2024-04-15 DIAGNOSIS — J984 Other disorders of lung: Secondary | ICD-10-CM | POA: Diagnosis not present

## 2024-04-15 DIAGNOSIS — E43 Unspecified severe protein-calorie malnutrition: Secondary | ICD-10-CM | POA: Diagnosis not present

## 2024-04-15 DIAGNOSIS — E568 Deficiency of other vitamins: Secondary | ICD-10-CM | POA: Diagnosis not present

## 2024-04-15 DIAGNOSIS — E785 Hyperlipidemia, unspecified: Secondary | ICD-10-CM | POA: Diagnosis not present

## 2024-04-15 DIAGNOSIS — I129 Hypertensive chronic kidney disease with stage 1 through stage 4 chronic kidney disease, or unspecified chronic kidney disease: Secondary | ICD-10-CM | POA: Diagnosis not present

## 2024-04-15 DIAGNOSIS — N183 Chronic kidney disease, stage 3 unspecified: Secondary | ICD-10-CM | POA: Diagnosis not present

## 2024-04-15 DIAGNOSIS — E119 Type 2 diabetes mellitus without complications: Secondary | ICD-10-CM | POA: Diagnosis not present

## 2024-04-15 DIAGNOSIS — N401 Enlarged prostate with lower urinary tract symptoms: Secondary | ICD-10-CM | POA: Diagnosis not present

## 2024-04-16 ENCOUNTER — Ambulatory Visit: Admitting: Orthopedic Surgery

## 2024-04-25 DIAGNOSIS — E43 Unspecified severe protein-calorie malnutrition: Secondary | ICD-10-CM | POA: Diagnosis not present

## 2024-04-25 DIAGNOSIS — M6281 Muscle weakness (generalized): Secondary | ICD-10-CM | POA: Diagnosis not present

## 2024-04-25 DIAGNOSIS — N183 Chronic kidney disease, stage 3 unspecified: Secondary | ICD-10-CM | POA: Diagnosis not present

## 2024-04-25 DIAGNOSIS — G9340 Encephalopathy, unspecified: Secondary | ICD-10-CM | POA: Diagnosis not present

## 2024-04-25 DIAGNOSIS — M158 Other polyosteoarthritis: Secondary | ICD-10-CM | POA: Diagnosis not present

## 2024-04-25 DIAGNOSIS — E119 Type 2 diabetes mellitus without complications: Secondary | ICD-10-CM | POA: Diagnosis not present

## 2024-04-25 DIAGNOSIS — N138 Other obstructive and reflux uropathy: Secondary | ICD-10-CM | POA: Diagnosis not present

## 2024-04-25 DIAGNOSIS — G9341 Metabolic encephalopathy: Secondary | ICD-10-CM | POA: Diagnosis not present

## 2024-04-25 DIAGNOSIS — J984 Other disorders of lung: Secondary | ICD-10-CM | POA: Diagnosis not present

## 2024-04-27 DIAGNOSIS — N138 Other obstructive and reflux uropathy: Secondary | ICD-10-CM | POA: Diagnosis not present

## 2024-04-27 DIAGNOSIS — E568 Deficiency of other vitamins: Secondary | ICD-10-CM | POA: Diagnosis not present

## 2024-04-27 DIAGNOSIS — I10 Essential (primary) hypertension: Secondary | ICD-10-CM | POA: Diagnosis not present

## 2024-04-27 DIAGNOSIS — E43 Unspecified severe protein-calorie malnutrition: Secondary | ICD-10-CM | POA: Diagnosis not present

## 2024-04-27 DIAGNOSIS — E785 Hyperlipidemia, unspecified: Secondary | ICD-10-CM | POA: Diagnosis not present

## 2024-04-27 DIAGNOSIS — E119 Type 2 diabetes mellitus without complications: Secondary | ICD-10-CM | POA: Diagnosis not present

## 2024-04-27 DIAGNOSIS — N183 Chronic kidney disease, stage 3 unspecified: Secondary | ICD-10-CM | POA: Diagnosis not present

## 2024-04-27 DIAGNOSIS — N401 Enlarged prostate with lower urinary tract symptoms: Secondary | ICD-10-CM | POA: Diagnosis not present

## 2024-04-27 DIAGNOSIS — J984 Other disorders of lung: Secondary | ICD-10-CM | POA: Diagnosis not present

## 2024-04-28 DIAGNOSIS — N138 Other obstructive and reflux uropathy: Secondary | ICD-10-CM | POA: Diagnosis not present

## 2024-04-28 DIAGNOSIS — J984 Other disorders of lung: Secondary | ICD-10-CM | POA: Diagnosis not present

## 2024-04-28 DIAGNOSIS — E785 Hyperlipidemia, unspecified: Secondary | ICD-10-CM | POA: Diagnosis not present

## 2024-04-28 DIAGNOSIS — I129 Hypertensive chronic kidney disease with stage 1 through stage 4 chronic kidney disease, or unspecified chronic kidney disease: Secondary | ICD-10-CM | POA: Diagnosis not present

## 2024-04-28 DIAGNOSIS — N401 Enlarged prostate with lower urinary tract symptoms: Secondary | ICD-10-CM | POA: Diagnosis not present

## 2024-04-28 DIAGNOSIS — E568 Deficiency of other vitamins: Secondary | ICD-10-CM | POA: Diagnosis not present

## 2024-04-28 DIAGNOSIS — N183 Chronic kidney disease, stage 3 unspecified: Secondary | ICD-10-CM | POA: Diagnosis not present

## 2024-04-28 DIAGNOSIS — E43 Unspecified severe protein-calorie malnutrition: Secondary | ICD-10-CM | POA: Diagnosis not present

## 2024-04-28 DIAGNOSIS — E119 Type 2 diabetes mellitus without complications: Secondary | ICD-10-CM | POA: Diagnosis not present

## 2024-05-07 ENCOUNTER — Ambulatory Visit: Admitting: Orthopedic Surgery

## 2024-05-13 DIAGNOSIS — G9340 Encephalopathy, unspecified: Secondary | ICD-10-CM | POA: Diagnosis not present

## 2024-05-13 DIAGNOSIS — N138 Other obstructive and reflux uropathy: Secondary | ICD-10-CM | POA: Diagnosis not present

## 2024-05-13 DIAGNOSIS — E43 Unspecified severe protein-calorie malnutrition: Secondary | ICD-10-CM | POA: Diagnosis not present

## 2024-05-13 DIAGNOSIS — N183 Chronic kidney disease, stage 3 unspecified: Secondary | ICD-10-CM | POA: Diagnosis not present

## 2024-05-13 DIAGNOSIS — M6281 Muscle weakness (generalized): Secondary | ICD-10-CM | POA: Diagnosis not present

## 2024-05-13 DIAGNOSIS — M158 Other polyosteoarthritis: Secondary | ICD-10-CM | POA: Diagnosis not present

## 2024-05-13 DIAGNOSIS — J984 Other disorders of lung: Secondary | ICD-10-CM | POA: Diagnosis not present

## 2024-05-13 DIAGNOSIS — E119 Type 2 diabetes mellitus without complications: Secondary | ICD-10-CM | POA: Diagnosis not present

## 2024-05-13 DIAGNOSIS — G9341 Metabolic encephalopathy: Secondary | ICD-10-CM | POA: Diagnosis not present

## 2024-05-14 DIAGNOSIS — F411 Generalized anxiety disorder: Secondary | ICD-10-CM | POA: Diagnosis not present

## 2024-05-16 DIAGNOSIS — Z9181 History of falling: Secondary | ICD-10-CM | POA: Diagnosis not present

## 2024-05-16 DIAGNOSIS — F03C11 Unspecified dementia, severe, with agitation: Secondary | ICD-10-CM | POA: Diagnosis not present

## 2024-05-16 DIAGNOSIS — N3946 Mixed incontinence: Secondary | ICD-10-CM | POA: Diagnosis not present

## 2024-05-16 DIAGNOSIS — Z743 Need for continuous supervision: Secondary | ICD-10-CM | POA: Diagnosis not present

## 2024-05-16 DIAGNOSIS — R41841 Cognitive communication deficit: Secondary | ICD-10-CM | POA: Diagnosis not present

## 2024-05-16 DIAGNOSIS — I119 Hypertensive heart disease without heart failure: Secondary | ICD-10-CM | POA: Diagnosis not present

## 2024-05-16 DIAGNOSIS — R1311 Dysphagia, oral phase: Secondary | ICD-10-CM | POA: Diagnosis not present

## 2024-05-16 DIAGNOSIS — N401 Enlarged prostate with lower urinary tract symptoms: Secondary | ICD-10-CM | POA: Diagnosis not present

## 2024-05-16 DIAGNOSIS — R7303 Prediabetes: Secondary | ICD-10-CM | POA: Diagnosis not present

## 2024-05-21 DIAGNOSIS — J984 Other disorders of lung: Secondary | ICD-10-CM | POA: Diagnosis not present

## 2024-05-21 DIAGNOSIS — N183 Chronic kidney disease, stage 3 unspecified: Secondary | ICD-10-CM | POA: Diagnosis not present

## 2024-05-21 DIAGNOSIS — N138 Other obstructive and reflux uropathy: Secondary | ICD-10-CM | POA: Diagnosis not present

## 2024-05-21 DIAGNOSIS — E119 Type 2 diabetes mellitus without complications: Secondary | ICD-10-CM | POA: Diagnosis not present

## 2024-05-21 DIAGNOSIS — M6281 Muscle weakness (generalized): Secondary | ICD-10-CM | POA: Diagnosis not present

## 2024-05-21 DIAGNOSIS — E43 Unspecified severe protein-calorie malnutrition: Secondary | ICD-10-CM | POA: Diagnosis not present

## 2024-05-21 DIAGNOSIS — G9340 Encephalopathy, unspecified: Secondary | ICD-10-CM | POA: Diagnosis not present

## 2024-05-21 DIAGNOSIS — G9341 Metabolic encephalopathy: Secondary | ICD-10-CM | POA: Diagnosis not present

## 2024-05-21 DIAGNOSIS — M158 Other polyosteoarthritis: Secondary | ICD-10-CM | POA: Diagnosis not present

## 2024-05-22 DIAGNOSIS — N183 Chronic kidney disease, stage 3 unspecified: Secondary | ICD-10-CM | POA: Diagnosis not present

## 2024-05-22 DIAGNOSIS — F411 Generalized anxiety disorder: Secondary | ICD-10-CM | POA: Diagnosis not present

## 2024-05-22 DIAGNOSIS — E119 Type 2 diabetes mellitus without complications: Secondary | ICD-10-CM | POA: Diagnosis not present

## 2024-05-22 DIAGNOSIS — E43 Unspecified severe protein-calorie malnutrition: Secondary | ICD-10-CM | POA: Diagnosis not present

## 2024-05-22 DIAGNOSIS — G9341 Metabolic encephalopathy: Secondary | ICD-10-CM | POA: Diagnosis not present

## 2024-05-22 DIAGNOSIS — J984 Other disorders of lung: Secondary | ICD-10-CM | POA: Diagnosis not present

## 2024-05-22 DIAGNOSIS — M158 Other polyosteoarthritis: Secondary | ICD-10-CM | POA: Diagnosis not present

## 2024-05-22 DIAGNOSIS — N138 Other obstructive and reflux uropathy: Secondary | ICD-10-CM | POA: Diagnosis not present

## 2024-05-22 DIAGNOSIS — M6281 Muscle weakness (generalized): Secondary | ICD-10-CM | POA: Diagnosis not present

## 2024-05-22 DIAGNOSIS — G9340 Encephalopathy, unspecified: Secondary | ICD-10-CM | POA: Diagnosis not present

## 2024-05-23 ENCOUNTER — Ambulatory Visit: Admitting: Gastroenterology

## 2024-05-23 DIAGNOSIS — M6281 Muscle weakness (generalized): Secondary | ICD-10-CM | POA: Diagnosis not present

## 2024-05-23 DIAGNOSIS — G9340 Encephalopathy, unspecified: Secondary | ICD-10-CM | POA: Diagnosis not present

## 2024-05-23 DIAGNOSIS — M158 Other polyosteoarthritis: Secondary | ICD-10-CM | POA: Diagnosis not present

## 2024-05-23 DIAGNOSIS — E43 Unspecified severe protein-calorie malnutrition: Secondary | ICD-10-CM | POA: Diagnosis not present

## 2024-05-23 DIAGNOSIS — N183 Chronic kidney disease, stage 3 unspecified: Secondary | ICD-10-CM | POA: Diagnosis not present

## 2024-05-23 DIAGNOSIS — G9341 Metabolic encephalopathy: Secondary | ICD-10-CM | POA: Diagnosis not present

## 2024-05-23 DIAGNOSIS — N138 Other obstructive and reflux uropathy: Secondary | ICD-10-CM | POA: Diagnosis not present

## 2024-05-23 DIAGNOSIS — J984 Other disorders of lung: Secondary | ICD-10-CM | POA: Diagnosis not present

## 2024-05-23 DIAGNOSIS — E119 Type 2 diabetes mellitus without complications: Secondary | ICD-10-CM | POA: Diagnosis not present

## 2024-05-26 DIAGNOSIS — N138 Other obstructive and reflux uropathy: Secondary | ICD-10-CM | POA: Diagnosis not present

## 2024-05-26 DIAGNOSIS — G9341 Metabolic encephalopathy: Secondary | ICD-10-CM | POA: Diagnosis not present

## 2024-05-26 DIAGNOSIS — M6281 Muscle weakness (generalized): Secondary | ICD-10-CM | POA: Diagnosis not present

## 2024-05-26 DIAGNOSIS — M158 Other polyosteoarthritis: Secondary | ICD-10-CM | POA: Diagnosis not present

## 2024-05-26 DIAGNOSIS — G9340 Encephalopathy, unspecified: Secondary | ICD-10-CM | POA: Diagnosis not present

## 2024-05-26 DIAGNOSIS — E43 Unspecified severe protein-calorie malnutrition: Secondary | ICD-10-CM | POA: Diagnosis not present

## 2024-05-26 DIAGNOSIS — E119 Type 2 diabetes mellitus without complications: Secondary | ICD-10-CM | POA: Diagnosis not present

## 2024-05-26 DIAGNOSIS — N183 Chronic kidney disease, stage 3 unspecified: Secondary | ICD-10-CM | POA: Diagnosis not present

## 2024-05-26 DIAGNOSIS — J984 Other disorders of lung: Secondary | ICD-10-CM | POA: Diagnosis not present

## 2024-05-27 DIAGNOSIS — G9341 Metabolic encephalopathy: Secondary | ICD-10-CM | POA: Diagnosis not present

## 2024-05-27 DIAGNOSIS — N138 Other obstructive and reflux uropathy: Secondary | ICD-10-CM | POA: Diagnosis not present

## 2024-05-27 DIAGNOSIS — J984 Other disorders of lung: Secondary | ICD-10-CM | POA: Diagnosis not present

## 2024-05-27 DIAGNOSIS — E119 Type 2 diabetes mellitus without complications: Secondary | ICD-10-CM | POA: Diagnosis not present

## 2024-05-27 DIAGNOSIS — M158 Other polyosteoarthritis: Secondary | ICD-10-CM | POA: Diagnosis not present

## 2024-05-27 DIAGNOSIS — E43 Unspecified severe protein-calorie malnutrition: Secondary | ICD-10-CM | POA: Diagnosis not present

## 2024-05-27 DIAGNOSIS — M6281 Muscle weakness (generalized): Secondary | ICD-10-CM | POA: Diagnosis not present

## 2024-05-27 DIAGNOSIS — N183 Chronic kidney disease, stage 3 unspecified: Secondary | ICD-10-CM | POA: Diagnosis not present

## 2024-05-27 DIAGNOSIS — G9340 Encephalopathy, unspecified: Secondary | ICD-10-CM | POA: Diagnosis not present

## 2024-05-28 ENCOUNTER — Other Ambulatory Visit (INDEPENDENT_AMBULATORY_CARE_PROVIDER_SITE_OTHER): Payer: Self-pay

## 2024-05-28 ENCOUNTER — Ambulatory Visit: Admitting: Orthopedic Surgery

## 2024-05-28 DIAGNOSIS — S62324A Displaced fracture of shaft of fourth metacarpal bone, right hand, initial encounter for closed fracture: Secondary | ICD-10-CM

## 2024-05-28 DIAGNOSIS — S62356A Nondisplaced fracture of shaft of fifth metacarpal bone, right hand, initial encounter for closed fracture: Secondary | ICD-10-CM

## 2024-05-28 DIAGNOSIS — R0602 Shortness of breath: Secondary | ICD-10-CM | POA: Diagnosis not present

## 2024-05-28 DIAGNOSIS — E43 Unspecified severe protein-calorie malnutrition: Secondary | ICD-10-CM | POA: Diagnosis not present

## 2024-05-28 DIAGNOSIS — M79641 Pain in right hand: Secondary | ICD-10-CM

## 2024-05-28 DIAGNOSIS — G9340 Encephalopathy, unspecified: Secondary | ICD-10-CM | POA: Diagnosis not present

## 2024-05-28 DIAGNOSIS — M6281 Muscle weakness (generalized): Secondary | ICD-10-CM | POA: Diagnosis not present

## 2024-05-28 DIAGNOSIS — J984 Other disorders of lung: Secondary | ICD-10-CM | POA: Diagnosis not present

## 2024-05-28 DIAGNOSIS — G9341 Metabolic encephalopathy: Secondary | ICD-10-CM | POA: Diagnosis not present

## 2024-05-28 DIAGNOSIS — M158 Other polyosteoarthritis: Secondary | ICD-10-CM | POA: Diagnosis not present

## 2024-05-28 DIAGNOSIS — E119 Type 2 diabetes mellitus without complications: Secondary | ICD-10-CM | POA: Diagnosis not present

## 2024-05-28 DIAGNOSIS — N138 Other obstructive and reflux uropathy: Secondary | ICD-10-CM | POA: Diagnosis not present

## 2024-05-28 DIAGNOSIS — N183 Chronic kidney disease, stage 3 unspecified: Secondary | ICD-10-CM | POA: Diagnosis not present

## 2024-05-28 NOTE — Progress Notes (Signed)
 Chase Mccarthy - 83 y.o. male MRN [id number]  Date of birth: 28-Apr-1941  Office Visit Note: Visit Date: 05/28/2024 PCP: Chase Senior, FNP Referred by: Chase Senior, FNP  Subjective: No chief complaint on file.  HPI: Chase Mccarthy is a 83 y.o. male who returns today for follow-up of right ring and small finger metacarpal shaft fractures.  Pertinent ROS were reviewed with the patient and found to be negative unless otherwise specified above in HPI.    Assessment & Plan: Visit Diagnoses:  1. Closed displaced fracture of shaft of fourth metacarpal bone of right hand, initial encounter   2. Closed nondisplaced fracture of shaft of fifth metacarpal bone of right hand, initial encounter   3. Pain in right hand      Plan: Repeat x-rays obtained today show appropriate interval healing of the ring and small finger metacarpal shaft fractures with appropriate callus formation.  This correlates with his examination today which shows minimal tenderness in this region, appropriate range of motion without significant dysfunction.  At this juncture, activity as tolerated with the right hand, return as needed.   Follow-up: No follow-ups on file.   Meds & Orders: No orders of the defined types were placed in this encounter.   Orders Placed This Encounter  Procedures   XR Hand Complete Right     Procedures: No procedures performed      Clinical History: No specialty comments available.  He reports that he has been smoking cigars. He has never used smokeless tobacco.  Recent Labs    12/02/23 1542  HGBA1C 5.6    Objective:   Vital Signs: There were no vitals taken for this visit.  Physical Exam  Gen: Well-appearing, in no acute distress; non-toxic CV: Regular Rate. Well-perfused. Warm.  Resp: Breathing unlabored on room air; no wheezing. Psych: Fluid speech in conversation; appropriate affect; normal thought process  Ortho Exam Right hand: - Minimal swelling over  the dorsal aspect of the hand, minimal tenderness over the ring and small finger metacarpal region -Able to perform composite fist, no significant signs of digital overlap, no rotational abnormalities notable to the ring and small finger - Sensation intact distally median/radial/ulnar, AIN/PIN/interosseous intact - Hand remains warm well-perfused  Imaging: XR Hand Complete Right Result Date: 05/28/2024 X-rays of the right hand demonstrate appropriate interval healing of the ring and small finger metacarpal shaft fractures, callus formation is seen on multiple views     Past Medical/Family/Surgical/Social History: Medications & Allergies reviewed per EMR, new medications updated. Patient Active Problem List   Diagnosis Date Noted   Acute encephalopathy 11/30/2023   CKD (chronic kidney disease) stage 3, GFR 30-59 ml/min (HCC) 11/30/2023   Protein-calorie malnutrition, severe 01/19/2023   AKI (acute kidney injury) (HCC) 01/18/2023   Hypertension    Diabetes mellitus (HCC)    Restrictive lung disease    Onychomycosis    Essential hypertension 01/13/2011    Class: Chronic   Diabetes mellitus type 2 in nonobese (HCC) 01/13/2011   Vitamin D deficiency 08/17/2009    Class: History of   Benign prostatic hyperplasia with urinary obstruction 01/13/2004    Class: Chronic   Hyperlipidemia with target LDL less than 100 10/28/1999    Class: History of   Tobacco abuse 10/15/1992    Class: Chronic   Past Medical History:  Diagnosis Date   BPH (benign prostatic hyperplasia)    mild   Dermatitis    axillary   Diabetes mellitus (HCC)  Dyslipidemia    Hypertension    stable   Onychomycosis    Restrictive lung disease    Tobacco use    Tricuspid valve regurgitation    Family History  Problem Relation Age of Onset   Heart failure Mother    Cancer Father    Past Surgical History:  Procedure Laterality Date   CARDIAC CATHETERIZATION  11/20/2003   normal coronaries   NM MYOCAR PERF  WALL MOTION  03/27/2012   normal   US  ECHOCARDIOGRAPHY  09/23/2009   mild-mod LVH,EF =>55%,mild MVP,trace MR,mild to mod TR, AOV mildly sclerotic.   Social History   Occupational History   Not on file  Tobacco Use   Smoking status: Every Day    Types: Cigars   Smokeless tobacco: Never   Tobacco comments:    3 cigars daily  Substance and Sexual Activity   Alcohol use: No   Drug use: No   Sexual activity: Not Currently    Chase Mccarthy) Arlinda, M.D. [facility], Hand Surgery

## 2024-05-29 DIAGNOSIS — N183 Chronic kidney disease, stage 3 unspecified: Secondary | ICD-10-CM | POA: Diagnosis not present

## 2024-05-29 DIAGNOSIS — E119 Type 2 diabetes mellitus without complications: Secondary | ICD-10-CM | POA: Diagnosis not present

## 2024-05-29 DIAGNOSIS — M6281 Muscle weakness (generalized): Secondary | ICD-10-CM | POA: Diagnosis not present

## 2024-05-29 DIAGNOSIS — N138 Other obstructive and reflux uropathy: Secondary | ICD-10-CM | POA: Diagnosis not present

## 2024-05-29 DIAGNOSIS — G9340 Encephalopathy, unspecified: Secondary | ICD-10-CM | POA: Diagnosis not present

## 2024-05-29 DIAGNOSIS — G9341 Metabolic encephalopathy: Secondary | ICD-10-CM | POA: Diagnosis not present

## 2024-05-29 DIAGNOSIS — E43 Unspecified severe protein-calorie malnutrition: Secondary | ICD-10-CM | POA: Diagnosis not present

## 2024-05-29 DIAGNOSIS — M158 Other polyosteoarthritis: Secondary | ICD-10-CM | POA: Diagnosis not present

## 2024-05-29 DIAGNOSIS — J984 Other disorders of lung: Secondary | ICD-10-CM | POA: Diagnosis not present

## 2024-05-30 DIAGNOSIS — R0602 Shortness of breath: Secondary | ICD-10-CM | POA: Diagnosis not present

## 2024-05-30 DIAGNOSIS — J811 Chronic pulmonary edema: Secondary | ICD-10-CM | POA: Diagnosis not present

## 2024-06-02 DIAGNOSIS — G9340 Encephalopathy, unspecified: Secondary | ICD-10-CM | POA: Diagnosis not present

## 2024-06-02 DIAGNOSIS — E119 Type 2 diabetes mellitus without complications: Secondary | ICD-10-CM | POA: Diagnosis not present

## 2024-06-02 DIAGNOSIS — N138 Other obstructive and reflux uropathy: Secondary | ICD-10-CM | POA: Diagnosis not present

## 2024-06-02 DIAGNOSIS — J984 Other disorders of lung: Secondary | ICD-10-CM | POA: Diagnosis not present

## 2024-06-02 DIAGNOSIS — N183 Chronic kidney disease, stage 3 unspecified: Secondary | ICD-10-CM | POA: Diagnosis not present

## 2024-06-02 DIAGNOSIS — E43 Unspecified severe protein-calorie malnutrition: Secondary | ICD-10-CM | POA: Diagnosis not present

## 2024-06-02 DIAGNOSIS — G9341 Metabolic encephalopathy: Secondary | ICD-10-CM | POA: Diagnosis not present

## 2024-06-02 DIAGNOSIS — M158 Other polyosteoarthritis: Secondary | ICD-10-CM | POA: Diagnosis not present

## 2024-06-02 DIAGNOSIS — M6281 Muscle weakness (generalized): Secondary | ICD-10-CM | POA: Diagnosis not present

## 2024-06-03 DIAGNOSIS — G9340 Encephalopathy, unspecified: Secondary | ICD-10-CM | POA: Diagnosis not present

## 2024-06-04 DIAGNOSIS — N183 Chronic kidney disease, stage 3 unspecified: Secondary | ICD-10-CM | POA: Diagnosis not present

## 2024-06-04 DIAGNOSIS — G9341 Metabolic encephalopathy: Secondary | ICD-10-CM | POA: Diagnosis not present

## 2024-06-04 DIAGNOSIS — N138 Other obstructive and reflux uropathy: Secondary | ICD-10-CM | POA: Diagnosis not present

## 2024-06-04 DIAGNOSIS — E119 Type 2 diabetes mellitus without complications: Secondary | ICD-10-CM | POA: Diagnosis not present

## 2024-06-04 DIAGNOSIS — E43 Unspecified severe protein-calorie malnutrition: Secondary | ICD-10-CM | POA: Diagnosis not present

## 2024-06-04 DIAGNOSIS — M158 Other polyosteoarthritis: Secondary | ICD-10-CM | POA: Diagnosis not present

## 2024-06-04 DIAGNOSIS — J984 Other disorders of lung: Secondary | ICD-10-CM | POA: Diagnosis not present

## 2024-06-04 DIAGNOSIS — M6281 Muscle weakness (generalized): Secondary | ICD-10-CM | POA: Diagnosis not present

## 2024-06-04 DIAGNOSIS — G9340 Encephalopathy, unspecified: Secondary | ICD-10-CM | POA: Diagnosis not present

## 2024-06-05 DIAGNOSIS — N138 Other obstructive and reflux uropathy: Secondary | ICD-10-CM | POA: Diagnosis not present

## 2024-06-05 DIAGNOSIS — M6281 Muscle weakness (generalized): Secondary | ICD-10-CM | POA: Diagnosis not present

## 2024-06-05 DIAGNOSIS — G9341 Metabolic encephalopathy: Secondary | ICD-10-CM | POA: Diagnosis not present

## 2024-06-05 DIAGNOSIS — E43 Unspecified severe protein-calorie malnutrition: Secondary | ICD-10-CM | POA: Diagnosis not present

## 2024-06-05 DIAGNOSIS — G9340 Encephalopathy, unspecified: Secondary | ICD-10-CM | POA: Diagnosis not present

## 2024-06-05 DIAGNOSIS — N183 Chronic kidney disease, stage 3 unspecified: Secondary | ICD-10-CM | POA: Diagnosis not present

## 2024-06-05 DIAGNOSIS — J984 Other disorders of lung: Secondary | ICD-10-CM | POA: Diagnosis not present

## 2024-06-05 DIAGNOSIS — M158 Other polyosteoarthritis: Secondary | ICD-10-CM | POA: Diagnosis not present

## 2024-06-05 DIAGNOSIS — E119 Type 2 diabetes mellitus without complications: Secondary | ICD-10-CM | POA: Diagnosis not present

## 2024-06-10 DIAGNOSIS — M158 Other polyosteoarthritis: Secondary | ICD-10-CM | POA: Diagnosis not present

## 2024-06-10 DIAGNOSIS — G9341 Metabolic encephalopathy: Secondary | ICD-10-CM | POA: Diagnosis not present

## 2024-06-10 DIAGNOSIS — M6281 Muscle weakness (generalized): Secondary | ICD-10-CM | POA: Diagnosis not present

## 2024-06-10 DIAGNOSIS — E119 Type 2 diabetes mellitus without complications: Secondary | ICD-10-CM | POA: Diagnosis not present

## 2024-06-10 DIAGNOSIS — G9340 Encephalopathy, unspecified: Secondary | ICD-10-CM | POA: Diagnosis not present

## 2024-06-10 DIAGNOSIS — E43 Unspecified severe protein-calorie malnutrition: Secondary | ICD-10-CM | POA: Diagnosis not present

## 2024-06-10 DIAGNOSIS — N138 Other obstructive and reflux uropathy: Secondary | ICD-10-CM | POA: Diagnosis not present

## 2024-06-10 DIAGNOSIS — N183 Chronic kidney disease, stage 3 unspecified: Secondary | ICD-10-CM | POA: Diagnosis not present

## 2024-06-10 DIAGNOSIS — J984 Other disorders of lung: Secondary | ICD-10-CM | POA: Diagnosis not present

## 2024-06-12 DIAGNOSIS — G9341 Metabolic encephalopathy: Secondary | ICD-10-CM | POA: Diagnosis not present

## 2024-06-12 DIAGNOSIS — E119 Type 2 diabetes mellitus without complications: Secondary | ICD-10-CM | POA: Diagnosis not present

## 2024-06-12 DIAGNOSIS — M6281 Muscle weakness (generalized): Secondary | ICD-10-CM | POA: Diagnosis not present

## 2024-06-12 DIAGNOSIS — G9340 Encephalopathy, unspecified: Secondary | ICD-10-CM | POA: Diagnosis not present

## 2024-06-12 DIAGNOSIS — N183 Chronic kidney disease, stage 3 unspecified: Secondary | ICD-10-CM | POA: Diagnosis not present

## 2024-06-12 DIAGNOSIS — M158 Other polyosteoarthritis: Secondary | ICD-10-CM | POA: Diagnosis not present

## 2024-06-12 DIAGNOSIS — J984 Other disorders of lung: Secondary | ICD-10-CM | POA: Diagnosis not present

## 2024-06-12 DIAGNOSIS — N138 Other obstructive and reflux uropathy: Secondary | ICD-10-CM | POA: Diagnosis not present

## 2024-06-12 DIAGNOSIS — E43 Unspecified severe protein-calorie malnutrition: Secondary | ICD-10-CM | POA: Diagnosis not present

## 2024-06-18 DIAGNOSIS — R1311 Dysphagia, oral phase: Secondary | ICD-10-CM | POA: Diagnosis not present

## 2024-06-18 DIAGNOSIS — R41841 Cognitive communication deficit: Secondary | ICD-10-CM | POA: Diagnosis not present

## 2024-06-18 DIAGNOSIS — G9340 Encephalopathy, unspecified: Secondary | ICD-10-CM | POA: Diagnosis not present

## 2024-06-18 DIAGNOSIS — M6281 Muscle weakness (generalized): Secondary | ICD-10-CM | POA: Diagnosis not present

## 2024-06-19 DIAGNOSIS — G9340 Encephalopathy, unspecified: Secondary | ICD-10-CM | POA: Diagnosis not present

## 2024-06-19 DIAGNOSIS — M6281 Muscle weakness (generalized): Secondary | ICD-10-CM | POA: Diagnosis not present

## 2024-06-19 DIAGNOSIS — R1311 Dysphagia, oral phase: Secondary | ICD-10-CM | POA: Diagnosis not present

## 2024-06-19 DIAGNOSIS — R41841 Cognitive communication deficit: Secondary | ICD-10-CM | POA: Diagnosis not present

## 2024-06-20 DIAGNOSIS — G9341 Metabolic encephalopathy: Secondary | ICD-10-CM | POA: Diagnosis not present

## 2024-06-20 DIAGNOSIS — E119 Type 2 diabetes mellitus without complications: Secondary | ICD-10-CM | POA: Diagnosis not present

## 2024-06-20 DIAGNOSIS — M158 Other polyosteoarthritis: Secondary | ICD-10-CM | POA: Diagnosis not present

## 2024-06-20 DIAGNOSIS — M6281 Muscle weakness (generalized): Secondary | ICD-10-CM | POA: Diagnosis not present

## 2024-06-20 DIAGNOSIS — N183 Chronic kidney disease, stage 3 unspecified: Secondary | ICD-10-CM | POA: Diagnosis not present

## 2024-06-20 DIAGNOSIS — N138 Other obstructive and reflux uropathy: Secondary | ICD-10-CM | POA: Diagnosis not present

## 2024-06-20 DIAGNOSIS — R41841 Cognitive communication deficit: Secondary | ICD-10-CM | POA: Diagnosis not present

## 2024-06-20 DIAGNOSIS — E43 Unspecified severe protein-calorie malnutrition: Secondary | ICD-10-CM | POA: Diagnosis not present

## 2024-06-20 DIAGNOSIS — J984 Other disorders of lung: Secondary | ICD-10-CM | POA: Diagnosis not present

## 2024-06-20 DIAGNOSIS — G9340 Encephalopathy, unspecified: Secondary | ICD-10-CM | POA: Diagnosis not present

## 2024-06-20 DIAGNOSIS — R1311 Dysphagia, oral phase: Secondary | ICD-10-CM | POA: Diagnosis not present

## 2024-06-21 DIAGNOSIS — M158 Other polyosteoarthritis: Secondary | ICD-10-CM | POA: Diagnosis not present

## 2024-06-21 DIAGNOSIS — J984 Other disorders of lung: Secondary | ICD-10-CM | POA: Diagnosis not present

## 2024-06-21 DIAGNOSIS — E119 Type 2 diabetes mellitus without complications: Secondary | ICD-10-CM | POA: Diagnosis not present

## 2024-06-21 DIAGNOSIS — E43 Unspecified severe protein-calorie malnutrition: Secondary | ICD-10-CM | POA: Diagnosis not present

## 2024-06-21 DIAGNOSIS — M6281 Muscle weakness (generalized): Secondary | ICD-10-CM | POA: Diagnosis not present

## 2024-06-21 DIAGNOSIS — G9341 Metabolic encephalopathy: Secondary | ICD-10-CM | POA: Diagnosis not present

## 2024-06-21 DIAGNOSIS — N183 Chronic kidney disease, stage 3 unspecified: Secondary | ICD-10-CM | POA: Diagnosis not present

## 2024-06-21 DIAGNOSIS — G9340 Encephalopathy, unspecified: Secondary | ICD-10-CM | POA: Diagnosis not present

## 2024-06-21 DIAGNOSIS — N138 Other obstructive and reflux uropathy: Secondary | ICD-10-CM | POA: Diagnosis not present

## 2024-06-23 DIAGNOSIS — R41841 Cognitive communication deficit: Secondary | ICD-10-CM | POA: Diagnosis not present

## 2024-06-23 DIAGNOSIS — R1311 Dysphagia, oral phase: Secondary | ICD-10-CM | POA: Diagnosis not present

## 2024-06-23 DIAGNOSIS — M6281 Muscle weakness (generalized): Secondary | ICD-10-CM | POA: Diagnosis not present

## 2024-06-23 DIAGNOSIS — G9340 Encephalopathy, unspecified: Secondary | ICD-10-CM | POA: Diagnosis not present

## 2024-06-24 ENCOUNTER — Emergency Department (HOSPITAL_COMMUNITY)

## 2024-06-24 ENCOUNTER — Emergency Department (HOSPITAL_COMMUNITY)
Admission: EM | Admit: 2024-06-24 | Discharge: 2024-06-25 | Disposition: A | Discharge status: Skilled Nursing Facility | Attending: Emergency Medicine | Admitting: Emergency Medicine

## 2024-06-24 DIAGNOSIS — E119 Type 2 diabetes mellitus without complications: Secondary | ICD-10-CM | POA: Diagnosis not present

## 2024-06-24 DIAGNOSIS — Z7982 Long term (current) use of aspirin: Secondary | ICD-10-CM | POA: Insufficient documentation

## 2024-06-24 DIAGNOSIS — N189 Chronic kidney disease, unspecified: Secondary | ICD-10-CM | POA: Diagnosis not present

## 2024-06-24 DIAGNOSIS — Z8673 Personal history of transient ischemic attack (TIA), and cerebral infarction without residual deficits: Secondary | ICD-10-CM | POA: Insufficient documentation

## 2024-06-24 DIAGNOSIS — N183 Chronic kidney disease, stage 3 unspecified: Secondary | ICD-10-CM | POA: Diagnosis not present

## 2024-06-24 DIAGNOSIS — R2990 Unspecified symptoms and signs involving the nervous system: Secondary | ICD-10-CM

## 2024-06-24 DIAGNOSIS — E11649 Type 2 diabetes mellitus with hypoglycemia without coma: Secondary | ICD-10-CM | POA: Diagnosis not present

## 2024-06-24 DIAGNOSIS — F03C11 Unspecified dementia, severe, with agitation: Secondary | ICD-10-CM | POA: Diagnosis not present

## 2024-06-24 DIAGNOSIS — I10 Essential (primary) hypertension: Secondary | ICD-10-CM | POA: Diagnosis not present

## 2024-06-24 DIAGNOSIS — M6281 Muscle weakness (generalized): Secondary | ICD-10-CM | POA: Diagnosis not present

## 2024-06-24 DIAGNOSIS — I129 Hypertensive chronic kidney disease with stage 1 through stage 4 chronic kidney disease, or unspecified chronic kidney disease: Secondary | ICD-10-CM | POA: Diagnosis not present

## 2024-06-24 DIAGNOSIS — G9341 Metabolic encephalopathy: Secondary | ICD-10-CM | POA: Diagnosis not present

## 2024-06-24 DIAGNOSIS — G9340 Encephalopathy, unspecified: Secondary | ICD-10-CM | POA: Diagnosis not present

## 2024-06-24 DIAGNOSIS — N138 Other obstructive and reflux uropathy: Secondary | ICD-10-CM | POA: Diagnosis not present

## 2024-06-24 DIAGNOSIS — E1122 Type 2 diabetes mellitus with diabetic chronic kidney disease: Secondary | ICD-10-CM | POA: Insufficient documentation

## 2024-06-24 DIAGNOSIS — G8191 Hemiplegia, unspecified affecting right dominant side: Secondary | ICD-10-CM | POA: Diagnosis not present

## 2024-06-24 DIAGNOSIS — R400 Somnolence: Secondary | ICD-10-CM | POA: Diagnosis not present

## 2024-06-24 DIAGNOSIS — R29818 Other symptoms and signs involving the nervous system: Secondary | ICD-10-CM | POA: Diagnosis not present

## 2024-06-24 DIAGNOSIS — K117 Disturbances of salivary secretion: Secondary | ICD-10-CM | POA: Diagnosis not present

## 2024-06-24 DIAGNOSIS — R531 Weakness: Secondary | ICD-10-CM | POA: Insufficient documentation

## 2024-06-24 DIAGNOSIS — I6523 Occlusion and stenosis of bilateral carotid arteries: Secondary | ICD-10-CM | POA: Diagnosis not present

## 2024-06-24 DIAGNOSIS — R1311 Dysphagia, oral phase: Secondary | ICD-10-CM | POA: Diagnosis not present

## 2024-06-24 DIAGNOSIS — S62356A Nondisplaced fracture of shaft of fifth metacarpal bone, right hand, initial encounter for closed fracture: Secondary | ICD-10-CM | POA: Diagnosis not present

## 2024-06-24 DIAGNOSIS — R41841 Cognitive communication deficit: Secondary | ICD-10-CM | POA: Diagnosis not present

## 2024-06-24 DIAGNOSIS — I6602 Occlusion and stenosis of left middle cerebral artery: Secondary | ICD-10-CM | POA: Diagnosis not present

## 2024-06-24 DIAGNOSIS — E162 Hypoglycemia, unspecified: Secondary | ICD-10-CM

## 2024-06-24 LAB — CBG MONITORING, ED
Glucose-Capillary: 64 mg/dL — ABNORMAL LOW (ref 70–99)
Glucose-Capillary: 130 mg/dL — ABNORMAL HIGH (ref 70–99)

## 2024-06-24 LAB — DIFFERENTIAL
Abs Immature Granulocytes: 0.02 K/uL (ref 0.00–0.07)
Basophils Absolute: 0.1 K/uL (ref 0.0–0.1)
Basophils Relative: 2 %
Eosinophils Absolute: 0.1 K/uL (ref 0.0–0.5)
Eosinophils Relative: 5 %
Immature Granulocytes: 1 %
Lymphocytes Relative: 34 %
Lymphs Abs: 1.1 K/uL (ref 0.7–4.0)
Monocytes Absolute: 0.3 K/uL (ref 0.1–1.0)
Monocytes Relative: 11 %
Neutro Abs: 1.5 K/uL — ABNORMAL LOW (ref 1.7–7.7)
Neutrophils Relative %: 47 %

## 2024-06-24 LAB — URINALYSIS, ROUTINE W REFLEX MICROSCOPIC
Bilirubin Urine: NEGATIVE
Glucose, UA: NEGATIVE mg/dL
Hgb urine dipstick: NEGATIVE
Ketones, ur: NEGATIVE mg/dL
Leukocytes,Ua: NEGATIVE
Nitrite: NEGATIVE
Protein, ur: NEGATIVE mg/dL
Specific Gravity, Urine: 1.014 (ref 1.005–1.030)
pH: 7 (ref 5.0–8.0)

## 2024-06-24 LAB — CBC
HCT: 35.6 % — ABNORMAL LOW (ref 39.0–52.0)
Hemoglobin: 11.6 g/dL — ABNORMAL LOW (ref 13.0–17.0)
MCH: 30 pg (ref 26.0–34.0)
MCHC: 32.6 g/dL (ref 30.0–36.0)
MCV: 92 fL (ref 80.0–100.0)
Platelets: 224 K/uL (ref 150–400)
RBC: 3.87 MIL/uL — ABNORMAL LOW (ref 4.22–5.81)
RDW: 13.2 % (ref 11.5–15.5)
WBC: 3.1 K/uL — ABNORMAL LOW (ref 4.0–10.5)
nRBC: 0 % (ref 0.0–0.2)

## 2024-06-24 LAB — COMPREHENSIVE METABOLIC PANEL WITH GFR
ALT: 17 U/L (ref 0–44)
AST: 19 U/L (ref 15–41)
Albumin: 3.4 g/dL — ABNORMAL LOW (ref 3.5–5.0)
Alkaline Phosphatase: 125 U/L (ref 38–126)
Anion gap: 10 (ref 5–15)
BUN: 22 mg/dL (ref 8–23)
CO2: 27 mmol/L (ref 22–32)
Calcium: 9.1 mg/dL (ref 8.9–10.3)
Chloride: 104 mmol/L (ref 98–111)
Creatinine, Ser: 1.48 mg/dL — ABNORMAL HIGH (ref 0.61–1.24)
GFR, Estimated: 46 mL/min — ABNORMAL LOW (ref >60)
Glucose, Bld: 84 mg/dL (ref 70–99)
Potassium: 4.5 mmol/L (ref 3.5–5.1)
Sodium: 141 mmol/L (ref 135–145)
Total Bilirubin: 0.6 mg/dL (ref 0.0–1.2)
Total Protein: 6.7 g/dL (ref 6.5–8.1)

## 2024-06-24 LAB — PROTIME-INR
INR: 1 (ref 0.8–1.2)
Prothrombin Time: 14 s (ref 11.4–15.2)

## 2024-06-24 LAB — AMMONIA: Ammonia: 14 umol/L (ref 9–35)

## 2024-06-24 MED ORDER — LACTATED RINGERS IV BOLUS
500.0000 mL | Freq: Once | INTRAVENOUS | Status: AC
Start: 2024-06-24 — End: 2024-06-25
  Administered 2024-06-25: 500 mL via INTRAVENOUS

## 2024-06-24 MED ORDER — LORAZEPAM 2 MG/ML IJ SOLN
1.0000 mg | Freq: Once | INTRAMUSCULAR | Status: AC
  Administered 2024-06-24: 1 mg via INTRAVENOUS
  Filled 2024-06-24: qty 1

## 2024-06-24 MED ORDER — IOHEXOL 350 MG/ML SOLN
75.0000 mL | Freq: Once | INTRAVENOUS | Status: AC | PRN
  Administered 2024-06-24: 75 mL via INTRAVENOUS

## 2024-06-24 MED ORDER — DEXTROSE 50 % IV SOLN
1.0000 | Freq: Once | INTRAVENOUS | Status: AC
  Administered 2024-06-24: 50 mL via INTRAVENOUS
  Filled 2024-06-24: qty 50

## 2024-06-24 MED ORDER — HALOPERIDOL LACTATE 5 MG/ML IJ SOLN: 2.0000 mg | Freq: Once | INTRAMUSCULAR | Status: DC

## 2024-06-24 MED ORDER — LORAZEPAM 2 MG/ML IJ SOLN
1.0000 mg | Freq: Once | INTRAMUSCULAR | Status: AC
  Administered 2024-06-24: 1 mg via INTRAVENOUS

## 2024-06-24 MED ORDER — HALOPERIDOL LACTATE 5 MG/ML IJ SOLN
2.0000 mg | Freq: Once | INTRAMUSCULAR | Status: AC
  Administered 2024-06-24: 2 mg via INTRAVENOUS
  Filled 2024-06-24: qty 1

## 2024-06-24 MED ORDER — LORAZEPAM 2 MG/ML IJ SOLN
0.5000 mg | Freq: Once | INTRAMUSCULAR | Status: AC
  Administered 2024-06-24: 0.5 mg via INTRAVENOUS
  Filled 2024-06-24: qty 1

## 2024-06-24 NOTE — ED Notes (Signed)
 Pt does not follow commands to complete appropriate NIH

## 2024-06-24 NOTE — ED Provider Notes (Signed)
 [place] EMERGENCY DEPARTMENT AT [facility] Provider Note   CSN: [id number] Arrival date & time: 06/24/24  1544     Patient presents with: Weakness   Chase Mccarthy is a 83 y.o. male.    Weakness Patient presents for weakness.  Medical history includes DM, HTN, CKD.  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  He reportedly had increased right-sided weakness and drooling.  This was reportedly noticed with physical therapy today.  Patient, himself, has no complaints at this time.  History per staff at nursing facility.  Today, patient was noted to have drooling from right side of mouth and right-sided weakness.  This is new since yesterday.  EMS was called due to concern of CVA.     Prior to Admission medications   Medication Sig Start Date End Date Taking? Authorizing Provider  acetaminophen  (TYLENOL ) 650 MG CR tablet Take 650 mg by mouth in the morning and at bedtime.    [provider]  aspirin  EC 81 MG tablet Take 1 tablet (81 mg total) by mouth daily. Swallow whole. 01/25/23   Jillian Buttery, MD  cyanocobalamin  (VITAMIN B12) 1000 MCG tablet Take 1 tablet (1,000 mcg total) by mouth daily. Follow with outpatient provider for follow up of B12 level. 12/06/23   Perri DELENA Meliton Mickey., MD  divalproex  (DEPAKOTE  SPRINKLE) 125 MG capsule Take 125 mg by mouth at bedtime.    [provider]  feeding supplement (ENSURE ENLIVE / ENSURE PLUS) LIQD Take 237 mLs by mouth 2 (two) times daily between meals. Patient not taking: Reported on 11/30/2023 01/24/23   Adhikari, Amrit, MD  finasteride (PROSCAR) 5 MG tablet Take 5 mg by mouth daily.    [provider]  melatonin 3 MG TABS tablet Take 3 mg by mouth at bedtime.    [provider]  methocarbamol  (ROBAXIN ) 500 MG tablet Take 500 mg by mouth daily at 12 noon.    [provider]  Multiple Vitamins-Minerals (MULTIVITAL PO) Take 1 tablet by mouth daily.    [provider]  Vitamin D, Cholecalciferol, 25 MCG (1000 UT) CAPS Take 1 tablet by mouth daily.    [provider]    Allergies: Patient has no known allergies.    Review of Systems  Unable to perform ROS: Dementia    Updated Vital Signs BP (!) 154/79   Pulse 75   Temp (!) 97.5 F (36.4 C) (Oral)   Resp (!) 22   SpO2 100%   Physical Exam Vitals and nursing note reviewed.  Constitutional:      General: He is not in acute distress.    Appearance: He is well-developed. He is not toxic-appearing or diaphoretic.  HENT:     Head: Normocephalic and atraumatic.     Right Ear: External ear normal.     Left Ear: External ear normal.     Nose: Nose normal.     Mouth/Throat:     Mouth: Mucous membranes are moist.     Comments: He does have some drooling, primarily on the right side. Eyes:     Extraocular Movements: Extraocular movements intact.     Conjunctiva/sclera: Conjunctivae normal.  Cardiovascular:     Rate and Rhythm: Normal rate and regular rhythm.  Pulmonary:     Effort: Pulmonary effort is normal. No respiratory distress.     Breath sounds: Normal breath sounds. No wheezing or rales.  Abdominal:     General: There is no  distension.     Palpations: Abdomen is soft.     Tenderness: There is no abdominal tenderness.  Musculoskeletal:        General: No swelling or deformity.     Cervical back: Normal range of motion and neck supple.  Skin:    General: Skin is warm and dry.     Coloration: Skin is not jaundiced or pale.  Neurological:     Mental Status: He is alert.     Comments: Slurred speech, right hemibody weakness  Psychiatric:        Mood and Affect: Mood normal.        Behavior: Behavior normal.     (all labs ordered are listed, but only abnormal results are displayed) Labs Reviewed  CBC - Abnormal; Notable for the following components:      Result Value   WBC 3.1 (*)    RBC 3.87 (*)    Hemoglobin 11.6 (*)    HCT 35.6 (*)    All other  components within normal limits  DIFFERENTIAL - Abnormal; Notable for the following components:   Neutro Abs 1.5 (*)    All other components within normal limits  COMPREHENSIVE METABOLIC PANEL WITH GFR - Abnormal; Notable for the following components:   Creatinine, Ser 1.48 (*)    Albumin 3.4 (*)    GFR, Estimated 46 (*)    All other components within normal limits  CBG MONITORING, ED - Abnormal; Notable for the following components:   Glucose-Capillary 64 (*)    All other components within normal limits  CBG MONITORING, ED - Abnormal; Notable for the following components:   Glucose-Capillary 130 (*)    All other components within normal limits  PROTIME-INR  URINALYSIS, ROUTINE W REFLEX MICROSCOPIC  AMMONIA  CBG MONITORING, ED    EKG: EKG Interpretation Date/Time:  Tuesday June 24 2024 17:34:51 EDT Ventricular Rate:  75 PR Interval:  214 QRS Duration:  102 QT Interval:  386 QTC Calculation: 432 R Axis:   48  Text Interpretation: Sinus rhythm Minimal ST elevation, anterior leads Artifact in lead(s) I II III aVR aVL aVF V1 V2 V3 V4 V5 V6 Confirmed by Melvenia Motto ([id number]) on 06/24/2024 6:26:05 PM  Radiology: CT ANGIO HEAD NECK W WO CM Result Date: 06/24/2024 EXAM: CTA HEAD AND NECK WITHOUT AND WITH 06/24/2024 07:43:29 PM TECHNIQUE: CTA of the head and neck was performed without and with the administration of intravenous contrast. 75 mL of iohexol (OMNIPAQUE) 350 MG/ML injection was administered. Multiplanar 2D and/or 3D reformatted images are provided for review. Automated exposure control, iterative reconstruction, and/or weight based adjustment of the mA/kV was utilized to reduce the radiation dose to as low as reasonably achievable. Stenosis of the internal carotid arteries measured using NASCET criteria. COMPARISON: None available CLINICAL HISTORY: Acute neuro deficit, stroke suspected, weakness. FINDINGS: Image quality is degraded by patient positioning (imaged in the lateral  decubitus position with prominent upper thoracic kyphosis) resulting in increased image noise. CTA NECK: AORTIC ARCH AND ARCH VESSELS: No dissection or arterial injury. No significant stenosis of the brachiocephalic or subclavian arteries. CERVICAL CAROTID ARTERIES: No dissection, arterial injury, or hemodynamically significant stenosis by NASCET criteria. CERVICAL VERTEBRAL ARTERIES: No dissection, arterial injury, or significant stenosis. LUNGS AND MEDIASTINUM: Unremarkable. SOFT TISSUES: 1.9 cm right thyroid  nodule. No acute abnormality. BONES: No acute abnormality. CTA HEAD: ANTERIOR CIRCULATION: The internal carotid arteries are patent from skull base to carotid termini with atherosclerotic irregularity and up to mild stenosis of both  carotid siphons. ACAs and MCAs are patent without evidence of a proximal branch occlusion. There is mild stenosis of the left M1 segment. No aneurysm. POSTERIOR CIRCULATION: The intracranial vertebral arteries are patent to the basilar artery. The basilar artery is patent with a mild to moderate stenosis versus artifact proximally. There are robust posterior communicating arteries bilaterally with hypoplastic right and absent left P1 segments. Both posterior cerebral arteries are patent without evidence of a significant proximal stenosis. No aneurysm. OTHER: No dural venous sinus thrombosis on this non-dedicated study. IMPRESSION: 1. No large vessel occlusion. 2. Mild stenoses of the intracranial ICAs and left M1 segment. 3. Mild to moderate stenosis versus artifact of the proximal basilar artery. 4. Widely patent cervical carotid arteries. 5. 1.9 cm right thyroid  nodule. Recommend nonemergent thyroid  ultrasound if clinically warranted considering the patient's age and comorbidities. Electronically signed by: Dasie Hamburg MD 06/24/2024 08:20 PM EDT RP Workstation: [id number]   CT HEAD WO CONTRAST Result Date: 06/24/2024 EXAM: CT HEAD WITHOUT CONTRAST 06/24/2024 04:34:21 PM  TECHNIQUE: CT of the head was performed without the administration of intravenous contrast. Automated exposure control, iterative reconstruction, and/or weight based adjustment of the mA/kV was utilized to reduce the radiation dose to as low as reasonably achievable. COMPARISON: CT head 03/30/2024. CLINICAL HISTORY: Neuro deficit, acute, stroke suspected. No contrast; Pt to ED from [facility] and rehabilitation with co more right sided weakness and drooling more than his usual per one of the therapists. PER EMS pt is at his baseline mentally and his speech. PT has deficits from a previous stroke. PER EMS pt was pacing all night which can be normal for him and then he tends to sleep all day. PER EMS when they arrived pt was sleeping and they had to wake him up. PER EMS pt speech is at baseline. A therapist was working with pt and felt like he was experiencing increased deficits to right side, grip strength. FINDINGS: LIMITATIONS/ARTIFACTS: Evaluation is slightly limited by artifact. BRAIN AND VENTRICLES: Mild age related volume loss. No acute hemorrhage. No evidence of acute infarct. No hydrocephalus. No extra-axial collection. No mass effect or midline shift. ORBITS: No acute abnormality. SINUSES: No acute abnormality. SOFT TISSUES AND SKULL: No acute soft tissue abnormality. No skull fracture. VASCULATURE: Atherosclerosis of the carotid siphons. IMPRESSION: 1. No acute intracranial abnormality. 2. Mild age-related volume loss. Electronically signed by: Donnice Mania MD 06/24/2024 04:54 PM EDT RP Workstation: [id number]     Procedures   Medications Ordered in the ED  lactated ringers  bolus 500 mL (has no administration in time range)  dextrose  50 % solution 50 mL (50 mLs Intravenous Given 06/24/24 1824)  iohexol (OMNIPAQUE) 350 MG/ML injection 75 mL (75 mLs Intravenous Contrast Given 06/24/24 1943)  haloperidol  lactate (HALDOL ) injection 2 mg (2 mg Intravenous Given 06/24/24 2001)  LORazepam   (ATIVAN ) injection 0.5 mg (0.5 mg Intravenous Given 06/24/24 2001)  LORazepam  (ATIVAN ) injection 1 mg (1 mg Intravenous Given 06/24/24 2317)  LORazepam  (ATIVAN ) injection 1 mg (1 mg Intravenous Given 06/24/24 2321)                                    Medical Decision Making Amount and/or Complexity of Data Reviewed Labs: ordered. Radiology: ordered.  Risk Prescription drug management.   This patient presents to the ED for concern of strokelike symptoms, this involves an extensive number of treatment options, and is a complaint that carries with  it a high risk of complications and morbidity.  The differential diagnosis includes CVA, TIA, metabolic derangements, neoplasm, seizure   Co morbidities / Chronic conditions that complicate the patient evaluation  DM, HTN, CKD, CVA   Additional history obtained:  Additional history obtained from EMR External records from outside source obtained and reviewed including EMS, staff at nursing facility   Lab Tests:  I Ordered, and personally interpreted labs.  The pertinent results include: Slight anemia, mild leukopenia, baseline creatinine, normal electrolytes   Imaging Studies ordered:  I ordered imaging studies including CT head, CTA head and neck, MRI brain I independently visualized and interpreted imaging which showed no acute findings on CT imaging.  MRI was pending at time of signout I agree with the radiologist interpretation   Cardiac Monitoring: / EKG:  The patient was maintained on a cardiac monitor.  I personally viewed and interpreted the cardiac monitored which showed an underlying rhythm of: Sinus rhythm   Problem List / ED Course / Critical interventions / Medication management  Patient presenting from skilled nursing facility for concern of acute on chronic right-sided weakness.  Patient, himself, is a poor historian.  He denies any complaints currently.  On exam, he does have some mild right-sided weakness.  He  does have some drooling from the right side of his mouth.  Per chart review, he saw neurology 7 years ago for neck pain.  I do not see CVA listed in EMR.  Workup was initiated.  CT imaging did not show any acute findings.  MRI brain was ordered.  Due to patient's mild agitation, dose of Haldol  and Ativan  were ordered.  Initial attempted MRI was unsuccessful.  On return to the ED, patient is sleeping comfortably.  Will reattempt.  Given questionable stroke history, I did reach out to patient's nursing facility.  Nursing facility states that the drooling and right-sided deficits are new since yesterday.  Last known well time would have been last night.  On repeat attempt of MRI, patient was unable to lay flat.  On his return, he remains somnolent but does have flexion in hips and knees.  He is also flexing his neck.  2 mg of Ativan  was given.  Patient was positioned on his back with a wedge of blankets under his legs.  At this time, he is able to lay flat with appropriate wedging pillows.  MRI was notified.  Third attempt of MRI was pending at signout.  Care of patient was signed to oncoming ED provider. I ordered medication including IV fluids for hydration, Haldol  Ativan  for anxiolysis Reevaluation of the patient after these medicines showed that the patient improved I have reviewed the patients home medicines and have made adjustments as needed   Consultations Obtained:  I requested consultation with the neurologist, Dr. Voncile,  and discussed lab and imaging findings as well as pertinent plan - they recommend: Additional sedation and wedging pillows for MRI.   Social Determinants of Health:  Resides in nursing facility     Final diagnoses:  Stroke-like symptoms    ED Discharge Orders     None          Melvenia Motto, MD 06/24/24 2333

## 2024-06-24 NOTE — ED Triage Notes (Addendum)
 Pt to ED from [facility] and rehabilitation with co more right sided weakness and drooling more than his usual per one of the therapists.  PER EMS pt is at his baseline mentally and his speech.  PT has deficits from a previous stroke. PER EMS pt was pacing all night which can be normal for him and then he tends to sleep all day. PER EMS when they arrived pt was sleeping and they had to wake him up. PER Ems pt speech is at baseline. A therapist was working with pt and felt like he was experiencing increased deficits to right side, grip strength.

## 2024-06-24 NOTE — ED Notes (Signed)
PT brief changed at this time.

## 2024-06-24 NOTE — ED Notes (Signed)
 NT attempting to get blood work.

## 2024-06-25 ENCOUNTER — Ambulatory Visit: Admitting: Diagnostic Neuroimaging

## 2024-06-25 DIAGNOSIS — R41841 Cognitive communication deficit: Secondary | ICD-10-CM | POA: Diagnosis not present

## 2024-06-25 DIAGNOSIS — G9340 Encephalopathy, unspecified: Secondary | ICD-10-CM | POA: Diagnosis not present

## 2024-06-25 DIAGNOSIS — R29818 Other symptoms and signs involving the nervous system: Secondary | ICD-10-CM | POA: Diagnosis not present

## 2024-06-25 DIAGNOSIS — M6281 Muscle weakness (generalized): Secondary | ICD-10-CM | POA: Diagnosis not present

## 2024-06-25 DIAGNOSIS — I959 Hypotension, unspecified: Secondary | ICD-10-CM | POA: Diagnosis not present

## 2024-06-25 DIAGNOSIS — R531 Weakness: Secondary | ICD-10-CM | POA: Diagnosis not present

## 2024-06-25 DIAGNOSIS — R1311 Dysphagia, oral phase: Secondary | ICD-10-CM | POA: Diagnosis not present

## 2024-06-25 DIAGNOSIS — K117 Disturbances of salivary secretion: Secondary | ICD-10-CM | POA: Diagnosis not present

## 2024-06-25 DIAGNOSIS — G459 Transient cerebral ischemic attack, unspecified: Secondary | ICD-10-CM | POA: Diagnosis not present

## 2024-06-25 DIAGNOSIS — I6782 Cerebral ischemia: Secondary | ICD-10-CM | POA: Diagnosis not present

## 2024-06-25 DIAGNOSIS — Z7401 Bed confinement status: Secondary | ICD-10-CM | POA: Diagnosis not present

## 2024-06-25 DIAGNOSIS — R9089 Other abnormal findings on diagnostic imaging of central nervous system: Secondary | ICD-10-CM | POA: Diagnosis not present

## 2024-06-25 NOTE — ED Notes (Addendum)
 This RN at bedside. Unable to assess cognition of the pt. Pt's eyes are closed and not opening when his name is called. Pt closed his eyes tightly after I assess his pupils.

## 2024-06-25 NOTE — ED Provider Notes (Signed)
 I assumed care of this patient from previous provider.  Please see their note for further details of history, exam, and MDM.   Briefly patient is a 83 y.o. male who presented R/S weakness pending MRI.SABRA  MRI w/o acute stroke.  On reassessment, patient has good strength to BUE and BLE, relatively symmetric. Rest of w/u reassuring other than initial low BS.  The patient appears reasonably screened and/or stabilized for discharge and I doubt any other medical condition or other [facility] requiring further screening, evaluation, or treatment in the ED at this time. I have discussed the findings, Dx and Tx plan with the patient/family who expressed understanding and agree(s) with the plan. Discharge instructions discussed at length. The patient/family was given strict return precautions who verbalized understanding of the instructions. No further questions at time of discharge.  Disposition: Discharge  Condition: Good  ED Discharge Orders     None        Follow Up: Jobie Senior, FNP [address] [place] [address] [phone]  Call  to schedule an appointment for close follow up        Tryone Kille, Raynell Moder, MD 06/25/24 [phone]

## 2024-06-25 NOTE — ED Notes (Signed)
 [facility] called for transport.

## 2024-06-26 DIAGNOSIS — G9340 Encephalopathy, unspecified: Secondary | ICD-10-CM | POA: Diagnosis not present

## 2024-06-26 DIAGNOSIS — F03C11 Unspecified dementia, severe, with agitation: Secondary | ICD-10-CM | POA: Diagnosis not present

## 2024-06-26 DIAGNOSIS — R1311 Dysphagia, oral phase: Secondary | ICD-10-CM | POA: Diagnosis not present

## 2024-06-26 DIAGNOSIS — R41841 Cognitive communication deficit: Secondary | ICD-10-CM | POA: Diagnosis not present

## 2024-06-26 DIAGNOSIS — E43 Unspecified severe protein-calorie malnutrition: Secondary | ICD-10-CM | POA: Diagnosis not present

## 2024-06-26 DIAGNOSIS — G9341 Metabolic encephalopathy: Secondary | ICD-10-CM | POA: Diagnosis not present

## 2024-06-26 DIAGNOSIS — N183 Chronic kidney disease, stage 3 unspecified: Secondary | ICD-10-CM | POA: Diagnosis not present

## 2024-06-26 DIAGNOSIS — I10 Essential (primary) hypertension: Secondary | ICD-10-CM | POA: Diagnosis not present

## 2024-06-26 DIAGNOSIS — F03B0 Unspecified dementia, moderate, without behavioral disturbance, psychotic disturbance, mood disturbance, and anxiety: Secondary | ICD-10-CM | POA: Diagnosis not present

## 2024-06-26 DIAGNOSIS — S62348D Nondisplaced fracture of base of other metacarpal bone, subsequent encounter for fracture with routine healing: Secondary | ICD-10-CM | POA: Diagnosis not present

## 2024-06-26 DIAGNOSIS — M6281 Muscle weakness (generalized): Secondary | ICD-10-CM | POA: Diagnosis not present

## 2024-06-26 DIAGNOSIS — G8191 Hemiplegia, unspecified affecting right dominant side: Secondary | ICD-10-CM | POA: Diagnosis not present

## 2024-06-26 DIAGNOSIS — E119 Type 2 diabetes mellitus without complications: Secondary | ICD-10-CM | POA: Diagnosis not present

## 2024-06-26 DIAGNOSIS — I69391 Dysphagia following cerebral infarction: Secondary | ICD-10-CM | POA: Diagnosis not present

## 2024-06-27 DIAGNOSIS — M6281 Muscle weakness (generalized): Secondary | ICD-10-CM | POA: Diagnosis not present

## 2024-06-27 DIAGNOSIS — G9341 Metabolic encephalopathy: Secondary | ICD-10-CM | POA: Diagnosis not present

## 2024-06-27 DIAGNOSIS — E119 Type 2 diabetes mellitus without complications: Secondary | ICD-10-CM | POA: Diagnosis not present

## 2024-06-27 DIAGNOSIS — I10 Essential (primary) hypertension: Secondary | ICD-10-CM | POA: Diagnosis not present

## 2024-06-27 DIAGNOSIS — N183 Chronic kidney disease, stage 3 unspecified: Secondary | ICD-10-CM | POA: Diagnosis not present

## 2024-06-27 DIAGNOSIS — N138 Other obstructive and reflux uropathy: Secondary | ICD-10-CM | POA: Diagnosis not present

## 2024-06-27 DIAGNOSIS — G9340 Encephalopathy, unspecified: Secondary | ICD-10-CM | POA: Diagnosis not present

## 2024-06-27 DIAGNOSIS — I69391 Dysphagia following cerebral infarction: Secondary | ICD-10-CM | POA: Diagnosis not present

## 2024-06-27 DIAGNOSIS — R1311 Dysphagia, oral phase: Secondary | ICD-10-CM | POA: Diagnosis not present

## 2024-06-27 DIAGNOSIS — F03C11 Unspecified dementia, severe, with agitation: Secondary | ICD-10-CM | POA: Diagnosis not present

## 2024-06-27 DIAGNOSIS — G8191 Hemiplegia, unspecified affecting right dominant side: Secondary | ICD-10-CM | POA: Diagnosis not present

## 2024-06-27 DIAGNOSIS — R41841 Cognitive communication deficit: Secondary | ICD-10-CM | POA: Diagnosis not present

## 2024-06-28 DIAGNOSIS — I69391 Dysphagia following cerebral infarction: Secondary | ICD-10-CM | POA: Diagnosis not present

## 2024-06-28 DIAGNOSIS — M6281 Muscle weakness (generalized): Secondary | ICD-10-CM | POA: Diagnosis not present

## 2024-06-28 DIAGNOSIS — G9341 Metabolic encephalopathy: Secondary | ICD-10-CM | POA: Diagnosis not present

## 2024-06-28 DIAGNOSIS — R1311 Dysphagia, oral phase: Secondary | ICD-10-CM | POA: Diagnosis not present

## 2024-06-28 DIAGNOSIS — N183 Chronic kidney disease, stage 3 unspecified: Secondary | ICD-10-CM | POA: Diagnosis not present

## 2024-06-28 DIAGNOSIS — G8191 Hemiplegia, unspecified affecting right dominant side: Secondary | ICD-10-CM | POA: Diagnosis not present

## 2024-06-28 DIAGNOSIS — N138 Other obstructive and reflux uropathy: Secondary | ICD-10-CM | POA: Diagnosis not present

## 2024-06-28 DIAGNOSIS — F03C11 Unspecified dementia, severe, with agitation: Secondary | ICD-10-CM | POA: Diagnosis not present

## 2024-06-28 DIAGNOSIS — I10 Essential (primary) hypertension: Secondary | ICD-10-CM | POA: Diagnosis not present

## 2024-06-28 DIAGNOSIS — E119 Type 2 diabetes mellitus without complications: Secondary | ICD-10-CM | POA: Diagnosis not present

## 2024-06-28 DIAGNOSIS — R41841 Cognitive communication deficit: Secondary | ICD-10-CM | POA: Diagnosis not present

## 2024-06-30 DIAGNOSIS — R1311 Dysphagia, oral phase: Secondary | ICD-10-CM | POA: Diagnosis not present

## 2024-06-30 DIAGNOSIS — M6281 Muscle weakness (generalized): Secondary | ICD-10-CM | POA: Diagnosis not present

## 2024-06-30 DIAGNOSIS — R41841 Cognitive communication deficit: Secondary | ICD-10-CM | POA: Diagnosis not present

## 2024-06-30 DIAGNOSIS — G9340 Encephalopathy, unspecified: Secondary | ICD-10-CM | POA: Diagnosis not present

## 2024-07-01 DIAGNOSIS — R1311 Dysphagia, oral phase: Secondary | ICD-10-CM | POA: Diagnosis not present

## 2024-07-01 DIAGNOSIS — R41841 Cognitive communication deficit: Secondary | ICD-10-CM | POA: Diagnosis not present

## 2024-07-01 DIAGNOSIS — M6281 Muscle weakness (generalized): Secondary | ICD-10-CM | POA: Diagnosis not present

## 2024-07-02 DIAGNOSIS — M6281 Muscle weakness (generalized): Secondary | ICD-10-CM | POA: Diagnosis not present

## 2024-07-02 DIAGNOSIS — R1311 Dysphagia, oral phase: Secondary | ICD-10-CM | POA: Diagnosis not present

## 2024-07-02 DIAGNOSIS — R41841 Cognitive communication deficit: Secondary | ICD-10-CM | POA: Diagnosis not present

## 2024-07-02 DIAGNOSIS — G9340 Encephalopathy, unspecified: Secondary | ICD-10-CM | POA: Diagnosis not present

## 2024-07-03 DIAGNOSIS — N183 Chronic kidney disease, stage 3 unspecified: Secondary | ICD-10-CM | POA: Diagnosis not present

## 2024-07-03 DIAGNOSIS — G9340 Encephalopathy, unspecified: Secondary | ICD-10-CM | POA: Diagnosis not present

## 2024-07-03 DIAGNOSIS — E119 Type 2 diabetes mellitus without complications: Secondary | ICD-10-CM | POA: Diagnosis not present

## 2024-07-03 DIAGNOSIS — M6281 Muscle weakness (generalized): Secondary | ICD-10-CM | POA: Diagnosis not present

## 2024-07-03 DIAGNOSIS — G8191 Hemiplegia, unspecified affecting right dominant side: Secondary | ICD-10-CM | POA: Diagnosis not present

## 2024-07-03 DIAGNOSIS — G9341 Metabolic encephalopathy: Secondary | ICD-10-CM | POA: Diagnosis not present

## 2024-07-03 DIAGNOSIS — N138 Other obstructive and reflux uropathy: Secondary | ICD-10-CM | POA: Diagnosis not present

## 2024-07-03 DIAGNOSIS — I69391 Dysphagia following cerebral infarction: Secondary | ICD-10-CM | POA: Diagnosis not present

## 2024-07-03 DIAGNOSIS — R41841 Cognitive communication deficit: Secondary | ICD-10-CM | POA: Diagnosis not present

## 2024-07-03 DIAGNOSIS — F03C11 Unspecified dementia, severe, with agitation: Secondary | ICD-10-CM | POA: Diagnosis not present

## 2024-07-03 DIAGNOSIS — I10 Essential (primary) hypertension: Secondary | ICD-10-CM | POA: Diagnosis not present

## 2024-07-03 DIAGNOSIS — R1311 Dysphagia, oral phase: Secondary | ICD-10-CM | POA: Diagnosis not present

## 2024-07-04 DIAGNOSIS — G9340 Encephalopathy, unspecified: Secondary | ICD-10-CM | POA: Diagnosis not present

## 2024-07-04 DIAGNOSIS — M6281 Muscle weakness (generalized): Secondary | ICD-10-CM | POA: Diagnosis not present

## 2024-07-04 DIAGNOSIS — R1311 Dysphagia, oral phase: Secondary | ICD-10-CM | POA: Diagnosis not present

## 2024-07-04 DIAGNOSIS — R41841 Cognitive communication deficit: Secondary | ICD-10-CM | POA: Diagnosis not present

## 2024-07-07 DIAGNOSIS — R41841 Cognitive communication deficit: Secondary | ICD-10-CM | POA: Diagnosis not present

## 2024-07-07 DIAGNOSIS — M6281 Muscle weakness (generalized): Secondary | ICD-10-CM | POA: Diagnosis not present

## 2024-07-07 DIAGNOSIS — R1311 Dysphagia, oral phase: Secondary | ICD-10-CM | POA: Diagnosis not present

## 2024-07-08 DIAGNOSIS — G9340 Encephalopathy, unspecified: Secondary | ICD-10-CM | POA: Diagnosis not present

## 2024-07-08 DIAGNOSIS — R1311 Dysphagia, oral phase: Secondary | ICD-10-CM | POA: Diagnosis not present

## 2024-07-08 DIAGNOSIS — M6281 Muscle weakness (generalized): Secondary | ICD-10-CM | POA: Diagnosis not present

## 2024-07-08 DIAGNOSIS — R41841 Cognitive communication deficit: Secondary | ICD-10-CM | POA: Diagnosis not present

## 2024-07-09 DIAGNOSIS — R41841 Cognitive communication deficit: Secondary | ICD-10-CM | POA: Diagnosis not present

## 2024-07-09 DIAGNOSIS — R1311 Dysphagia, oral phase: Secondary | ICD-10-CM | POA: Diagnosis not present

## 2024-07-09 DIAGNOSIS — G9340 Encephalopathy, unspecified: Secondary | ICD-10-CM | POA: Diagnosis not present

## 2024-07-09 DIAGNOSIS — M6281 Muscle weakness (generalized): Secondary | ICD-10-CM | POA: Diagnosis not present

## 2024-07-10 ENCOUNTER — Ambulatory Visit: Admitting: Gastroenterology

## 2024-07-10 ENCOUNTER — Other Ambulatory Visit (HOSPITAL_COMMUNITY): Payer: Self-pay | Admitting: *Deleted

## 2024-07-10 ENCOUNTER — Encounter: Payer: Self-pay | Admitting: Gastroenterology

## 2024-07-10 VITALS — BP 92/60 | HR 62

## 2024-07-10 DIAGNOSIS — R41841 Cognitive communication deficit: Secondary | ICD-10-CM | POA: Diagnosis not present

## 2024-07-10 DIAGNOSIS — N138 Other obstructive and reflux uropathy: Secondary | ICD-10-CM | POA: Diagnosis not present

## 2024-07-10 DIAGNOSIS — E119 Type 2 diabetes mellitus without complications: Secondary | ICD-10-CM | POA: Diagnosis not present

## 2024-07-10 DIAGNOSIS — I69391 Dysphagia following cerebral infarction: Secondary | ICD-10-CM | POA: Diagnosis not present

## 2024-07-10 DIAGNOSIS — N183 Chronic kidney disease, stage 3 unspecified: Secondary | ICD-10-CM | POA: Diagnosis not present

## 2024-07-10 DIAGNOSIS — F03C11 Unspecified dementia, severe, with agitation: Secondary | ICD-10-CM | POA: Diagnosis not present

## 2024-07-10 DIAGNOSIS — R1310 Dysphagia, unspecified: Secondary | ICD-10-CM

## 2024-07-10 DIAGNOSIS — R194 Change in bowel habit: Secondary | ICD-10-CM | POA: Diagnosis not present

## 2024-07-10 DIAGNOSIS — R1312 Dysphagia, oropharyngeal phase: Secondary | ICD-10-CM

## 2024-07-10 DIAGNOSIS — G8191 Hemiplegia, unspecified affecting right dominant side: Secondary | ICD-10-CM | POA: Diagnosis not present

## 2024-07-10 DIAGNOSIS — R1311 Dysphagia, oral phase: Secondary | ICD-10-CM | POA: Diagnosis not present

## 2024-07-10 DIAGNOSIS — M6281 Muscle weakness (generalized): Secondary | ICD-10-CM | POA: Diagnosis not present

## 2024-07-10 DIAGNOSIS — I10 Essential (primary) hypertension: Secondary | ICD-10-CM | POA: Diagnosis not present

## 2024-07-10 DIAGNOSIS — G9341 Metabolic encephalopathy: Secondary | ICD-10-CM | POA: Diagnosis not present

## 2024-07-10 NOTE — Progress Notes (Signed)
 Chief Complaint:dysphagia Primary GI Doctor: Dr. 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    06/24/2024 patient presented to [facility] for increased right-sided weakness and drooling. CT imaging did not show any acute findings. On repeat attempt of MRI, patient was unable to lay flat. MRI with no acute intracranial abnormality. Mild age-related cerebral volume loss with chronic small vessel ischemic disease.On reassessment, patient has good strength to BUE and BLE, relatively symmetric. Rest of w/u reassuring other than initial low BS. D/C home.  Interval History Patient, himself, is a poor historian. Presents to appointment in wheelchair. Accompanied by his daughter. Patient is at [facility] and rehab facility since 2024. Patient presents with dysphagia his daughter states since April.  His daughter reports her father recently started on daily speech therapy few weeks ago which has helped. She also complains of him drooling and states he is taking a medication for this. She reports he pockets his food and eats only pureed foods with thickened liquids. Patient has no teeth. Appetite good.   She notes history of GERD but reports he has not been on any antiacids per her knowledge. No symptoms reported.  Daughter notes patient typically has regular BM daily. He will have occasional constipation and diarrhea. They have prn medications as needed. Daughter notes rehab told her they will give him OTC Miralax  for constipation. And antidiarrheals for diarrhea. No blood in stool.   Wt Readings from Last 3 Encounters:  03/30/24 150 lb (68 kg)  12/01/23 131 lb 14.4 oz (59.8 kg)  01/18/23 114 lb (51.7 kg)    Past Medical History:  Diagnosis Date   BPH (benign prostatic hyperplasia)     mild   Dermatitis    axillary   Diabetes mellitus (HCC)    Dyslipidemia    Hypertension    stable   Onychomycosis    Restrictive lung disease    Tobacco use    Tricuspid valve regurgitation     Past Surgical History:  Procedure Laterality Date   CARDIAC CATHETERIZATION  11/20/2003   normal coronaries   NM MYOCAR PERF WALL MOTION  03/27/2012   normal   US  ECHOCARDIOGRAPHY  09/23/2009   mild-mod LVH,EF =>55%,mild MVP,trace MR,mild to mod TR, AOV mildly sclerotic.    Current Outpatient Medications  Medication Sig Dispense Refill   acetaminophen  (TYLENOL ) 650 MG CR tablet Take 650 mg by mouth in the morning and at bedtime.     amLODipine  (NORVASC ) 5 MG tablet      aspirin  EC 81 MG tablet Take 1 tablet (81 mg total) by mouth daily. Swallow whole. 30 tablet 12   ATIVAN  0.5 MG tablet Take 0.5 mg by mouth once.     divalproex  (DEPAKOTE  SPRINKLE) 125 MG capsule Take 125 mg by mouth at bedtime.     finasteride (PROSCAR) 5 MG tablet Take 5 mg by mouth daily.     ipratropium-albuterol (DUONEB) 0.5-2.5 (3) MG/3ML SOLN      methocarbamol  (ROBAXIN ) 500 MG tablet Take 500 mg by mouth daily at 12 noon.     cyanocobalamin  (VITAMIN B12) 1000 MCG tablet Take 1 tablet (1,000 mcg total) by mouth daily. Follow with outpatient provider for follow up of B12 level. (Patient not taking: Reported on 07/10/2024)  feeding supplement (ENSURE ENLIVE / ENSURE PLUS) LIQD Take 237 mLs by mouth 2 (two) times daily between meals. (Patient not taking: Reported on 07/10/2024) 237 mL 12   melatonin 3 MG TABS tablet Take 3 mg by mouth at bedtime. (Patient not taking: Reported on 07/10/2024)     Multiple Vitamins-Minerals (MULTIVITAL PO) Take 1 tablet by mouth daily. (Patient not taking: Reported on 07/10/2024)     Vitamin D, Cholecalciferol, 25 MCG (1000 UT) CAPS Take 1 tablet by mouth daily. (Patient not taking: Reported on 07/10/2024)     No current facility-administered medications for this visit.    Allergies  as of 07/10/2024   (No Known Allergies)    Family History  Problem Relation Age of Onset   Heart failure Mother    Cancer Father     Review of Systems:    Constitutional: No weight loss, fever, chills, weakness or fatigue HEENT: Eyes: No change in vision               Ears, Nose, Throat:  No change in hearing or congestion Skin: No rash or itching Cardiovascular: No chest pain, chest pressure or palpitations   Respiratory: No SOB or cough Gastrointestinal: See HPI and otherwise negative Genitourinary: No dysuria or change in urinary frequency Neurological: No headache, dizziness or syncope Musculoskeletal: No new muscle or joint pain Hematologic: No bleeding or bruising Psychiatric: No history of depression or anxiety    Physical Exam:  Vital signs: BP 92/60   Pulse 62   Constitutional:   Pleasant male appears to be in NAD, speaks in low tone and hard to undersand Throat: Oral cavity and pharynx without inflammation, swelling or lesion. No teeth. Respiratory: Respirations even and unlabored. Lungs clear to auscultation bilaterally.   No wheezes, crackles, or rhonchi.  Cardiovascular: Normal S1, S2. Regular rate and rhythm. No peripheral edema, cyanosis or pallor.  Gastrointestinal:  Soft, nondistended, nontender. No rebound or guarding. Normal bowel sounds. Rectal:  Not performed.  Msk:  in wheelchair, right sided weakness.  Neurologic:  Alert  Skin:   Dry and intact without significant lesions or rashes.  RELEVANT LABS AND IMAGING: CBC    Latest Ref Rng & Units 06/24/2024    5:25 PM 03/22/2024   12:12 AM 12/23/2023    2:38 PM  CBC  WBC 4.0 - 10.5 K/uL 3.1  4.6  4.6   Hemoglobin 13.0 - 17.0 g/dL 88.3  89.8  87.1   Hematocrit 39.0 - 52.0 % 35.6  31.9  40.0   Platelets 150 - 400 K/uL 224  202  284      CMP     Latest Ref Rng & Units 06/24/2024    5:25 PM 03/22/2024   12:12 AM 12/23/2023    2:38 PM  CMP  Glucose 70 - 99 mg/dL 84  891  81   BUN 8 - 23 mg/dL 22  29   11    Creatinine 0.61 - 1.24 mg/dL 8.51  8.26  8.78   Sodium 135 - 145 mmol/L 141  142  141   Potassium 3.5 - 5.1 mmol/L 4.5  5.1  4.0   Chloride 98 - 111 mmol/L 104  108  104   CO2 22 - 32 mmol/L 27  27  25    Calcium  8.9 - 10.3 mg/dL 9.1  8.7  8.9   Total Protein 6.5 - 8.1 g/dL 6.7  6.5    Total Bilirubin 0.0 - 1.2 mg/dL 0.6  0.6  Alkaline Phos 38 - 126 U/L 125  109    AST 15 - 41 U/L 19  22    ALT 0 - 44 U/L 17  12       Lab Results  Component Value Date   TSH 1.193 11/30/2023   11/2012 colonoscopy -One 6 mm polyp in rectum and removed with hot snare.   Assessment: Encounter Diagnoses  Name Primary?   Oropharyngeal dysphagia Yes   Altered bowel habits     83 year old male patient who presents with oropharyngeal dysphagia. PMH of stroke? Consumes pureed diet. Currently working with speech therapy at rehab. No teeth. Will proceed with MBSS to rule out aspiration and evaluate for neurological or motility disorder. Dysphagia precautions given.   Cotninue bowel current bowel regimen as it works well. PRN meds as needed.  Plan: -MBSS with speech  -dysphagia precautions -continue speech therapy at rehab   Thank you for the courtesy of this consult. Please call me with any questions or concerns.   Jolie Strohecker, FNP-C [facility] 07/10/2024, 3:11 PM  Cc: Jobie Senior, FNP

## 2024-07-10 NOTE — Patient Instructions (Addendum)
 Constipation Can use OTC miralax  as needed Can add fiber to food  Dysphagia Pamphlet handed out  You have been scheduled for a Barium Esophogram at [facility] (1st floor of the hospital) on 07/31/24 at 11:00am. Please arrive 30 minutes prior to your appointment for registration. Make certain not to have anything to eat or drink 3 hours prior to your test. If you need to reschedule for any reason, please contact radiology at [phone] to do so. __________________________________________________________________ A barium swallow is an examination that concentrates on views of the esophagus. This tends to be a double contrast exam (barium and two liquids which, when combined, create a gas to distend the wall of the oesophagus) or single contrast (non-ionic iodine based). The study is usually tailored to your symptoms so a good history is essential. Attention is paid during the study to the form, structure and configuration of the esophagus, looking for functional disorders (such as aspiration, dysphagia, achalasia, motility and reflux) EXAMINATION You may be asked to change into a gown, depending on the type of swallow being performed. A radiologist and radiographer will perform the procedure. The radiologist will advise you of the type of contrast selected for your procedure and direct you during the exam. You will be asked to stand, sit or lie in several different positions and to hold a small amount of fluid in your mouth before being asked to swallow while the imaging is performed .In some instances you may be asked to swallow barium coated marshmallows to assess the motility of a solid food bolus. The exam can be recorded as a digital or video fluoroscopy procedure. POST PROCEDURE It will take 1-2 days for the barium to pass through your system. To facilitate this, it is important, unless otherwise directed, to increase your fluids for the next 24-48hrs and to resume your normal diet.   This test typically takes about 30 minutes to perform. __________________________________________________________________________________  _______________________________________________________  If your blood pressure at your visit was 140/90 or greater, please contact your primary care physician to follow up on this.  _______________________________________________________  If you are age 44 or older, your body mass index should be between 23-30. Your There is no height or weight on file to calculate BMI. If this is out of the aforementioned range listed, please consider follow up with your Primary Care Provider.  If you are age 47 or younger, your body mass index should be between 19-25. Your There is no height or weight on file to calculate BMI. If this is out of the aformentioned range listed, please consider follow up with your Primary Care Provider.   ________________________________________________________  The [facility] providers would like to encourage you to use MYCHART to communicate with providers for non-urgent requests or questions.  Due to long hold times on the telephone, sending your provider a message by [facility] may be a faster and more efficient way to get a response.  Please allow 48 business hours for a response.  Please remember that this is for non-urgent requests.  _______________________________________________________  [facility] is using a team-based approach to care.  Your team is made up of your doctor and two to three APPS. Our APPS (Nurse Practitioners and Physician Assistants) work with your physician to ensure care continuity for you. They are fully qualified to address your health concerns and develop a treatment plan. They communicate directly with your gastroenterologist to care for you. Seeing the Advanced Practice Practitioners on your physician's team can help you by  facilitating care more promptly, often allowing for earlier appointments,  access to diagnostic testing, procedures, and other specialty referrals.   Thank you for trusting me with your gastrointestinal care. Deanna May, FNP-C

## 2024-07-11 DIAGNOSIS — M6281 Muscle weakness (generalized): Secondary | ICD-10-CM | POA: Diagnosis not present

## 2024-07-11 DIAGNOSIS — R1311 Dysphagia, oral phase: Secondary | ICD-10-CM | POA: Diagnosis not present

## 2024-07-11 DIAGNOSIS — N138 Other obstructive and reflux uropathy: Secondary | ICD-10-CM | POA: Diagnosis not present

## 2024-07-11 DIAGNOSIS — S3131XA Laceration without foreign body of scrotum and testes, initial encounter: Secondary | ICD-10-CM | POA: Diagnosis not present

## 2024-07-11 DIAGNOSIS — F03C11 Unspecified dementia, severe, with agitation: Secondary | ICD-10-CM | POA: Diagnosis not present

## 2024-07-11 DIAGNOSIS — I10 Essential (primary) hypertension: Secondary | ICD-10-CM | POA: Diagnosis not present

## 2024-07-11 DIAGNOSIS — G9341 Metabolic encephalopathy: Secondary | ICD-10-CM | POA: Diagnosis not present

## 2024-07-11 DIAGNOSIS — N183 Chronic kidney disease, stage 3 unspecified: Secondary | ICD-10-CM | POA: Diagnosis not present

## 2024-07-11 DIAGNOSIS — G9340 Encephalopathy, unspecified: Secondary | ICD-10-CM | POA: Diagnosis not present

## 2024-07-11 DIAGNOSIS — I69391 Dysphagia following cerebral infarction: Secondary | ICD-10-CM | POA: Diagnosis not present

## 2024-07-11 DIAGNOSIS — R41841 Cognitive communication deficit: Secondary | ICD-10-CM | POA: Diagnosis not present

## 2024-07-11 DIAGNOSIS — G8191 Hemiplegia, unspecified affecting right dominant side: Secondary | ICD-10-CM | POA: Diagnosis not present

## 2024-07-11 DIAGNOSIS — E119 Type 2 diabetes mellitus without complications: Secondary | ICD-10-CM | POA: Diagnosis not present

## 2024-07-14 ENCOUNTER — Encounter: Payer: Self-pay | Admitting: Radiology

## 2024-07-14 DIAGNOSIS — G9340 Encephalopathy, unspecified: Secondary | ICD-10-CM | POA: Diagnosis not present

## 2024-07-14 DIAGNOSIS — R1312 Dysphagia, oropharyngeal phase: Secondary | ICD-10-CM | POA: Diagnosis not present

## 2024-07-14 DIAGNOSIS — R1311 Dysphagia, oral phase: Secondary | ICD-10-CM | POA: Diagnosis not present

## 2024-07-14 DIAGNOSIS — R2689 Other abnormalities of gait and mobility: Secondary | ICD-10-CM | POA: Diagnosis not present

## 2024-07-15 ENCOUNTER — Other Ambulatory Visit (HOSPITAL_COMMUNITY): Payer: Self-pay | Admitting: Gastroenterology

## 2024-07-15 DIAGNOSIS — R1310 Dysphagia, unspecified: Secondary | ICD-10-CM

## 2024-07-15 DIAGNOSIS — R059 Cough, unspecified: Secondary | ICD-10-CM

## 2024-07-15 DIAGNOSIS — G9340 Encephalopathy, unspecified: Secondary | ICD-10-CM | POA: Diagnosis not present

## 2024-07-15 DIAGNOSIS — R1311 Dysphagia, oral phase: Secondary | ICD-10-CM | POA: Diagnosis not present

## 2024-07-15 DIAGNOSIS — R1312 Dysphagia, oropharyngeal phase: Secondary | ICD-10-CM | POA: Diagnosis not present

## 2024-07-15 DIAGNOSIS — R2689 Other abnormalities of gait and mobility: Secondary | ICD-10-CM | POA: Diagnosis not present

## 2024-07-16 DIAGNOSIS — R2689 Other abnormalities of gait and mobility: Secondary | ICD-10-CM | POA: Diagnosis not present

## 2024-07-16 DIAGNOSIS — G9340 Encephalopathy, unspecified: Secondary | ICD-10-CM | POA: Diagnosis not present

## 2024-07-16 DIAGNOSIS — R1311 Dysphagia, oral phase: Secondary | ICD-10-CM | POA: Diagnosis not present

## 2024-07-16 DIAGNOSIS — R1312 Dysphagia, oropharyngeal phase: Secondary | ICD-10-CM | POA: Diagnosis not present

## 2024-07-17 DIAGNOSIS — N183 Chronic kidney disease, stage 3 unspecified: Secondary | ICD-10-CM | POA: Diagnosis not present

## 2024-07-17 DIAGNOSIS — I69391 Dysphagia following cerebral infarction: Secondary | ICD-10-CM | POA: Diagnosis not present

## 2024-07-17 DIAGNOSIS — N138 Other obstructive and reflux uropathy: Secondary | ICD-10-CM | POA: Diagnosis not present

## 2024-07-17 DIAGNOSIS — R1312 Dysphagia, oropharyngeal phase: Secondary | ICD-10-CM | POA: Diagnosis not present

## 2024-07-17 DIAGNOSIS — G8191 Hemiplegia, unspecified affecting right dominant side: Secondary | ICD-10-CM | POA: Diagnosis not present

## 2024-07-17 DIAGNOSIS — F03C11 Unspecified dementia, severe, with agitation: Secondary | ICD-10-CM | POA: Diagnosis not present

## 2024-07-17 DIAGNOSIS — R2689 Other abnormalities of gait and mobility: Secondary | ICD-10-CM | POA: Diagnosis not present

## 2024-07-17 DIAGNOSIS — I10 Essential (primary) hypertension: Secondary | ICD-10-CM | POA: Diagnosis not present

## 2024-07-17 DIAGNOSIS — R1311 Dysphagia, oral phase: Secondary | ICD-10-CM | POA: Diagnosis not present

## 2024-07-17 DIAGNOSIS — E119 Type 2 diabetes mellitus without complications: Secondary | ICD-10-CM | POA: Diagnosis not present

## 2024-07-17 DIAGNOSIS — G9340 Encephalopathy, unspecified: Secondary | ICD-10-CM | POA: Diagnosis not present

## 2024-07-17 DIAGNOSIS — G9341 Metabolic encephalopathy: Secondary | ICD-10-CM | POA: Diagnosis not present

## 2024-07-18 DIAGNOSIS — R1311 Dysphagia, oral phase: Secondary | ICD-10-CM | POA: Diagnosis not present

## 2024-07-18 DIAGNOSIS — G9340 Encephalopathy, unspecified: Secondary | ICD-10-CM | POA: Diagnosis not present

## 2024-07-18 DIAGNOSIS — R1312 Dysphagia, oropharyngeal phase: Secondary | ICD-10-CM | POA: Diagnosis not present

## 2024-07-18 DIAGNOSIS — R2689 Other abnormalities of gait and mobility: Secondary | ICD-10-CM | POA: Diagnosis not present

## 2024-07-21 DIAGNOSIS — R2689 Other abnormalities of gait and mobility: Secondary | ICD-10-CM | POA: Diagnosis not present

## 2024-07-21 DIAGNOSIS — R1311 Dysphagia, oral phase: Secondary | ICD-10-CM | POA: Diagnosis not present

## 2024-07-21 DIAGNOSIS — R1312 Dysphagia, oropharyngeal phase: Secondary | ICD-10-CM | POA: Diagnosis not present

## 2024-07-21 DIAGNOSIS — G9340 Encephalopathy, unspecified: Secondary | ICD-10-CM | POA: Diagnosis not present

## 2024-07-22 DIAGNOSIS — R1311 Dysphagia, oral phase: Secondary | ICD-10-CM | POA: Diagnosis not present

## 2024-07-22 DIAGNOSIS — G9340 Encephalopathy, unspecified: Secondary | ICD-10-CM | POA: Diagnosis not present

## 2024-07-22 DIAGNOSIS — R2689 Other abnormalities of gait and mobility: Secondary | ICD-10-CM | POA: Diagnosis not present

## 2024-07-22 DIAGNOSIS — R1312 Dysphagia, oropharyngeal phase: Secondary | ICD-10-CM | POA: Diagnosis not present

## 2024-07-23 DIAGNOSIS — R1311 Dysphagia, oral phase: Secondary | ICD-10-CM | POA: Diagnosis not present

## 2024-07-23 DIAGNOSIS — R2689 Other abnormalities of gait and mobility: Secondary | ICD-10-CM | POA: Diagnosis not present

## 2024-07-23 DIAGNOSIS — G9340 Encephalopathy, unspecified: Secondary | ICD-10-CM | POA: Diagnosis not present

## 2024-07-23 DIAGNOSIS — R1312 Dysphagia, oropharyngeal phase: Secondary | ICD-10-CM | POA: Diagnosis not present

## 2024-07-24 DIAGNOSIS — G9340 Encephalopathy, unspecified: Secondary | ICD-10-CM | POA: Diagnosis not present

## 2024-07-24 DIAGNOSIS — R2689 Other abnormalities of gait and mobility: Secondary | ICD-10-CM | POA: Diagnosis not present

## 2024-07-24 DIAGNOSIS — R1312 Dysphagia, oropharyngeal phase: Secondary | ICD-10-CM | POA: Diagnosis not present

## 2024-07-24 DIAGNOSIS — R1311 Dysphagia, oral phase: Secondary | ICD-10-CM | POA: Diagnosis not present

## 2024-07-25 ENCOUNTER — Telehealth: Payer: Self-pay | Admitting: Gastroenterology

## 2024-07-25 DIAGNOSIS — R2689 Other abnormalities of gait and mobility: Secondary | ICD-10-CM | POA: Diagnosis not present

## 2024-07-25 DIAGNOSIS — R1312 Dysphagia, oropharyngeal phase: Secondary | ICD-10-CM | POA: Diagnosis not present

## 2024-07-25 DIAGNOSIS — G9340 Encephalopathy, unspecified: Secondary | ICD-10-CM | POA: Diagnosis not present

## 2024-07-25 DIAGNOSIS — R1311 Dysphagia, oral phase: Secondary | ICD-10-CM | POA: Diagnosis not present

## 2024-07-25 NOTE — Telephone Encounter (Signed)
 Inbound call from patients daughter stating that she had tried to get records from [facility] and they would not release the records we need for her. She is requesting that we reach out and request the records. Please advise.

## 2024-07-28 DIAGNOSIS — R1311 Dysphagia, oral phase: Secondary | ICD-10-CM | POA: Diagnosis not present

## 2024-07-28 DIAGNOSIS — R1312 Dysphagia, oropharyngeal phase: Secondary | ICD-10-CM | POA: Diagnosis not present

## 2024-07-28 DIAGNOSIS — R2689 Other abnormalities of gait and mobility: Secondary | ICD-10-CM | POA: Diagnosis not present

## 2024-07-28 DIAGNOSIS — G9340 Encephalopathy, unspecified: Secondary | ICD-10-CM | POA: Diagnosis not present

## 2024-07-29 DIAGNOSIS — R1312 Dysphagia, oropharyngeal phase: Secondary | ICD-10-CM | POA: Diagnosis not present

## 2024-07-29 DIAGNOSIS — G9340 Encephalopathy, unspecified: Secondary | ICD-10-CM | POA: Diagnosis not present

## 2024-07-29 DIAGNOSIS — R1311 Dysphagia, oral phase: Secondary | ICD-10-CM | POA: Diagnosis not present

## 2024-07-29 DIAGNOSIS — R2689 Other abnormalities of gait and mobility: Secondary | ICD-10-CM | POA: Diagnosis not present

## 2024-07-30 DIAGNOSIS — G9340 Encephalopathy, unspecified: Secondary | ICD-10-CM | POA: Diagnosis not present

## 2024-07-30 DIAGNOSIS — R1311 Dysphagia, oral phase: Secondary | ICD-10-CM | POA: Diagnosis not present

## 2024-07-30 DIAGNOSIS — R2689 Other abnormalities of gait and mobility: Secondary | ICD-10-CM | POA: Diagnosis not present

## 2024-07-30 DIAGNOSIS — R1312 Dysphagia, oropharyngeal phase: Secondary | ICD-10-CM | POA: Diagnosis not present

## 2024-07-31 ENCOUNTER — Encounter (HOSPITAL_COMMUNITY)

## 2024-07-31 DIAGNOSIS — R1311 Dysphagia, oral phase: Secondary | ICD-10-CM | POA: Diagnosis not present

## 2024-07-31 DIAGNOSIS — I69391 Dysphagia following cerebral infarction: Secondary | ICD-10-CM | POA: Diagnosis not present

## 2024-07-31 DIAGNOSIS — G8191 Hemiplegia, unspecified affecting right dominant side: Secondary | ICD-10-CM | POA: Diagnosis not present

## 2024-07-31 DIAGNOSIS — N183 Chronic kidney disease, stage 3 unspecified: Secondary | ICD-10-CM | POA: Diagnosis not present

## 2024-07-31 DIAGNOSIS — R1312 Dysphagia, oropharyngeal phase: Secondary | ICD-10-CM | POA: Diagnosis not present

## 2024-07-31 DIAGNOSIS — G9340 Encephalopathy, unspecified: Secondary | ICD-10-CM | POA: Diagnosis not present

## 2024-07-31 DIAGNOSIS — E119 Type 2 diabetes mellitus without complications: Secondary | ICD-10-CM | POA: Diagnosis not present

## 2024-07-31 DIAGNOSIS — F03C11 Unspecified dementia, severe, with agitation: Secondary | ICD-10-CM | POA: Diagnosis not present

## 2024-07-31 DIAGNOSIS — R2689 Other abnormalities of gait and mobility: Secondary | ICD-10-CM | POA: Diagnosis not present

## 2024-07-31 DIAGNOSIS — G9341 Metabolic encephalopathy: Secondary | ICD-10-CM | POA: Diagnosis not present

## 2024-07-31 DIAGNOSIS — I10 Essential (primary) hypertension: Secondary | ICD-10-CM | POA: Diagnosis not present

## 2024-07-31 DIAGNOSIS — N138 Other obstructive and reflux uropathy: Secondary | ICD-10-CM | POA: Diagnosis not present

## 2024-08-01 DIAGNOSIS — R2689 Other abnormalities of gait and mobility: Secondary | ICD-10-CM | POA: Diagnosis not present

## 2024-08-01 DIAGNOSIS — R1312 Dysphagia, oropharyngeal phase: Secondary | ICD-10-CM | POA: Diagnosis not present

## 2024-08-01 DIAGNOSIS — G9340 Encephalopathy, unspecified: Secondary | ICD-10-CM | POA: Diagnosis not present

## 2024-08-01 DIAGNOSIS — R1311 Dysphagia, oral phase: Secondary | ICD-10-CM | POA: Diagnosis not present

## 2024-08-04 DIAGNOSIS — R1311 Dysphagia, oral phase: Secondary | ICD-10-CM | POA: Diagnosis not present

## 2024-08-04 DIAGNOSIS — R1312 Dysphagia, oropharyngeal phase: Secondary | ICD-10-CM | POA: Diagnosis not present

## 2024-08-04 DIAGNOSIS — R2689 Other abnormalities of gait and mobility: Secondary | ICD-10-CM | POA: Diagnosis not present

## 2024-08-06 DIAGNOSIS — R1311 Dysphagia, oral phase: Secondary | ICD-10-CM | POA: Diagnosis not present

## 2024-08-06 DIAGNOSIS — R2689 Other abnormalities of gait and mobility: Secondary | ICD-10-CM | POA: Diagnosis not present

## 2024-08-06 DIAGNOSIS — R1312 Dysphagia, oropharyngeal phase: Secondary | ICD-10-CM | POA: Diagnosis not present

## 2024-08-07 DIAGNOSIS — R2689 Other abnormalities of gait and mobility: Secondary | ICD-10-CM | POA: Diagnosis not present

## 2024-08-07 DIAGNOSIS — R1311 Dysphagia, oral phase: Secondary | ICD-10-CM | POA: Diagnosis not present

## 2024-08-07 DIAGNOSIS — G9340 Encephalopathy, unspecified: Secondary | ICD-10-CM | POA: Diagnosis not present

## 2024-08-07 DIAGNOSIS — R1312 Dysphagia, oropharyngeal phase: Secondary | ICD-10-CM | POA: Diagnosis not present

## 2024-08-08 DIAGNOSIS — G9340 Encephalopathy, unspecified: Secondary | ICD-10-CM | POA: Diagnosis not present

## 2024-08-08 DIAGNOSIS — R1311 Dysphagia, oral phase: Secondary | ICD-10-CM | POA: Diagnosis not present

## 2024-08-08 DIAGNOSIS — R2689 Other abnormalities of gait and mobility: Secondary | ICD-10-CM | POA: Diagnosis not present

## 2024-08-08 DIAGNOSIS — R1312 Dysphagia, oropharyngeal phase: Secondary | ICD-10-CM | POA: Diagnosis not present

## 2024-08-10 DIAGNOSIS — R1311 Dysphagia, oral phase: Secondary | ICD-10-CM | POA: Diagnosis not present

## 2024-08-10 DIAGNOSIS — R2689 Other abnormalities of gait and mobility: Secondary | ICD-10-CM | POA: Diagnosis not present

## 2024-08-10 DIAGNOSIS — R1312 Dysphagia, oropharyngeal phase: Secondary | ICD-10-CM | POA: Diagnosis not present

## 2024-08-13 ENCOUNTER — Encounter (HOSPITAL_COMMUNITY)

## 2024-08-13 DIAGNOSIS — F03B0 Unspecified dementia, moderate, without behavioral disturbance, psychotic disturbance, mood disturbance, and anxiety: Secondary | ICD-10-CM | POA: Diagnosis not present

## 2024-08-13 DIAGNOSIS — G9341 Metabolic encephalopathy: Secondary | ICD-10-CM | POA: Diagnosis not present

## 2024-08-13 DIAGNOSIS — R41841 Cognitive communication deficit: Secondary | ICD-10-CM | POA: Diagnosis not present

## 2024-08-26 ENCOUNTER — Telehealth: Payer: Self-pay | Admitting: Diagnostic Neuroimaging

## 2024-08-26 NOTE — Telephone Encounter (Signed)
 LVM and sent mychart msg informing pt of need to reschedule 09/17/24 appt - MD out   If patient calls back you can offer the 2pm slot with Dr Margaret on 09/17/24

## 2024-08-28 ENCOUNTER — Inpatient Hospital Stay (HOSPITAL_COMMUNITY): Admission: RE | Admit: 2024-08-28 | Discharge: 2024-08-28 | Attending: Urgent Care | Admitting: Urgent Care

## 2024-08-28 ENCOUNTER — Inpatient Hospital Stay (HOSPITAL_COMMUNITY): Admission: RE | Admit: 2024-08-28 | Discharge: 2024-08-28 | Attending: Gastroenterology

## 2024-08-28 DIAGNOSIS — R1314 Dysphagia, pharyngoesophageal phase: Secondary | ICD-10-CM | POA: Insufficient documentation

## 2024-08-28 DIAGNOSIS — R059 Cough, unspecified: Secondary | ICD-10-CM

## 2024-08-28 DIAGNOSIS — R1310 Dysphagia, unspecified: Secondary | ICD-10-CM

## 2024-08-28 DIAGNOSIS — F0390 Unspecified dementia without behavioral disturbance: Secondary | ICD-10-CM | POA: Diagnosis not present

## 2024-08-28 DIAGNOSIS — R1312 Dysphagia, oropharyngeal phase: Secondary | ICD-10-CM

## 2024-08-28 NOTE — Therapy (Addendum)
 Modified Barium Swallow Study  Patient Details  Name: Chase Mccarthy MRN: [id number] Date of Birth: 1941/03/25  Today's Date: 08/28/2024  Modified Barium Swallow completed.  Full report located under Chart Review in the Imaging Section.  History of Present Illness Chase Mccarthy is an 83 y.o. male who presented to this OP MBS from SNF, accompanied by his daughter as referred by OP GI to r/o aspiration and evaluate for neurological or motility disorder. He is followed for dysphagia by SLP at SNF. PMH: DM, HTN, tobacco use, dementia.   Clinical Impression Clinical Impression: Mr. Reppond presents with an oral dysphagia which appears to be secondary to cognitive impairment from dementia leading to him holding boluses in mouth, delayed initiation of swallow to the pyriform sinuses, poor awareness of boluses with instances of him talking during PO intake. He was not adequately cooperative and required encouragement to participate so full MBSimp was not completed. Pharyngeal phase of swallow appears WFL with no penetration or aspiration and only trace pharyngeal residuals s/p swallow initiation. 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    Factors that may increase risk of adverse event in presence of aspiration Chase Mccarthy 2021): Reduced cognitive function;Limited mobility  DIGEST Swallow Severity Rating*  Safety: 0  Efficiency:1  Overall Pharyngeal Swallow Severity: 1 1: mild; 2: moderate; 3: severe; 4: profound  *The Dynamic Imaging Grade of Swallowing Toxicity is standardized for the head and neck cancer population, however, demonstrates promising clinical applications across populations to standardize the clinical rating of pharyngeal swallow safety and severity.  Swallow Evaluation Recommendations Recommendations: PO diet PO Diet Recommendation: Dysphagia 1 (Pureed);Thin liquids  (Level 0);Dysphagia 2 (Finely chopped) Liquid Administration via: Cup Medication Administration: Other (Comment) Supervision: Full supervision/cueing for swallowing strategies Swallowing strategies  : Minimize environmental distractions;Slow rate;Small bites/sips Postural changes: Stay upright 30-60 min after meals;Position pt fully upright for meals      Norleen IVAR Blase, MA, CCC-SLP Speech Therapy  08/28/2024,2:19 PM

## 2024-08-29 ENCOUNTER — Encounter (HOSPITAL_COMMUNITY)

## 2024-09-01 ENCOUNTER — Telehealth: Payer: Self-pay | Admitting: Gastroenterology

## 2024-09-01 NOTE — Telephone Encounter (Signed)
 Spoke with patients daughter about MBSS results. His results showed cognitive impairment as well as cricopharyngeal bar . We discussed risks vs benefit of potentially doing EGD with dilatation which Kaydense Rizo or Peyson Postema not help with his current issues.With his age and being in wheelchair would have to be done at EGD. Will discuss will Dr. Charlanne as well. Pts daughter verbalizes understanding.

## 2024-09-17 ENCOUNTER — Ambulatory Visit: Admitting: Diagnostic Neuroimaging

## 2024-10-05 ENCOUNTER — Ambulatory Visit: Payer: Self-pay | Admitting: Gastroenterology

## 2024-10-07 NOTE — Telephone Encounter (Signed)
 Attempted to call patient's daughter. Mailbox full.SABRA

## 2024-10-08 ENCOUNTER — Other Ambulatory Visit: Payer: Self-pay

## 2024-10-08 DIAGNOSIS — R1312 Dysphagia, oropharyngeal phase: Secondary | ICD-10-CM

## 2024-10-08 DIAGNOSIS — R1310 Dysphagia, unspecified: Secondary | ICD-10-CM

## 2024-10-08 NOTE — Telephone Encounter (Signed)
 Pt daughter Isaiah was made aware of recommendations.  Isaiah requested that a referral be placed for the speech therapy.  Referral was placed in Epic. Allision was made aware.  Allision verbalized understanding with all questions answered.

## 2024-10-09 ENCOUNTER — Telehealth: Payer: Self-pay | Admitting: Diagnostic Neuroimaging

## 2024-10-09 NOTE — Telephone Encounter (Signed)
"   Request to reschedule appointment, with wait list. "

## 2024-11-05 ENCOUNTER — Ambulatory Visit: Admitting: Diagnostic Neuroimaging

## 2024-12-31 ENCOUNTER — Ambulatory Visit: Admitting: Diagnostic Neuroimaging
